# Patient Record
Sex: Male | Born: 1935 | Race: White | Hispanic: No | Smoking: Former smoker
Health system: Southern US, Community
[De-identification: ages and names within clinical notes are randomized; demographics above are authoritative.]

## PROBLEM LIST (undated history)

## (undated) DIAGNOSIS — J449 Chronic obstructive pulmonary disease, unspecified: Secondary | ICD-10-CM

## (undated) DIAGNOSIS — J849 Interstitial pulmonary disease, unspecified: Secondary | ICD-10-CM

## (undated) DIAGNOSIS — I10 Essential (primary) hypertension: Secondary | ICD-10-CM

## (undated) DIAGNOSIS — I639 Cerebral infarction, unspecified: Secondary | ICD-10-CM

## (undated) DIAGNOSIS — N400 Enlarged prostate without lower urinary tract symptoms: Secondary | ICD-10-CM

## (undated) DIAGNOSIS — E7800 Pure hypercholesterolemia, unspecified: Secondary | ICD-10-CM

## (undated) HISTORY — PX: HERNIA REPAIR: SHX51

## (undated) HISTORY — PX: CATARACT EXTRACTION: SUR2

## (undated) HISTORY — PX: APPENDECTOMY: SHX54

---

## 1998-06-25 ENCOUNTER — Other Ambulatory Visit: Admission: RE | Admit: 1998-06-25 | Discharge: 1998-06-25 | Payer: Self-pay | Admitting: Urology

## 1999-05-28 ENCOUNTER — Encounter: Admission: RE | Admit: 1999-05-28 | Discharge: 1999-05-28 | Payer: Self-pay | Admitting: Surgery

## 1999-05-28 ENCOUNTER — Encounter: Payer: Self-pay | Admitting: Surgery

## 1999-05-29 ENCOUNTER — Ambulatory Visit (HOSPITAL_BASED_OUTPATIENT_CLINIC_OR_DEPARTMENT_OTHER): Admission: RE | Admit: 1999-05-29 | Discharge: 1999-05-29 | Payer: Self-pay | Admitting: Surgery

## 2003-05-20 ENCOUNTER — Encounter: Admission: RE | Admit: 2003-05-20 | Discharge: 2003-05-20 | Payer: Self-pay | Admitting: Urology

## 2003-05-21 ENCOUNTER — Inpatient Hospital Stay (HOSPITAL_COMMUNITY): Admission: RE | Admit: 2003-05-21 | Discharge: 2003-05-23 | Payer: Self-pay | Admitting: Urology

## 2003-05-21 ENCOUNTER — Encounter (INDEPENDENT_AMBULATORY_CARE_PROVIDER_SITE_OTHER): Payer: Self-pay

## 2007-05-14 ENCOUNTER — Emergency Department (HOSPITAL_COMMUNITY): Admission: EM | Admit: 2007-05-14 | Discharge: 2007-05-14 | Payer: Self-pay | Admitting: Emergency Medicine

## 2007-05-15 ENCOUNTER — Emergency Department (HOSPITAL_COMMUNITY): Admission: EM | Admit: 2007-05-15 | Discharge: 2007-05-15 | Payer: Self-pay | Admitting: Emergency Medicine

## 2007-05-16 ENCOUNTER — Emergency Department (HOSPITAL_COMMUNITY): Admission: EM | Admit: 2007-05-16 | Discharge: 2007-05-16 | Payer: Self-pay | Admitting: Emergency Medicine

## 2007-05-17 ENCOUNTER — Inpatient Hospital Stay (HOSPITAL_COMMUNITY): Admission: EM | Admit: 2007-05-17 | Discharge: 2007-05-22 | Payer: Self-pay | Admitting: Emergency Medicine

## 2007-05-19 ENCOUNTER — Encounter (INDEPENDENT_AMBULATORY_CARE_PROVIDER_SITE_OTHER): Payer: Self-pay | Admitting: Internal Medicine

## 2007-07-12 ENCOUNTER — Encounter: Admission: RE | Admit: 2007-07-12 | Discharge: 2007-07-20 | Payer: Self-pay | Admitting: *Deleted

## 2007-07-25 ENCOUNTER — Encounter: Admission: RE | Admit: 2007-07-25 | Discharge: 2007-10-17 | Payer: Self-pay | Admitting: *Deleted

## 2007-11-21 ENCOUNTER — Encounter: Admission: RE | Admit: 2007-11-21 | Discharge: 2008-01-02 | Payer: Self-pay | Admitting: Neurology

## 2008-02-22 ENCOUNTER — Encounter: Admission: RE | Admit: 2008-02-22 | Discharge: 2008-04-03 | Payer: Self-pay | Admitting: Neurology

## 2009-01-27 ENCOUNTER — Encounter: Admission: RE | Admit: 2009-01-27 | Discharge: 2009-04-27 | Payer: Self-pay | Admitting: Family Medicine

## 2009-04-28 ENCOUNTER — Encounter: Admission: RE | Admit: 2009-04-28 | Discharge: 2009-05-12 | Payer: Self-pay | Admitting: Family Medicine

## 2010-09-23 ENCOUNTER — Ambulatory Visit: Payer: Medicare Other | Attending: Family Medicine

## 2010-09-23 DIAGNOSIS — M6281 Muscle weakness (generalized): Secondary | ICD-10-CM | POA: Insufficient documentation

## 2010-09-23 DIAGNOSIS — R262 Difficulty in walking, not elsewhere classified: Secondary | ICD-10-CM | POA: Insufficient documentation

## 2010-09-23 DIAGNOSIS — R269 Unspecified abnormalities of gait and mobility: Secondary | ICD-10-CM | POA: Insufficient documentation

## 2010-09-23 DIAGNOSIS — IMO0001 Reserved for inherently not codable concepts without codable children: Secondary | ICD-10-CM | POA: Insufficient documentation

## 2010-09-25 ENCOUNTER — Ambulatory Visit: Payer: Medicare Other

## 2010-09-30 ENCOUNTER — Ambulatory Visit: Payer: Medicare Other | Admitting: Physical Therapy

## 2010-10-02 ENCOUNTER — Ambulatory Visit: Payer: Medicare Other

## 2010-10-07 ENCOUNTER — Ambulatory Visit: Payer: Medicare Other | Admitting: Physical Therapy

## 2010-10-09 ENCOUNTER — Ambulatory Visit: Payer: Medicare Other

## 2010-10-13 ENCOUNTER — Ambulatory Visit: Payer: Medicare Other | Attending: Family Medicine

## 2010-10-13 DIAGNOSIS — R262 Difficulty in walking, not elsewhere classified: Secondary | ICD-10-CM | POA: Insufficient documentation

## 2010-10-13 DIAGNOSIS — IMO0001 Reserved for inherently not codable concepts without codable children: Secondary | ICD-10-CM | POA: Insufficient documentation

## 2010-10-13 DIAGNOSIS — R269 Unspecified abnormalities of gait and mobility: Secondary | ICD-10-CM | POA: Insufficient documentation

## 2010-10-13 DIAGNOSIS — M6281 Muscle weakness (generalized): Secondary | ICD-10-CM | POA: Insufficient documentation

## 2010-10-16 ENCOUNTER — Ambulatory Visit: Payer: Medicare Other | Admitting: Physical Therapy

## 2010-10-20 ENCOUNTER — Ambulatory Visit: Payer: Medicare Other | Admitting: Physical Therapy

## 2010-10-23 ENCOUNTER — Ambulatory Visit: Payer: Medicare Other | Admitting: Physical Therapy

## 2010-10-27 ENCOUNTER — Ambulatory Visit: Payer: Medicare Other | Admitting: Physical Therapy

## 2010-10-27 NOTE — H&P (Signed)
NAME:  Jim Contreras, Jim Contreras               ACCOUNT NO.:  [id number]   MEDICAL RECORD NO.:  [id number]          PATIENT TYPE:  INP   LOCATION:  1433                         FACILITY:  [facility]   PHYSICIAN:  Corinna L. Lendell Caprice, MDDATE OF BIRTH:  05-05-1924   DATE OF ADMISSION:  05/17/2007  DATE OF DISCHARGE:                              HISTORY & PHYSICAL   CHIEF COMPLAINT:  My legs gave out.   HPI:  Jim Contreras is a thin 77 year old white male who presents to the  emergency room via EMS with the above complaint.  He noted that he felt  weak when he woke up this morning.  He thinks that it was his right leg  that was weaker than his left.  He eased himself to the ground.  He  lives alone.  He has no history of stroke.  He reports that he got a  shot in Dr. Wanda Plump office and is currently on cephalexin.  He has  had a recent cystoscopy for hematuria and subsequently a Foley catheter  had to be placed for recurrent obstruction, reportedly secondary to  blood clots, according to the patient.  I have no office records.  The  patient denies any fevers, chills or cough.  He reports that his Foley  catheter was changed yesterday for a larger bore catheter by Dr.  Wanda Plump.  He has been eating and drinking well.  He is here with his  daughter.  He takes an aspirin a day.  He has no paresthesias.  No other  focal weakness.  No difficulty with speech.   PAST MEDICAL HISTORY:  1. BPH.  2. Recent cysto as above for hematuria.  3. Hypertension.   MEDICATIONS:  1. Cephalexin.  2. Hyzaar 100/12.5 mg a day.  3. Terazosin 10 mg a day.  4. Avodart  .  5. Aspirin 81 mg a day.  6. Fish oil capsules.   HE REPORTS AN ALLERGY TO PENICILLIN, SULFA AND NAPROSYN.   PAST SURGICAL HISTORY:  1. Appendectomy.  2. Some type of a prostate procedure which sounds like a TURP.   SOCIAL HISTORY:  He quit smoking in his 62s.  He lives alone.  He does  not have a history of heavy drinking or drugs.   FAMILY  HISTORY:  Negative for cancer, negative for stroke.   REVIEW OF SYSTEMS:  As above, otherwise negative.   Temperature is 99.1, blood pressure 144/63, pulse 87, respiratory rate  18, oxygen saturation 93% on room air.  GENERAL:  The patient is well-nourished, well-developed, in no acute  distress.  HEENT:  Normocephalic, atraumatic.  Pupils equal, round, and reactive to  light.  His face is symmetric.  Moist mucous membranes.  NECK:  Supple.  No carotid bruits, no JVD.  No thyromegaly.  LUNGS:  Clear to auscultation bilaterally without wheezes, rhonchi or  rales.  CARDIOVASCULAR:  Regular rate and rhythm without murmurs, gallops or  rubs.  ABDOMEN:  Soft, nontender, nondistended.  GU:  He has a leg bag attached to his Foley catheter draining pinkish  urine.  RECTAL:  Deferred.  EXTREMITIES:  No clubbing, cyanosis or edema.  NEUROLOGIC:  Cranial nerves are intact.  Motor strength 5/5 to my exam  but the ED physician felt that his right leg was weaker than his left.  The patient felt very dizzy when he sat up so I did not test his gait.  Deep tendon reflexes 2+.  Finger-to-nose normal.  SKIN:  No rash.  PSYCHIATRIC:  The patient is calm and cooperative with normal affect.   LABS:  White blood cell count is 18,000 with 93% neutrophils, 2%  lymphocytes, hemoglobin 12, hematocrit 34, platelet count 177.  PT/PTT  normal.  BUN is 29, creatinine 1.25, potassium 3.5, albumin 3, otherwise  unremarkable __________ .  CT of the brain shows no acute infarct.   ASSESSMENT AND PLAN:  1. Weakness with reported right leg weakness specifically:  The      patient will be admitted to telemetry.  I will get an MRI to rule      out stroke.  To my exam he does not have weakness, right greater      than left.  He appears just generally weak.  Also within the      differential is a sepsis, possibly a urinary source given his      recent Foley catheter and instrumentation as well as I suspect he      was  given antibiotic, maybe Rocephin.  I will check a UA C&S, also      get a PA and lateral chest x-ray.  I will start empirically with      Rocephin.  He will get physical therapy, occupational therapy.  I      will ask for office notes from Dr. Wanda Plump.  2. Hematuria:  For now hold Lovenox but I will give aspirin as he may      have suffered a stroke.  3. Benign prostatic hypertrophy and indwelling Foley that was recently      placed:  Continue Avodart and terazosin.  4. Hypertension:  Hold Hyzaar and for now.  5. Prerenal azotemia.  6. Leukocytosis, see above.      Corinna L. Lendell Caprice, MD  Electronically Signed     CLS/MEDQ  D:  05/17/2007  T:  05/18/2007  Job:  [id number]   cc:   Vikki Ports, M.D.  Fax: [phone]   [place] [employer], M.D.  Fax: [phone]

## 2010-10-27 NOTE — Discharge Summary (Signed)
NAME:  Jim Contreras, Jim Contreras               ACCOUNT NO.:  [id number]   MEDICAL RECORD NO.:  [id number]          PATIENT TYPE:  INP   LOCATION:  1433                         [facility]ACILITY:  [facility]   PHYSICIAN:  Kela Millin, M.D.DATE OF BIRTH:  1924/01/26   DATE OF ADMISSION:  05/17/2007  DATE OF DISCHARGE:  05/22/2007                               DISCHARGE SUMMARY   DISCHARGE DIAGNOSES:  1. Pseudomonas urinary tract infection with sepsis syndrome.  2. Transient atrial fibrillation - in sinus rhythm at the time of      discharge.  3. Hypertension.  4. Volume depletion/free renal azotemia - resolved with creatinine of      1.0 and BUN of 14 at the time of discharge.  5. History of hematuria - status post recent cystoscopy per Dr.      Wanda Plump.  6. History of benign prostatic hypertrophy.   PROCEDURE AND STUDIES:  1. A 2-D echocardiogram - overall left ventricular systolic function      normal, ejection fraction 65-70%.  No left ventricular regional      wall motion abnormalities.  LV wall thickness mildly increased.  2. 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  3. MRI of brain - atrophy and chronic small vessel disease.  No acute      or reversible process.   CONSULTATIONS:  Urology, Dr. Wanda Plump.   BRIEF HISTORY:  The patient is an 77 year old white male with the above-  listed medical problems who presented with complaints of weakness.  Initially in the ER he reported that he felt his right leg was weaker  than the left, and so a CT scan of the brain was done in the ER followed  by an MRI and the results as stated above.  It was also noted upon  admission that the patient had had a recent cystoscopy for hematuria and  subsequently a Foley catheter had been placed for recurrent obstruction  thought to be secondary to blood clots per patient's report.  A  urinalysis was done in the emergency room and it was  consistent with a  urinary tract infection.  He was admitted for further evaluation and  management.  He denied fevers, chills, cough.  He also denied  paresthesias and no difficulty with his speech.   Please see the full admission history and physical dictated on May 17, 2007 by Dr. Lendell Caprice for the details of the admission physical exam  as well as the laboratory data.   HOSPITAL COURSE:  #1  - PSEUDOMONAS URINARY TRACT INFECTION WITH SEPSIS  SYNDROME - upon admission the patient had a urinalysis done which was  turbid in appearance with positive nitrites and moderate leukocytes with  11-20 WBCs and many bacteria.  The patient was empirically started on IV  Rocephin after a urine culture was sent.  The urine grew Pseudomonas  that was pansensitive.  The patient has remained afebrile and it was  noted upon admission that he had a leukocytosis of 18.4 and this has  resolved - his last white cell count prior to discharge is 7.2.  The  patient has remained hemodynamically stable.  He has had no further  gross hematuria.  Urology has followed him in the hospital and she will  be discharged today on oral antibiotics.   #2 - TRANSIENT ATRIAL FIBRILLATION - on the patient's third hospital  day, his heart rate on the monitor was noted to increase to the 140s to  150s.  An EKG was done and it was consistent with atrial fibrillation  with rapid ventricular rhythm.  Serial cardiac enzymes were done and  these were negative for an MI.  A 2-D echocardiogram was also done and  the results as stated above.  Initially, the patient was started on  therapeutic dose of Lovenox as well as Cardizem drip.  With this  intervention, the patient spontaneously converted to normal sinus rhythm  while on the Cardizem drip, less than 12 hours after onset.  The IV  Cardizem was discontinued and the patient maintained on oral Cardizem.  He has remained asymptomatic and in normal sinus rhythm the rest of his   hospital stay.  I discussed the patient with Cardiology, and they stated  that this episode was in the setting of an acute illness - UTI with  sepsis syndrome as above, that chronic anticoagulation with Coumadin  would not be recommended at this time, but that the patient should  follow up in the Cardiology Clinic for further monitoring as  appropriate, and if the patient should go back into atrial fibrillation  Coumadin would be considered at that time.  The patient is to follow up  at the [facility].  He is discharged on oral Cardizem and  aspirin.   #3 - ACUTE RENAL AZOTEMIA/VOLUME DEPLETION - the patient was hydrated  during his hospital stay, resolved.   #4 - HISTORY OF HEMATURIA - per Neurology, Dr. Wanda Plump saw the patient  in the hospital and a voiding trial was done today, May 22, 2007,  and the patient has voided without difficulty.  He has not had any  further gross hematuria.  Dr. Wanda Plump today indicated that the patient  is to follow up with him in one week and a repeat cystoscopy to be done  at that time.   #5 - BPH - patient to continue his preadmission medications.   DISCHARGE MEDICATIONS:  1. Cardizem the CD 120 mg p.o. daily.  2. Cipro 250 mg p.o. b.i.d. times 10 days.  3. Hyzaar 100 mg p.o. daily.  4. HCTZ discontinued.  5. Aspirin 81 mg p.o. daily.  6. The patient to continue his terazosin and Avodart as previously.   FOLLOW-UP CARE:  1. Dr. Theresia Lo in 1-2 weeks.  2. [facility]/Dr. Eldridge Dace, patient to call for an appointment      upon discharge.  3. Dr. Wanda Plump in one week.   DISCHARGE SUMMARY:  Condition - improved.  Stable.      Kela Millin, M.D.  Electronically Signed     ACV/MEDQ  D:  05/22/2007  T:  05/22/2007  Job:  [id number]   cc:   Vikki Ports, M.D.  Fax: [phone]   Corky Crafts, MD  Fax: [phone]   [place] [employer], M.D.  Fax: [phone]

## 2010-10-29 ENCOUNTER — Ambulatory Visit: Payer: Medicare Other

## 2010-10-30 NOTE — Op Note (Signed)
NAME:  Jim Contreras, Jim Contreras                         ACCOUNT NO.:  [id number]   MEDICAL RECORD NO.:  [id number]                   PATIENT TYPE:  INP   LOCATION:  0371                                 FACILITY:  [facility]   PHYSICIAN:  [place] Service, M.D.             DATE OF BIRTH:  [birth year]   DATE OF PROCEDURE:  05/21/2003  DATE OF DISCHARGE:                                 OPERATIVE REPORT   LMD:  Al Decant. Janey Greaser, MD   UROLOGIST:  [place] Service, M.D.   PREOPERATIVE DIAGNOSES:  A 77 year old white male progressively enlarging  right hydrocele, symptomatic benign prostatic hypertrophy with episodes of  retention, cystoscopy confirms coapting lateral lobes of the prostate.   POSTOPERATIVE DIAGNOSES:  Same.   PROCEDURE:  Right hydrocelectomy, transurethral resection of prostate.   ANESTHESIA:  General.   DRAINS:  24 French Foley.   SPECIMENS:  TUR chips.   DESCRIPTION OF PROCEDURE:  The patient was prepped and draped in the dorsal  lithotomy position after institution of an adequate level of general  anesthesia.  A transverse incision was made across the anterior aspect of  the right hemiscrotum through the skin and dartos. Gentle pressure on the  wound edges produced a pearl gray hydrocele cavity which was incised  longitudinally and drained of about 200 mL of straw colored fluid.  The sac  was everted on itself, sewn back in place with 2-0 Chromic stitches,  replaced within the right hemiscrotum.  The scrotum was closed in three  layers, deep muscular layers closed with running suture of 2-0 Vicryl,  superficial muscular layers closed with a running suture of 2-0 Vicryl, skin  closed with interrupted stitches of 2-0 chromic.  The patient was then  repositioned for the TURP.  The urethra was dilated using [employer] sounds.  Careful inspection of the urethra, sphincter, prostatic urethra and bladder  with the 21 French panendoscope showed a densely trabeculated bladder,  short  prostatic urethra with coapting lateral lobes, orifices were well away from  the prostate.  The resectoscope sheath was inserted, resection was begun  with a single furrow at the 6 o'clock position to allow free efflux of  chips.  Resection was then begun at the 10 o'clock position and carried down  to the 6 o'clock position and begun again at the 2 o'clock position and  carried down to the 6 o'clock position. A small amount of tissue was  resected anteriorly and no caps or perforations were noted.  Once resection  had been carried down to the level of the prostatic capsule, chips were  irrigated free from the bladder, ___________ were placed with the VaporTrode  element which was used to obtain adequate hemostasis within the prostatic  urethra.  A small shelf of tissue had been created at the bladder neck, it  was gently incised using the VaporTrode element at the 5 and 8 o'clock  position taking care  to avoid injury to the distal ureter or trigone.  The  bladder was filled to capacity, resectoscope sheath was  withdrawn, 24 French three-way Foley catheter was inserted with immediate  return of several hundred mL of pinked tinged irrigant.  The Foley was left  to straight drain and the patient was returned to recovery in satisfactory  condition after being given a B&O suppository.                                               [place] Service, M.D.    RH/MEDQ  D:  05/21/2003  T:  05/21/2003  Job:  [id number]

## 2010-12-01 ENCOUNTER — Encounter: Payer: Medicare Other | Admitting: Physical Therapy

## 2011-03-01 ENCOUNTER — Other Ambulatory Visit: Payer: Self-pay | Admitting: Family Medicine

## 2011-03-01 DIAGNOSIS — R2681 Unsteadiness on feet: Secondary | ICD-10-CM

## 2011-03-01 DIAGNOSIS — R42 Dizziness and giddiness: Secondary | ICD-10-CM

## 2011-03-04 ENCOUNTER — Ambulatory Visit
Admission: RE | Admit: 2011-03-04 | Discharge: 2011-03-04 | Disposition: A | Discharge status: Home / Self Care | Payer: Medicare Other | Source: Ambulatory Visit | Attending: Family Medicine | Admitting: Family Medicine

## 2011-03-04 DIAGNOSIS — R42 Dizziness and giddiness: Secondary | ICD-10-CM

## 2011-03-04 DIAGNOSIS — R2681 Unsteadiness on feet: Secondary | ICD-10-CM

## 2011-03-22 LAB — CULTURE, BLOOD (ROUTINE X 2)
Culture: NO GROWTH
Culture: NO GROWTH

## 2011-03-22 LAB — DIFFERENTIAL
Basophils Absolute: 0
Basophils Relative: 0
Basophils Relative: 0
Eosinophils Absolute: 0 — ABNORMAL LOW
Eosinophils Absolute: 0.4
Eosinophils Absolute: 0.5
Eosinophils Relative: 0
Eosinophils Relative: 4
Eosinophils Relative: 6 — ABNORMAL HIGH
Lymphocytes Relative: 11 — ABNORMAL LOW
Lymphocytes Relative: 13
Lymphocytes Relative: 6 — ABNORMAL LOW
Lymphs Abs: 0.9
Monocytes Absolute: 0.9
Monocytes Absolute: 1
Neutro Abs: 17.1 — ABNORMAL HIGH
Neutro Abs: 6.6
Neutro Abs: 9.2 — ABNORMAL HIGH
Neutrophils Relative %: 68
Neutrophils Relative %: 74
Neutrophils Relative %: 81 — ABNORMAL HIGH

## 2011-03-22 LAB — CBC
HCT: 30.9 — ABNORMAL LOW
MCHC: 34.6
MCHC: 35.3
MCV: 85.6
MCV: 86.6
MCV: 86.9
Platelets: 174
Platelets: 189
RBC: 3.44 — ABNORMAL LOW
RBC: 3.56 — ABNORMAL LOW
RBC: 4.06 — ABNORMAL LOW
WBC: 11.3 — ABNORMAL HIGH
WBC: 18.4 — ABNORMAL HIGH
WBC: 6.5
WBC: 7.2
WBC: 9

## 2011-03-22 LAB — URINALYSIS, ROUTINE W REFLEX MICROSCOPIC
Glucose, UA: NEGATIVE
Urobilinogen, UA: 0.2

## 2011-03-22 LAB — CARDIAC PANEL(CRET KIN+CKTOT+MB+TROPI)
Relative Index: 1.4
Total CK: 183
Troponin I: 0.04

## 2011-03-22 LAB — BASIC METABOLIC PANEL
BUN: 13
BUN: 18
CO2: 24
CO2: 25
CO2: 26
Calcium: 7.6 — ABNORMAL LOW
Calcium: 7.8 — ABNORMAL LOW
Chloride: 108
Chloride: 111
Creatinine, Ser: 0.9
Creatinine, Ser: 1
Creatinine, Ser: 1.03
GFR calc Af Amer: >60
GFR calc Af Amer: >60
GFR calc non Af Amer: >60
Potassium: 3.7
Potassium: 3.8
Sodium: 140

## 2011-03-22 LAB — COMPREHENSIVE METABOLIC PANEL
Albumin: 3 — ABNORMAL LOW
Calcium: 9.2
Glucose, Bld: 135 — ABNORMAL HIGH
Potassium: 3.5

## 2011-03-22 LAB — PROTIME-INR: Prothrombin Time: 15

## 2011-03-22 LAB — URINE MICROSCOPIC-ADD ON

## 2011-03-22 LAB — MAGNESIUM: Magnesium: 2.2

## 2011-03-23 LAB — COMPREHENSIVE METABOLIC PANEL
ALT: 20
AST: 24
Albumin: 3.4 — ABNORMAL LOW
Alkaline Phosphatase: 56
CO2: 32
Chloride: 104
Creatinine, Ser: 1.13
GFR calc Af Amer: >60
GFR calc non Af Amer: >60
Potassium: 3.4 — ABNORMAL LOW
Sodium: 141
Total Bilirubin: 0.7

## 2011-03-23 LAB — DIFFERENTIAL
Basophils Absolute: 0
Basophils Relative: 0
Eosinophils Absolute: 0.2
Eosinophils Relative: 4
Monocytes Absolute: 0.6

## 2011-03-23 LAB — URINE MICROSCOPIC-ADD ON

## 2011-03-23 LAB — URINALYSIS, ROUTINE W REFLEX MICROSCOPIC
Glucose, UA: NEGATIVE
Ketones, ur: NEGATIVE
Leukocytes, UA: NEGATIVE
Specific Gravity, Urine: 1.027
pH: 7

## 2011-03-23 LAB — CBC
MCV: 86.5
RBC: 4.85
WBC: 6.7

## 2011-06-11 ENCOUNTER — Emergency Department (HOSPITAL_COMMUNITY)
Admission: EM | Admit: 2011-06-11 | Discharge: 2011-06-11 | Disposition: A | Discharge status: Home / Self Care | Payer: Medicare Other | Attending: Emergency Medicine | Admitting: Emergency Medicine

## 2011-06-11 ENCOUNTER — Emergency Department (HOSPITAL_COMMUNITY): Payer: Medicare Other

## 2011-06-11 ENCOUNTER — Other Ambulatory Visit: Payer: Self-pay

## 2011-06-11 ENCOUNTER — Encounter: Payer: Self-pay | Admitting: *Deleted

## 2011-06-11 DIAGNOSIS — Z79899 Other long term (current) drug therapy: Secondary | ICD-10-CM | POA: Insufficient documentation

## 2011-06-11 DIAGNOSIS — I10 Essential (primary) hypertension: Secondary | ICD-10-CM | POA: Insufficient documentation

## 2011-06-11 DIAGNOSIS — R42 Dizziness and giddiness: Secondary | ICD-10-CM | POA: Insufficient documentation

## 2011-06-11 DIAGNOSIS — R197 Diarrhea, unspecified: Secondary | ICD-10-CM | POA: Insufficient documentation

## 2011-06-11 DIAGNOSIS — R112 Nausea with vomiting, unspecified: Secondary | ICD-10-CM | POA: Insufficient documentation

## 2011-06-11 DIAGNOSIS — E7800 Pure hypercholesterolemia, unspecified: Secondary | ICD-10-CM | POA: Insufficient documentation

## 2011-06-11 HISTORY — DX: Essential (primary) hypertension: I10

## 2011-06-11 HISTORY — DX: Pure hypercholesterolemia, unspecified: E78.00

## 2011-06-11 LAB — DIFFERENTIAL
Basophils Absolute: 0 10*3/uL (ref 0.0–0.1)
Basophils Relative: 0 % (ref 0–1)
Eosinophils Relative: 0 % (ref 0–5)
Lymphocytes Relative: 4 % — ABNORMAL LOW (ref 12–46)
Monocytes Absolute: 1.1 10*3/uL — ABNORMAL HIGH (ref 0.1–1.0)
Neutro Abs: 7.9 10*3/uL — ABNORMAL HIGH (ref 1.7–7.7)

## 2011-06-11 LAB — BASIC METABOLIC PANEL
CO2: 26 mEq/L (ref 19–32)
Calcium: 9.3 mg/dL (ref 8.4–10.5)
Chloride: 100 mEq/L (ref 96–112)
Creatinine, Ser: 0.89 mg/dL (ref 0.50–1.35)
GFR calc Af Amer: 88 mL/min — ABNORMAL LOW (ref >90)
Sodium: 138 mEq/L (ref 135–145)

## 2011-06-11 LAB — CBC
MCHC: 34.3 g/dL (ref 30.0–36.0)
Platelets: 200 10*3/uL (ref 150–400)
RDW: 14.3 % (ref 11.5–15.5)
WBC: 9.4 10*3/uL (ref 4.0–10.5)

## 2011-06-11 MED ORDER — SODIUM CHLORIDE 0.9 % IV SOLN
Freq: Once | INTRAVENOUS | Status: AC
  Administered 2011-06-11: 500 mL via INTRAVENOUS

## 2011-06-11 MED ORDER — GI COCKTAIL ~~LOC~~
30.0000 mL | Freq: Once | ORAL | Status: AC
  Administered 2011-06-11: 30 mL via ORAL
  Filled 2011-06-11: qty 30

## 2011-06-11 MED ORDER — MECLIZINE HCL 25 MG PO TABS
25.0000 mg | ORAL_TABLET | Freq: Once | ORAL | Status: AC
  Administered 2011-06-11: 25 mg via ORAL
  Filled 2011-06-11: qty 1

## 2011-06-11 MED ORDER — MECLIZINE HCL 25 MG PO TABS: 25.0000 mg | ORAL_TABLET | Freq: Three times a day (TID) | ORAL | Status: AC | PRN

## 2011-06-11 MED ORDER — ONDANSETRON HCL 4 MG/2ML IJ SOLN
INTRAMUSCULAR | Status: AC
  Administered 2011-06-11: 13:00:00
  Filled 2011-06-11: qty 2

## 2011-06-11 MED ORDER — ONDANSETRON HCL 4 MG/2ML IJ SOLN
4.0000 mg | Freq: Once | INTRAMUSCULAR | Status: AC
  Administered 2011-06-11: 4 mg via INTRAVENOUS
  Filled 2011-06-11: qty 2

## 2011-06-11 MED ORDER — BACITRACIN ZINC 500 UNIT/GM EX OINT
TOPICAL_OINTMENT | CUTANEOUS | Status: AC
  Administered 2011-06-11: 1
  Filled 2011-06-11: qty 0.9

## 2011-06-11 NOTE — ED Notes (Signed)
Freida Busman, EDP reviewed pts CT results. Informed RN and pt that pt was clear to be discharge per original d/c orders by Judd Lien, MD.

## 2011-06-11 NOTE — ED Notes (Signed)
Pt reports vertigo beginning at 8pm last night. Hx of same. Sts when he turns to right, he vomits. Ems noted fecal incontinence at pts home, formed stool. Pt reports normally continent.

## 2011-06-11 NOTE — ED Notes (Signed)
Pt received approx NS per ems PTA.

## 2011-06-11 NOTE — ED Notes (Signed)
Pt reports that he suffers from vertigo and usually has an episode once yearly; reports began experiencing the "room spinning" last night at about 8PM with nausea and vomitting; reports has never had an episode this severe; denies s/s at this time following zofran by ems

## 2011-06-11 NOTE — ED Notes (Signed)
WUJ:WJ19<JY> Expected date:06/11/11<BR> Expected time:12:43 PM<BR> Means of arrival:Ambulance<BR> Comments:<BR> EMS 90 GC, 87 yom n/v/d

## 2011-06-11 NOTE — ED Provider Notes (Signed)
Patient had been seen by Dr. Judd Lien, and had been discharged. Patient fell and hit his head against the wall walker to ambulate. He sustained an abrasion to his occiput. Denies any neck pain no loss of consciousness. Will obtain head CT  6:55 PM Patient's CT of his head and neck reviewed. No acute injuries will discharge to home  Toy Baker, MD 06/11/11 [phone]

## 2011-06-11 NOTE — ED Notes (Signed)
Patient requested something to water. Unable to give patient water, physician has not seen patient

## 2011-06-11 NOTE — ED Notes (Addendum)
Dr. Freida Busman at bedside. Cleared pt of obvious neck and back injury. Pt moved to sitting position then to stretcher with assistance x 3. Orders obtained from Dr. Freida Busman for head CT. Pt continues to deny head, neck, back pain. Remains a&o x 4. Abrasion noted to back of head, dressed with bacitracin and 2x2 gauze per verbal order of Dr. Freida Busman.

## 2011-06-11 NOTE — ED Notes (Signed)
Nurse had left room with son remaining at bedside in order to obtain a wheelchair to discharge pt home. Advised pt and son that was going to obtain wheelchair and to remain seated until I returned with wheelchair. Upon entry to room with wheelchair, pt found to be lying in the floor with son and Rolly Salter, Charity fundraiser at bedside. Son states "He got up off the end of the stretcher and grabbed the sink and then lost his balance and fell back onto the floor and hit his head." Pt A&O x 4, denies head, neck and back pain at this time. MD notified for pt assessment.

## 2011-06-11 NOTE — ED Provider Notes (Signed)
History     CSN: [id number]  Arrival date & time 06/11/11  1301   First MD Initiated Contact with Patient 06/11/11 1400      Chief Complaint  Patient presents with  . Nausea  . Emesis  . Diarrhea  . Dizziness    (Consider location/radiation/quality/duration/timing/severity/associated sxs/prior treatment) HPI Comments: Patient with history of recurrent vertigo.  Has had this happen several times in the past.  Recently had an mri in the past few weeks which was okay.  Denies injury or fall.  No trauma.    Patient is a 77 y.o. male presenting with vomiting and diarrhea. The history is provided by the patient and a relative.  Emesis  This is a recurrent problem. The current episode started yesterday. The problem has been gradually worsening. There has been no fever. Associated symptoms include diarrhea.  Diarrhea The primary symptoms include vomiting and diarrhea.    Past Medical History  Diagnosis Date  . Hypertension   . Glaucoma   . Hypercholesteremia     No past surgical history on file.  No family history on file.  History  Substance Use Topics  . Smoking status: Not on file  . Smokeless tobacco: Not on file  . Alcohol Use:       Review of Systems  Gastrointestinal: Positive for vomiting and diarrhea.  All other systems reviewed and are negative.    Allergies  Floxin; Penicillins; and Sulfa antibiotics  Home Medications   Current Outpatient Rx  Name Route Sig Dispense Refill  . AMLODIPINE BESYLATE 10 MG PO TABS Oral Take 10 mg by mouth daily.      . ATORVASTATIN CALCIUM 10 MG PO TABS Oral Take 10 mg by mouth daily.      Marland Kitchen HYDROCHLOROTHIAZIDE 25 MG PO TABS Oral Take 25 mg by mouth daily.      Marland Kitchen LOSARTAN POTASSIUM 50 MG PO TABS Oral Take 50 mg by mouth daily.      Marland Kitchen SOLIFENACIN SUCCINATE 5 MG PO TABS Oral Take 10 mg by mouth daily.      Marland Kitchen ZOLPIDEM TARTRATE 10 MG PO TABS Oral Take 10 mg by mouth at bedtime as needed.        BP 144/73  Pulse 78   Temp(Src) 98.5 F (36.9 C) (Oral)  SpO2 98%  Physical Exam  Constitutional: He is oriented to person, place, and time. He appears well-developed and well-nourished. No distress.  HENT:  Head: Normocephalic and atraumatic.  Right Ear: External ear normal.  Left Ear: External ear normal.  Mouth/Throat: Oropharynx is clear and moist.  Eyes: EOM are normal. Pupils are equal, round, and reactive to light.       No nystagmus  Neck: Normal range of motion. Neck supple.  Cardiovascular: Regular rhythm.   No murmur heard. Pulmonary/Chest: Effort normal and breath sounds normal. No respiratory distress.  Abdominal: Soft. Bowel sounds are normal. He exhibits no distension. There is no tenderness.  Musculoskeletal: Normal range of motion. He exhibits no edema.  Neurological: He is alert and oriented to person, place, and time. No cranial nerve deficit. Coordination normal.  Skin: Skin is warm and dry. He is not diaphoretic.    ED Course  Procedures (including critical care time)   Labs Reviewed  CBC  DIFFERENTIAL  BASIC METABOLIC PANEL  BASIC METABOLIC PANEL   No results found.   No diagnosis found.   Date: 06/11/2011  Rate: 70  Rhythm: normal sinus rhythm  QRS Axis: normal  Intervals: normal  ST/T Wave abnormalities: normal  Conduction Disutrbances:none  Narrative Interpretation:   Old EKG Reviewed: unchanged    MDM  Labs and ekg are okay.  The patient was hydrated, given zofran and meclizine.  Seems to be feeling better.  Will discharge with meclizine.        Geoffery Lyons, MD 06/11/11 [phone]

## 2012-02-10 ENCOUNTER — Ambulatory Visit: Payer: Medicare Other | Attending: Family Medicine

## 2012-02-10 DIAGNOSIS — M6281 Muscle weakness (generalized): Secondary | ICD-10-CM | POA: Insufficient documentation

## 2012-02-10 DIAGNOSIS — IMO0001 Reserved for inherently not codable concepts without codable children: Secondary | ICD-10-CM | POA: Insufficient documentation

## 2012-02-10 DIAGNOSIS — R262 Difficulty in walking, not elsewhere classified: Secondary | ICD-10-CM | POA: Insufficient documentation

## 2012-02-16 ENCOUNTER — Ambulatory Visit: Payer: Medicare Other | Attending: Family Medicine | Admitting: Physical Therapy

## 2012-02-16 DIAGNOSIS — IMO0001 Reserved for inherently not codable concepts without codable children: Secondary | ICD-10-CM | POA: Insufficient documentation

## 2012-02-16 DIAGNOSIS — M6281 Muscle weakness (generalized): Secondary | ICD-10-CM | POA: Insufficient documentation

## 2012-02-16 DIAGNOSIS — R262 Difficulty in walking, not elsewhere classified: Secondary | ICD-10-CM | POA: Insufficient documentation

## 2012-02-17 ENCOUNTER — Ambulatory Visit: Payer: Medicare Other

## 2012-02-21 ENCOUNTER — Ambulatory Visit: Payer: Medicare Other | Admitting: Physical Therapy

## 2012-02-23 ENCOUNTER — Ambulatory Visit: Payer: Medicare Other | Admitting: Physical Therapy

## 2012-02-28 ENCOUNTER — Ambulatory Visit: Payer: Medicare Other | Admitting: Physical Therapy

## 2012-03-01 ENCOUNTER — Ambulatory Visit: Payer: Medicare Other

## 2012-03-06 ENCOUNTER — Ambulatory Visit: Payer: Medicare Other

## 2012-03-08 ENCOUNTER — Ambulatory Visit: Payer: Medicare Other

## 2012-03-09 ENCOUNTER — Other Ambulatory Visit: Payer: Self-pay

## 2012-03-09 ENCOUNTER — Ambulatory Visit
Admission: RE | Admit: 2012-03-09 | Discharge: 2012-03-09 | Disposition: A | Discharge status: Home / Self Care | Payer: Medicare Other | Source: Ambulatory Visit

## 2012-03-09 DIAGNOSIS — R0602 Shortness of breath: Secondary | ICD-10-CM

## 2012-03-09 DIAGNOSIS — I10 Essential (primary) hypertension: Secondary | ICD-10-CM

## 2012-03-13 ENCOUNTER — Ambulatory Visit: Payer: Medicare Other

## 2012-03-15 ENCOUNTER — Ambulatory Visit: Payer: Medicare Other | Attending: Family Medicine

## 2012-03-15 DIAGNOSIS — M6281 Muscle weakness (generalized): Secondary | ICD-10-CM | POA: Insufficient documentation

## 2012-03-15 DIAGNOSIS — IMO0001 Reserved for inherently not codable concepts without codable children: Secondary | ICD-10-CM | POA: Insufficient documentation

## 2012-03-15 DIAGNOSIS — R262 Difficulty in walking, not elsewhere classified: Secondary | ICD-10-CM | POA: Insufficient documentation

## 2012-03-16 ENCOUNTER — Institutional Professional Consult (permissible substitution): Payer: Medicare Other | Admitting: Internal Medicine

## 2012-03-23 ENCOUNTER — Ambulatory Visit (INDEPENDENT_AMBULATORY_CARE_PROVIDER_SITE_OTHER): Payer: Medicare Other | Admitting: Pulmonary Disease

## 2012-03-23 ENCOUNTER — Other Ambulatory Visit: Payer: Self-pay | Admitting: Pulmonary Disease

## 2012-03-23 ENCOUNTER — Encounter: Payer: Self-pay | Admitting: Pulmonary Disease

## 2012-03-23 VITALS — BP 140/64 | HR 71 | Temp 99.3°F | Ht 67.0 in | Wt 160.0 lb

## 2012-03-23 DIAGNOSIS — J449 Chronic obstructive pulmonary disease, unspecified: Secondary | ICD-10-CM

## 2012-03-23 DIAGNOSIS — R0989 Other specified symptoms and signs involving the circulatory and respiratory systems: Secondary | ICD-10-CM

## 2012-03-23 DIAGNOSIS — R0600 Dyspnea, unspecified: Secondary | ICD-10-CM

## 2012-03-23 DIAGNOSIS — I6350 Cerebral infarction due to unspecified occlusion or stenosis of unspecified cerebral artery: Secondary | ICD-10-CM

## 2012-03-23 DIAGNOSIS — I639 Cerebral infarction, unspecified: Secondary | ICD-10-CM

## 2012-03-23 DIAGNOSIS — G319 Degenerative disease of nervous system, unspecified: Secondary | ICD-10-CM

## 2012-03-23 MED ORDER — SPACER/AERO CHAMBER MOUTHPIECE MISC: 1.0000 | Freq: Once | Status: DC

## 2012-03-23 MED ORDER — ALBUTEROL SULFATE HFA 108 (90 BASE) MCG/ACT IN AERS: 2.0000 | INHALATION_SPRAY | Freq: Four times a day (QID) | RESPIRATORY_TRACT | Status: DC | PRN

## 2012-03-23 NOTE — Progress Notes (Signed)
Subjective:    Patient ID: Jim Contreras, male    DOB: 06/17/1923, 78 y.o.   MRN: [id number]  78 yo referred as a new consult for dyspnea and abnormal CXR by Dr. Docia Chuck   HPI  78 yo gentleman with per his report, no significant history of respiratory disease presents to the clinic for dyspnea at rest and with exertion. He denies chest pain, fever, chills, PND or LE edema; admits coughing up white yellow sputum and wheezing from time to time. Denies previous episodes of bronchitis, hospitalizations or ED visits for respiratory distress. Has not been on prednisone for respiratory distress.    Past Medical History  Diagnosis Date  . Hypertension   . Glaucoma(365)   . Hypercholesteremia    No past surgical history on file.  No family history on file.  Allergies  Allergen Reactions  . Floxin (Ofloxacin) Other (See Comments)    Lost feeling in feet  . Penicillins Other (See Comments)    Ulcers in mouth  . Sulfa Antibiotics Other (See Comments)    Ulcers in mouth   Current Outpatient Prescriptions  Medication Sig Dispense Refill  . amLODipine (NORVASC) 10 MG tablet Take 5 mg by mouth daily.       Marland Kitchen atorvastatin (LIPITOR) 10 MG tablet Take 10 mg by mouth daily.        Marland Kitchen dutasteride (AVODART) 0.5 MG capsule Take 0.5 mg by mouth daily.      Marland Kitchen losartan (COZAAR) 50 MG tablet Take 50 mg by mouth daily.        . Tamsulosin HCl (FLOMAX) 0.4 MG CAPS Take 0.4 mg by mouth daily.      Marland Kitchen albuterol (PROVENTIL HFA;VENTOLIN HFA) 108 (90 BASE) MCG/ACT inhaler Inhale 2 puffs into the lungs every 6 (six) hours as needed for wheezing.  1 Inhaler  6  . Spacer/Aero Chamber Mouthpiece MISC 1 applicator by Does not apply route once.  1 each  1  . zolpidem (AMBIEN) 10 MG tablet Take 10 mg by mouth at bedtime as needed.         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  HENT: Negative for hearing loss, ear pain, nosebleeds, congestion, sore throat, facial swelling, rhinorrhea, sneezing, mouth sores, trouble swallowing, neck pain, neck stiffness, dental problem, voice change, postnasal drip, sinus pressure, tinnitus and ear discharge.   Eyes: Negative for visual disturbance.  Respiratory: Positive for cough, shortness of breath and wheezing. Negative for choking and chest tightness.   Cardiovascular: Negative for chest pain and palpitations.  Gastrointestinal: Negative for nausea, vomiting, abdominal pain, constipation, blood in stool and abdominal distention.  Genitourinary: Negative for difficulty urinating.  Musculoskeletal: Negative for myalgias, back pain, joint swelling, arthralgias and gait problem.  Skin: Negative for color change, pallor and rash.  Neurological: Negative for dizziness, tremors, weakness, light-headedness and headaches.  Hematological: Does not bruise/bleed easily.  Psychiatric/Behavioral: Negative for confusion, disturbed wake/sleep cycle and agitation. The patient is not nervous/anxious.        Objective:   Physical Exam BP 140/64  Pulse 71  Temp 99.3 F (37.4 C) (Oral)  Ht 5\' 7"  (1.702 m)  Wt 160 lb (72.576 kg)  BMI 25.06 kg/m2  SpO2 91% General: Comfortable HEENT ; pupils round and reactive to light; no nasal mucosa edema and  erythema; oropharynx with no erythema, edema or exudate; no cervical LAD  Cardiovascular: s1s2, no murmurs. Regular rate and rhythm.  Respiratory: Decreased breath sounds bilaterally with no wheezing, rhonchi or crackles; No increased work of breathing.  Abdomen: soft NT ND BS+.   Extremities: no pedal edema. Homans negative  Central nervous system: Alert and oriented. He does have balance problems falling to the R with ambulation; walks with a walker; has no overt focal neurologic  defficits Skin no rash  Musculoskeletal decreased muscle tone;    2 D echo 05/19/2007  SUMMARY - Overall left ventricular systolic function was normal. Left ventricular ejection fraction was estimated , range being 65 % to 70 %. There were no left ventricular regional wall motion abnormalities. Left ventricular wall thickness was mildly increased. - The aortic valve was mildly calcified. There was trivial aortic valvular regurgitation. - Mean transmitral gradient was 3 mmHg. Mitral valve area by pressure half-time was 5 cm^2. - The estimated peak right ventricular systolic pressure was mild to moderately increased. Estimated peak right ventricular systolic pressure was in the range of 35 mmHg to 45 mmHg.  CT chest 2000 IMPRESSION:  1. THE PROCESS DESCRIBED IN THE RIGHT UPPER LOBE IS CONSISTENT WITH SCARRING. SINCE WE HAVE NO  PRIOR FILMS TO DOCUMENT STABILITY OF THIS PROCESS I WOULD RECOMMEND A FOLLOW-UP PA AND LATERAL  CHEST FILM IN APPROXIMATELY 3 MONTHS.  2. MODERATE EMPHYSEMA IN BOTH UPPER LUNG ZONES.        CXR 03/09/2012 personally reviewed The patient has chronic interstitial and obstructive lung  disease with blebs at the right apex. No acute infiltrates or  effusions. Heart size and vascularity are normal. No acute  osseous abnormality.  MRI 03/04/2011  Generalized atrophy. Mild to moderate ventricular  enlargement, unchanged from the prior study. Chronic ischemic  changes in the white matter bilaterally. Chronic infarct in the  left thalamus and in the pons bilaterally, with progression from  the prior MRI. Negative for acute infarct. Negative for  hemorrhage or mass lesion.   Spirometry 03/23/12 FVC 3.03 L 81% FEV1 1.77L 64%  FEV1/FVC 58     Assessment & Plan:   Abnormal CXR  We will obtain a CT chest for further evaluation.   Dyspnea  Due to a combination of COPD, possible restrictive lung disease ? IPF and LVH, plus minus pulmonary hypertension. We  will obtain a CT chest for further evaluation and repeat a 2 D echocardiogram.   LVH ? Would recommend follow up with cardiology if evidence of LVH on recent echo. LV wall thickness increased on previous study 2008.  COPD moderate  See above spirometry; given significant dyspnea will initiate Albuterol and Spiriva. Given history of glaucoma we will closely monitor for manifestations of worsening eye pressure. The patient states that his opthalmologist keeps a close eye on his intraocular pressures and has not had recent problems. I also advised about urinary retention side effects and advised to stop the Spiriva if he develops problems urinating.  Walk for desaturation performed in clinic 03/23/2012; no need for supplemental oxygen with ambulation.    Previous evidence of chronic thalamic strokes and moderate ventricular enlargement. The patient complains of gait instability and vertigo limiting his exercise ability. I would like him to enrol in pulmonary rehabilitation however I am concerned of these previous abnormalities on the MRI. I think it would be beneficial for him to follow up with a neurologist if not already done so in the past.  General health: he needs the flu and pneumococcus vaccine. We will give with next visit, or he can receive it earlier if seen in his PCP office.   Vanetta Mulders, MD  [facility]  [place], [place]

## 2012-03-23 NOTE — Patient Instructions (Addendum)
We will obtain a CT of your chest to evaluate in detail the abnormalities seen on your CXR.   The previous US of your heart was abnormal, but it was done in 2008, we will repeat an US of the heart.   I would recommend further evalaution by neurology.   You have COPD so inhalers might help with your dyspnea. Please start albuterol as needed and Spiriva daily.   We will see you back in 4-6 weeks to discuss the test results and the response to therapy.

## 2012-03-24 DIAGNOSIS — G319 Degenerative disease of nervous system, unspecified: Secondary | ICD-10-CM | POA: Insufficient documentation

## 2012-03-24 DIAGNOSIS — J439 Emphysema, unspecified: Secondary | ICD-10-CM | POA: Insufficient documentation

## 2012-03-24 DIAGNOSIS — R0600 Dyspnea, unspecified: Secondary | ICD-10-CM | POA: Insufficient documentation

## 2012-03-24 DIAGNOSIS — I639 Cerebral infarction, unspecified: Secondary | ICD-10-CM | POA: Insufficient documentation

## 2012-03-29 ENCOUNTER — Telehealth: Payer: Self-pay | Admitting: Pulmonary Disease

## 2012-03-29 NOTE — Telephone Encounter (Signed)
I spoke with pt and he wanted clarification on how to use his spiriva. He stated he forgot if he needed to change the capsule daily or if the capsule is good for couple days. I advised pt no he is suppose to change capsules daily in his inhaler. He voiced his understanding and had no further questions.

## 2012-04-03 ENCOUNTER — Inpatient Hospital Stay: Admission: RE | Admit: 2012-04-03 | Payer: Medicare Other | Source: Ambulatory Visit

## 2012-04-03 ENCOUNTER — Institutional Professional Consult (permissible substitution): Payer: Medicare Other | Admitting: Pulmonary Disease

## 2012-04-03 ENCOUNTER — Ambulatory Visit (HOSPITAL_COMMUNITY): Payer: Medicare Other | Attending: Internal Medicine

## 2012-04-03 ENCOUNTER — Ambulatory Visit (INDEPENDENT_AMBULATORY_CARE_PROVIDER_SITE_OTHER)
Admission: RE | Admit: 2012-04-03 | Discharge: 2012-04-03 | Disposition: A | Discharge status: Home / Self Care | Payer: Medicare Other | Source: Ambulatory Visit | Attending: Pulmonary Disease | Admitting: Pulmonary Disease

## 2012-04-03 ENCOUNTER — Other Ambulatory Visit (INDEPENDENT_AMBULATORY_CARE_PROVIDER_SITE_OTHER): Payer: Medicare Other

## 2012-04-03 DIAGNOSIS — R0989 Other specified symptoms and signs involving the circulatory and respiratory systems: Secondary | ICD-10-CM

## 2012-04-03 DIAGNOSIS — R0600 Dyspnea, unspecified: Secondary | ICD-10-CM

## 2012-04-03 DIAGNOSIS — I079 Rheumatic tricuspid valve disease, unspecified: Secondary | ICD-10-CM | POA: Insufficient documentation

## 2012-04-03 DIAGNOSIS — R0609 Other forms of dyspnea: Secondary | ICD-10-CM | POA: Insufficient documentation

## 2012-04-03 DIAGNOSIS — I10 Essential (primary) hypertension: Secondary | ICD-10-CM | POA: Insufficient documentation

## 2012-04-03 DIAGNOSIS — E785 Hyperlipidemia, unspecified: Secondary | ICD-10-CM | POA: Insufficient documentation

## 2012-04-03 LAB — BASIC METABOLIC PANEL
CO2: 27 mEq/L (ref 19–32)
Calcium: 9.2 mg/dL (ref 8.4–10.5)
Chloride: 109 mEq/L (ref 96–112)
Creatinine, Ser: 1.1 mg/dL (ref 0.4–1.5)
Glucose, Bld: 90 mg/dL (ref 70–99)
Sodium: 141 mEq/L (ref 135–145)

## 2012-04-03 MED ORDER — IOHEXOL 300 MG/ML  SOLN
80.0000 mL | Freq: Once | INTRAMUSCULAR | Status: AC | PRN
  Administered 2012-04-03: 80 mL via INTRAVENOUS

## 2012-04-03 NOTE — Progress Notes (Signed)
Echocardiogram performed.  

## 2012-04-12 ENCOUNTER — Telehealth: Payer: Self-pay | Admitting: Pulmonary Disease

## 2012-04-12 NOTE — Telephone Encounter (Signed)
Pt was requesting to change appt with Dr Frederico Hamman to another day next week.  Pt informed that she is not in office any other day that week.  Pt states he will keep this appt and contact us if he is unable to keep appt.

## 2012-04-18 ENCOUNTER — Telehealth: Payer: Self-pay | Admitting: Pulmonary Disease

## 2012-04-18 MED ORDER — TIOTROPIUM BROMIDE MONOHYDRATE 18 MCG IN CAPS: 18.0000 ug | ORAL_CAPSULE | Freq: Every day | RESPIRATORY_TRACT | Status: DC

## 2012-04-18 NOTE — Telephone Encounter (Signed)
Patient to be seen back on office with DA 11/7 @1130  Patient last seen 10/10 and started on Spiriva-now requesting Rx be called into pharm.  Spiriva 1 capsule inhaled qd x 3rf sent to [facility] [place], [place] Pt aware.

## 2012-04-19 ENCOUNTER — Telehealth: Payer: Self-pay | Admitting: Pulmonary Disease

## 2012-04-19 MED ORDER — TIOTROPIUM BROMIDE MONOHYDRATE 18 MCG IN CAPS: 18.0000 ug | ORAL_CAPSULE | Freq: Every day | RESPIRATORY_TRACT | Status: DC

## 2012-04-19 NOTE — Telephone Encounter (Signed)
Rx was printed yesterday instead of sent electronically.    I have called spiriva rx into [place], Teacher, early years/pre, at [employer] who verbalized understanding.

## 2012-04-20 ENCOUNTER — Encounter: Payer: Self-pay | Admitting: Pulmonary Disease

## 2012-04-20 ENCOUNTER — Ambulatory Visit (INDEPENDENT_AMBULATORY_CARE_PROVIDER_SITE_OTHER): Payer: Medicare Other | Admitting: Pulmonary Disease

## 2012-04-20 ENCOUNTER — Other Ambulatory Visit (INDEPENDENT_AMBULATORY_CARE_PROVIDER_SITE_OTHER): Payer: Medicare Other

## 2012-04-20 VITALS — BP 132/66 | HR 65 | Temp 97.3°F | Ht 67.0 in | Wt 160.4 lb

## 2012-04-20 DIAGNOSIS — I639 Cerebral infarction, unspecified: Secondary | ICD-10-CM

## 2012-04-20 DIAGNOSIS — I2789 Other specified pulmonary heart diseases: Secondary | ICD-10-CM

## 2012-04-20 DIAGNOSIS — R0600 Dyspnea, unspecified: Secondary | ICD-10-CM

## 2012-04-20 DIAGNOSIS — J8410 Pulmonary fibrosis, unspecified: Secondary | ICD-10-CM

## 2012-04-20 DIAGNOSIS — Z79899 Other long term (current) drug therapy: Secondary | ICD-10-CM

## 2012-04-20 DIAGNOSIS — R0989 Other specified symptoms and signs involving the circulatory and respiratory systems: Secondary | ICD-10-CM

## 2012-04-20 DIAGNOSIS — J849 Interstitial pulmonary disease, unspecified: Secondary | ICD-10-CM

## 2012-04-20 DIAGNOSIS — I6350 Cerebral infarction due to unspecified occlusion or stenosis of unspecified cerebral artery: Secondary | ICD-10-CM

## 2012-04-20 DIAGNOSIS — I272 Pulmonary hypertension, unspecified: Secondary | ICD-10-CM

## 2012-04-20 DIAGNOSIS — Z23 Encounter for immunization: Secondary | ICD-10-CM

## 2012-04-20 DIAGNOSIS — G319 Degenerative disease of nervous system, unspecified: Secondary | ICD-10-CM

## 2012-04-20 DIAGNOSIS — J449 Chronic obstructive pulmonary disease, unspecified: Secondary | ICD-10-CM

## 2012-04-20 LAB — TSH: TSH: 4.04 u[IU]/mL (ref 0.35–5.50)

## 2012-04-20 MED ORDER — PANTOPRAZOLE SODIUM 40 MG PO TBEC: 40.0000 mg | DELAYED_RELEASE_TABLET | Freq: Every day | ORAL | Status: DC

## 2012-04-20 NOTE — Patient Instructions (Addendum)
Please start Protonix 40 mg daily.  We will obtain complete pulmonary function tests, a swallow evaluation and ultrasound of the lower extremities.   We will see you back in 2 weeks.   We will refer you to see Cardiology first available. Would prefer Dr. Jeannene Patella.

## 2012-04-21 ENCOUNTER — Other Ambulatory Visit: Payer: Self-pay | Admitting: *Deleted

## 2012-04-21 ENCOUNTER — Encounter (INDEPENDENT_AMBULATORY_CARE_PROVIDER_SITE_OTHER): Payer: Medicare Other

## 2012-04-21 ENCOUNTER — Other Ambulatory Visit: Payer: Self-pay | Admitting: Cardiology

## 2012-04-21 ENCOUNTER — Encounter: Payer: Self-pay | Admitting: Pulmonary Disease

## 2012-04-21 DIAGNOSIS — R0600 Dyspnea, unspecified: Secondary | ICD-10-CM

## 2012-04-21 DIAGNOSIS — I272 Pulmonary hypertension, unspecified: Secondary | ICD-10-CM

## 2012-04-21 DIAGNOSIS — Z23 Encounter for immunization: Secondary | ICD-10-CM | POA: Insufficient documentation

## 2012-04-21 DIAGNOSIS — J849 Interstitial pulmonary disease, unspecified: Secondary | ICD-10-CM | POA: Insufficient documentation

## 2012-04-21 DIAGNOSIS — R0602 Shortness of breath: Secondary | ICD-10-CM

## 2012-04-21 LAB — RHEUMATOID FACTOR: Rhuematoid fact SerPl-aCnc: <10 IU/mL (ref <=14)

## 2012-04-21 NOTE — Progress Notes (Addendum)
Subjective:    Patient ID: GRETCHEN WATWOOD, male    DOB: 11-Jan-1924, 78 y.o.   MRN: [id number]  78 y.o referred as a new consult for dyspnea and abnormal CXR by Dr. Docia Chuck   HPI  78 yo gentleman with per his report, no significant history of respiratory disease presents to the clinic for dyspnea at rest and with exertion. He denies chest pain, fever, chills, PND or LE edema; admits coughing up white yellow sputum and wheezing from time to time. Denies previous episodes of bronchitis, hospitalizations or ED visits for respiratory distress. Has not been on prednisone for respiratory distress.   He return today to clinic for evaluation of his chronic dyspnea symptoms and to discuss recent test results.    Past Medical History  Diagnosis Date  . Hypertension   . Glaucoma(365)   . Hypercholesteremia    No past surgical history on file.  No family history on file.  Allergies  Allergen Reactions  . Floxin (Ofloxacin) Other (See Comments)    Lost feeling in feet  . Penicillins Other (See Comments)    Ulcers in mouth  . Sulfa Antibiotics Other (See Comments)    Ulcers in mouth   Current Outpatient Prescriptions  Medication Sig Dispense Refill  . albuterol (PROVENTIL HFA;VENTOLIN HFA) 108 (90 BASE) MCG/ACT inhaler Inhale 2 puffs into the lungs every 6 (six) hours as needed for wheezing.  1 Inhaler  6  . aspirin 81 MG tablet Take 81 mg by mouth daily.      Marland Kitchen atorvastatin (LIPITOR) 10 MG tablet Take 10 mg by mouth daily.        Marland Kitchen dutasteride (AVODART) 0.5 MG capsule Take 0.5 mg by mouth daily.      Marland Kitchen losartan (COZAAR) 50 MG tablet Take 50 mg by mouth daily.        Marland Kitchen Spacer/Aero Chamber Mouthpiece MISC 1 applicator by Does not apply route once.  1 each  1  . Tamsulosin HCl (FLOMAX) 0.4 MG CAPS Take 0.4 mg by mouth daily.      Marland Kitchen tiotropium (SPIRIVA) 18 MCG inhalation capsule Place 1 capsule (18 mcg total) into inhaler and inhale daily.  30 capsule  3  . pantoprazole (PROTONIX) 40 MG  tablet Take 1 tablet (40 mg total) by mouth daily.  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  HENT: Negative for hearing loss, ear pain, nosebleeds, congestion, sore throat, facial swelling, rhinorrhea, sneezing, mouth sores, trouble swallowing, neck pain, neck stiffness, dental problem, voice change, postnasal drip, sinus pressure, tinnitus and ear discharge.   Eyes: Negative for visual disturbance.  Respiratory: Positive for cough, shortness of breath and wheezing. Negative for choking and chest tightness.   Cardiovascular: Negative for chest pain and palpitations.  Gastrointestinal: Negative for nausea, vomiting, abdominal pain, constipation, blood in stool and abdominal distention.  Genitourinary: Negative for difficulty urinating.  Musculoskeletal: Negative for myalgias, back pain, joint swelling, arthralgias and gait problem.  Skin: Negative for color change, pallor and rash.  Neurological: Negative for dizziness, tremors, weakness, light-headedness and headaches.  Hematological: Does not bruise/bleed easily.  Psychiatric/Behavioral: Negative for confusion, disturbed wake/sleep cycle and agitation. The patient is not nervous/anxious.        Objective:   Physical Exam BP 132/66  Pulse 65  Temp 97.3 F (36.3 C) (Oral)  Ht 5\' 7"  (1.702 m)  Wt 160 lb 6.4 oz (72.757 kg)  BMI 25.12 kg/m2  SpO2 93% General: Comfortable HEENT ; pupils round and reactive to light; no nasal mucosa edema and erythema; oropharynx with no erythema, edema or exudate; no cervical LAD  Cardiovascular: s1s2, no murmurs. Regular rate and rhythm.  Respiratory: Decreased breath sounds bilaterally with no wheezing, rhonchi or crackles; No increased work of  breathing.  Abdomen: soft NT ND BS+.   Extremities: no pedal edema. Homans negative  Central nervous system: Alert and oriented. He does have balance problems falling to the R with ambulation; walks with a walker; has no overt focal neurologic defficits Skin no rash  Musculoskeletal decreased muscle tone;    2 D echo 05/19/2007  SUMMARY - Overall left ventricular systolic function was normal. Left ventricular ejection fraction was estimated , range being 65 % to 70 %. There were no left ventricular regional wall motion abnormalities. Left ventricular wall thickness was mildly increased. - The aortic valve was mildly calcified. There was trivial aortic valvular regurgitation. - Mean transmitral gradient was 3 mmHg. Mitral valve area by pressure half-time was 5 cm^2. - The estimated peak right ventricular systolic pressure was mild to moderately increased. Estimated peak right ventricular systolic pressure was in the range of 35 mmHg to 45 mmHg.  2 D echo 03/2012 Study Conclusions  - Left ventricle: The cavity size was normal. Wall thickness was normal. Doppler parameters are consistent with abnormal left ventricular relaxation (grade 1 diastolic dysfunction). - Aortic valve: AV is thckened, calcified with mildly restricted motion. Peak and mean gradients through the valve are 31 and 17 mm Hg consistent with mild AS. - Pulmonary arteries: PA peak pressure: 51mm Hg (S).    CT chest 2000 IMPRESSION:  1. THE PROCESS DESCRIBED IN THE RIGHT UPPER LOBE IS CONSISTENT WITH SCARRING. SINCE WE HAVE NO  PRIOR FILMS TO DOCUMENT STABILITY OF THIS PROCESS I WOULD RECOMMEND A FOLLOW-UP PA AND LATERAL  CHEST FILM IN APPROXIMATELY 3 MONTHS.  2. MODERATE EMPHYSEMA IN BOTH UPPER LUNG ZONES.        CXR 03/09/2012 personally reviewed The patient has chronic interstitial and obstructive lung  disease with blebs at the right apex. No acute infiltrates or  effusions. Heart size and vascularity  are normal. No acute  osseous abnormality.  MRI 03/04/2011  Generalized atrophy. Mild to moderate ventricular  enlargement, unchanged from the prior study. Chronic ischemic  changes in the white matter bilaterally. Chronic infarct in the  left thalamus and in the pons bilaterally, with progression from  the prior MRI. Negative for acute infarct. Negative for  hemorrhage or mass lesion.   CT chest 03/2012 IMPRESSION:  1. Moderate to marked centrilobular emphysema.  2. Bibasilar interstitial lung disease. Favor a somewhat atypical  appearance of usual interstitial pneumonitis/pulmonary fibrosis.  Chronic aspiration could look similar.  3. Pulmonary artery enlargement suggests pulmonary arterial  hypertension.  4. 4 mm right lower lobe and possible 4 mm right upper lobe lung  nodules. Given risk factors for bronchogenic carcinoma, follow-up  chest CT at 1 year is recommended. This recommendation follows the  consensus statement: "Guidelines for Management of Small Pulmonary  Nodules Detected on CT Scans: A Statement from the Fleischner  Society" as published in Radiology 2005; 237:395-400. Available  online at: DietDisorder.cz.  5. Moderate hiatal hernia.  6. Mild nonacute superior endplate compression deformity T12 with  minimal canal encroachment.  Spirometry 03/23/12    04/20/2012 FVC 3.03 L 81%           2.81L 76% FEV1 1.77L 64%          1.58L 57% FEV1/FVC 58                56%     Assessment & Plan:    Dyspnea  Due to a combination of COPD,  ILD, aortic stenosis and pulmonary hypertension.   Aortic stenosis  Would recommend follow up with cardiology.   Pulmonary Hypertension Most likely multifactorial secondary to COPD/ILD and left heart disease/ aortic stenosis.There is some evidence of elevated RV pressure on the previous echo 2008;unclear to me if pulmonary pressure now in similar range or elevated compared to previous study;  I will ask cardiology to comment on this aspect.     I I would not initiate systemic treatment fro pulmonary hypertension given his severe parenchymal lung disease emphysema and ILD.   Doubt this is secondary to thromboembolic disease, but I will check LE venous dopplers and consider in the future VQ scaning.  Doubt there is a component of pulmonary tumor emboli.    ILD Non characteristic for UIP or NSIP. The CT changes are very atypical. So far the ANA, RhF are negative. The pattern is more of a micronodular interlobular and peri bronchovascular pattern. The DDx is very broad. The FVC today was significantly lower, there was a 200 cc drop in [facility], which is concerning of a rapidly progressive decline. I will confirm by repeating the PFT and obtaining full PFTs with DLCO in the pulmonary lab.   Given the history of stroke and CT pattern aspiration is of concern. I ordered a swallow evaluation.   4 mm nodules  Needs a 1 year CT follow up.    COPD moderate  See above spirometry; given significant dyspnea will initiate Albuterol and Spiriva. Given history of glaucoma we will closely monitor for manifestations of worsening eye pressure. The patient states that his opthalmologist keeps a close eye on his intraocular pressures and has not had recent problems. I also advised about urinary retention side effects and advised to stop the Spiriva if he develops problems urinating.  Walk for desaturation performed in clinic 03/23/2012; no need for supplemental oxygen with ambulation. However we will obtain an ABG with next visit to assess pO2.    Previous evidence of chronic thalamic strokes and moderate ventricular enlargement. The patient complains of gait instability and vertigo limiting his exercise ability. I would like him to enrol in pulmonary rehabilitation however I am concerned of these previous abnormalities on the MRI. I think it would be beneficial for him to follow up with a neurologist if not  already done so in the past.  I discussed this issue with the patient and his daughter and they would like to postpone seeing a neurologist.   General health: received the flu and pneumococcus vaccine.     Mr. Domine has a very complex array of medical issues. I spent more than 45 min in clinic today discussing the test results and future plan with him and his daughter.   Vanetta Mulders, MD  [facility]  [place], [place]

## 2012-04-23 NOTE — Progress Notes (Signed)
Jim Contreras. Not sure this mild degree of AS would change things much. Though he has plenty of reasons for his PA pressures to fluctuate. I will check him out and let you know. Thanks for sending. -dan

## 2012-04-25 ENCOUNTER — Ambulatory Visit (HOSPITAL_COMMUNITY): Admission: RE | Admit: 2012-04-25 | Payer: Medicare Other | Source: Ambulatory Visit

## 2012-04-25 ENCOUNTER — Inpatient Hospital Stay (HOSPITAL_COMMUNITY): Admission: RE | Admit: 2012-04-25 | Payer: Medicare Other | Source: Ambulatory Visit

## 2012-04-25 ENCOUNTER — Other Ambulatory Visit: Payer: Medicare Other

## 2012-04-25 DIAGNOSIS — J849 Interstitial pulmonary disease, unspecified: Secondary | ICD-10-CM

## 2012-04-28 ENCOUNTER — Ambulatory Visit (HOSPITAL_COMMUNITY)
Admission: RE | Admit: 2012-04-28 | Discharge: 2012-04-28 | Disposition: A | Discharge status: Home / Self Care | Payer: Medicare Other | Source: Ambulatory Visit | Attending: Pulmonary Disease | Admitting: Pulmonary Disease

## 2012-04-28 ENCOUNTER — Telehealth: Payer: Self-pay | Admitting: Pulmonary Disease

## 2012-04-28 DIAGNOSIS — R1310 Dysphagia, unspecified: Secondary | ICD-10-CM

## 2012-04-28 DIAGNOSIS — J8410 Pulmonary fibrosis, unspecified: Secondary | ICD-10-CM | POA: Insufficient documentation

## 2012-04-28 DIAGNOSIS — J4489 Other specified chronic obstructive pulmonary disease: Secondary | ICD-10-CM | POA: Insufficient documentation

## 2012-04-28 DIAGNOSIS — J849 Interstitial pulmonary disease, unspecified: Secondary | ICD-10-CM

## 2012-04-28 DIAGNOSIS — E7800 Pure hypercholesterolemia, unspecified: Secondary | ICD-10-CM | POA: Insufficient documentation

## 2012-04-28 DIAGNOSIS — R1313 Dysphagia, pharyngeal phase: Secondary | ICD-10-CM | POA: Insufficient documentation

## 2012-04-28 DIAGNOSIS — R918 Other nonspecific abnormal finding of lung field: Secondary | ICD-10-CM | POA: Insufficient documentation

## 2012-04-28 DIAGNOSIS — H409 Unspecified glaucoma: Secondary | ICD-10-CM | POA: Insufficient documentation

## 2012-04-28 DIAGNOSIS — I10 Essential (primary) hypertension: Secondary | ICD-10-CM | POA: Insufficient documentation

## 2012-04-28 DIAGNOSIS — J449 Chronic obstructive pulmonary disease, unspecified: Secondary | ICD-10-CM | POA: Insufficient documentation

## 2012-04-28 LAB — RESPIRATORY CULTURE OR RESPIRATORY AND SPUTUM CULTURE

## 2012-04-28 NOTE — Telephone Encounter (Signed)
Pt is scheduled for PFT 05/04/12 at 4 PM and appt w/ Dr. Frederico Hamman is scheduled for 05/05/12.

## 2012-04-28 NOTE — Telephone Encounter (Signed)
I called the patient with the results; so far the only positive finding on the most recent tests, is possible fluid aspiration on swallow that clears with small sips. No change in diet necessary at this time.   The patient states that he has two appointments scheduled in the pulmonary clinic. 11/21 and 11/22. One of these appointments might be with cardiology. If indeed both appointments are scheduled with us,we will try to reschedule such that he comes to clinic only once next week.

## 2012-04-28 NOTE — Procedures (Signed)
Objective Swallowing Evaluation: Modified Barium Swallowing Study  Patient Details  Name: ESHAAN KITZMANN MRN: [id number] Date of Birth: 03-19-1924  Today's Date: 04/28/2012 Time: 1206-1228 SLP Time Calculation (min): 22 min  Past Medical History:  Past Medical History  Diagnosis Date  . Hypertension   . Glaucoma(365)   . Hypercholesteremia    Past Surgical History: No past surgical history on file. HPI:  78 year old male with history of COPD, HTN, glaucoma, hypercholesteremia, seen for OP MBS to r/o aspiration. Per most recent MD visit, CT of chest indicates RLL and RUL lung nodules as well as bibasilar interstitial lung disease worrisome for chronic aspiration.      Assessment / Plan / Recommendation Clinical Impression  Dysphagia Diagnosis: Mild pharyngeal phase dysphagia Clinical impression: Patient presents with a mild pharyngeal based dysphagia characterized by mildly decreased base of tongue and laryngeal strength when combined with what appears to be mild anterior curvature of cervical spine, results in decreased laryngeal closure and deep penetration with large bolus sizes of thin liquid. Small, controlled sips of thin liquid via cup are consumed without penetration or aspiration and additionally, allow patient extra time in between sips to dry swallow which is effective at clearing mild pharyngeal residuals. Patient judged safe to continue on a regular diet however would advise use of compensatory strategies and aspiration precautions to decrease risk at this time.     Treatment Recommendation  No treatment recommended at this time    Diet Recommendation Regular;Thin liquid   Liquid Administration via: Cup;No straw Medication Administration: Whole meds with liquid Supervision: Patient able to self feed Compensations: Slow rate;Small sips/bites (single cup sips of liquid, no chugging) Postural Changes and/or Swallow Maneuvers: Seated upright 90 degrees;Upright 30-60 min  after meal    Other  Recommendations Oral Care Recommendations: Oral care BID   Follow Up Recommendations  None               General HPI: 78 year old male with history of COPD, HTN, glaucoma, hypercholesteremia, seen for OP MBS to r/o aspiration. Per most recent MD visit, CT of chest indicates RLL and RUL lung nodules as well as bibasilar interstitial lung disease worrisome for chronic aspiration.  Type of Study: Modified Barium Swallowing Study Reason for Referral: Objectively evaluate swallowing function Previous Swallow Assessment: none per patient Diet Prior to this Study: Regular;Thin liquids Temperature Spikes Noted: No Respiratory Status: Room air History of Recent Intubation: No Behavior/Cognition: Alert;Cooperative;Pleasant mood Oral Cavity - Dentition: Adequate natural dentition Oral Motor / Sensory Function: Within functional limits Self-Feeding Abilities: Able to feed self Patient Positioning: Upright in chair Baseline Vocal Quality: Clear Volitional Cough: Strong Volitional Swallow: Able to elicit Anatomy: Other (Comment) (mild anterior appearing curvature of cervical spine) Pharyngeal Secretions: Not observed secondary MBS    Reason for Referral Objectively evaluate swallowing function   Oral Phase Oral Preparation/Oral Phase Oral Phase: Impaired Oral - Thin Oral - Thin Cup: Lingual pumping (mild) Oral - Thin Straw: Lingual pumping (mild) Oral - Solids Oral - Puree: Within functional limits Oral - Mechanical Soft: Within functional limits Oral - Pill: Within functional limits   Pharyngeal Phase Pharyngeal Phase Pharyngeal Phase: Impaired Pharyngeal - Thin Pharyngeal - Thin Cup: Reduced laryngeal elevation;Reduced anterior laryngeal mobility;Pharyngeal residue - valleculae;Pharyngeal residue - pyriform sinuses;Reduced tongue base retraction Pharyngeal - Thin Straw: Reduced anterior laryngeal mobility;Reduced laryngeal elevation;Reduced tongue base  retraction;Pharyngeal residue - valleculae;Pharyngeal residue - pyriform sinuses;Penetration/Aspiration during swallow Penetration/Aspiration details (thin straw): Material enters airway, CONTACTS  cords and not ejected out (cleared with cued throat clear) Pharyngeal - Solids Pharyngeal - Puree: Reduced anterior laryngeal mobility;Reduced laryngeal elevation;Reduced tongue base retraction;Pharyngeal residue - valleculae Pharyngeal - Mechanical Soft: Reduced anterior laryngeal mobility;Reduced laryngeal elevation;Reduced tongue base retraction Pharyngeal - Pill: Within functional limits (deep penetration of thin liquids noted while consuming pill)  Cervical Esophageal Phase    GO    Cervical Esophageal Phase Cervical Esophageal Phase: [facility]    Functional Assessment Tool Used: skilled clinical judgement Functional Limitations: Swallowing Swallow Current Status ([id number]): At least 20 percent but less than 40 percent impaired, limited or restricted Swallow Goal Status [phone]): At least 20 percent but less than 40 percent impaired, limited or restricted Swallow Discharge Status [phone]): At least 20 percent but less than 40 percent impaired, limited or restricted   [facility] MA, CCC-SLP [phone]  Ferdinand Lango Meryl 04/28/2012, 3:05 PM

## 2012-05-01 NOTE — Telephone Encounter (Signed)
No available pft openings

## 2012-05-04 ENCOUNTER — Ambulatory Visit (INDEPENDENT_AMBULATORY_CARE_PROVIDER_SITE_OTHER): Payer: Medicare Other | Admitting: Pulmonary Disease

## 2012-05-04 DIAGNOSIS — J849 Interstitial pulmonary disease, unspecified: Secondary | ICD-10-CM

## 2012-05-04 DIAGNOSIS — J8410 Pulmonary fibrosis, unspecified: Secondary | ICD-10-CM

## 2012-05-04 NOTE — Progress Notes (Signed)
PFT done today. 

## 2012-05-05 ENCOUNTER — Encounter: Payer: Self-pay | Admitting: Pulmonary Disease

## 2012-05-05 ENCOUNTER — Ambulatory Visit (INDEPENDENT_AMBULATORY_CARE_PROVIDER_SITE_OTHER): Payer: Medicare Other | Admitting: Pulmonary Disease

## 2012-05-05 VITALS — BP 140/70 | HR 68 | Temp 97.8°F | Ht 67.0 in | Wt 161.0 lb

## 2012-05-05 DIAGNOSIS — J8410 Pulmonary fibrosis, unspecified: Secondary | ICD-10-CM

## 2012-05-05 DIAGNOSIS — I272 Pulmonary hypertension, unspecified: Secondary | ICD-10-CM

## 2012-05-05 DIAGNOSIS — J449 Chronic obstructive pulmonary disease, unspecified: Secondary | ICD-10-CM

## 2012-05-05 DIAGNOSIS — J849 Interstitial pulmonary disease, unspecified: Secondary | ICD-10-CM

## 2012-05-05 DIAGNOSIS — R0609 Other forms of dyspnea: Secondary | ICD-10-CM

## 2012-05-05 DIAGNOSIS — I2789 Other specified pulmonary heart diseases: Secondary | ICD-10-CM

## 2012-05-05 DIAGNOSIS — R0600 Dyspnea, unspecified: Secondary | ICD-10-CM

## 2012-05-05 NOTE — Progress Notes (Signed)
Subjective:    Patient ID: Jim Contreras, male    DOB: Nov 13, 1923, 78 y.o.   MRN: [id number]  78 yo referred as a new consult for dyspnea and abnormal CXR by Dr. Ricci Barker. Was found to have mild AS, pulmonary hypertension, COPD with diffuse emphysema, ILD with a lower lobe distribution atypical for UIP.   HPI He return today to clinic for evaluation of his chronic dyspnea symptoms and to discuss recent test results.   78 yo gentleman with per his report, no significant history of respiratory disease presents to the clinic for dyspnea at rest and with exertion. He denies chest pain, fever, chills, PND or LE edema; admits coughing up white yellow sputum and wheezing from time to time. Denies previous episodes of bronchitis, hospitalizations or ED visits for respiratory distress. Has not been on prednisone for respiratory distress.   Today he states that he feels well; but does report increased in dyspnea on exertion, decreased exercise capacity and problems with his gate. He thinks he is unsteady. Walks with a walker.      Past Medical History  Diagnosis Date  . Hypertension   . Glaucoma(365)   . Hypercholesteremia    No past surgical history on file.  No family history on file.  Allergies  Allergen Reactions  . Floxin (Ofloxacin) Other (See Comments)    Lost feeling in feet  . Penicillins Other (See Comments)    Ulcers in mouth  . Sulfa Antibiotics Other (See Comments)    Ulcers in mouth   Current Outpatient Prescriptions  Medication Sig Dispense Refill  . albuterol (PROVENTIL HFA;VENTOLIN HFA) 108 (90 BASE) MCG/ACT inhaler Inhale 2 puffs into the lungs every 6 (six) hours as needed for wheezing.  1 Inhaler  6  . amLODipine (NORVASC) 5 MG tablet Once a day      . aspirin 81 MG tablet Take 81 mg by mouth daily.      Marland Kitchen atorvastatin (LIPITOR) 10 MG tablet Take 10 mg by mouth daily.        Marland Kitchen dutasteride (AVODART) 0.5 MG capsule Take 0.5 mg by mouth daily.      Marland Kitchen losartan (COZAAR)  50 MG tablet Take 50 mg by mouth daily.        . pantoprazole (PROTONIX) 40 MG tablet Take 1 tablet (40 mg total) by mouth daily.  30 tablet  11  . Spacer/Aero Chamber Mouthpiece MISC 1 applicator by Does not apply route once.  1 each  1  . Tamsulosin HCl (FLOMAX) 0.4 MG CAPS Take 0.4 mg by mouth daily.      Marland Kitchen tiotropium (SPIRIVA) 18 MCG inhalation capsule Place 1 capsule (18 mcg total) into inhaler and inhale daily.  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  HENT: Negative for hearing loss, ear pain, nosebleeds, congestion, sore throat, facial swelling, rhinorrhea, sneezing, mouth sores, trouble swallowing, neck pain, neck stiffness, dental problem, voice change, postnasal drip, sinus pressure, tinnitus and ear discharge.   Eyes: Negative for visual disturbance.  Respiratory: Positive for cough, shortness of breath and wheezing. Negative for choking and chest tightness.   Cardiovascular: Negative for chest pain and palpitations.  Gastrointestinal: Negative for nausea, vomiting, abdominal pain, constipation, blood in stool and abdominal distention.  Genitourinary: Negative for difficulty urinating.  Musculoskeletal: Negative for myalgias, back pain, joint swelling, arthralgias and gait problem.  Skin: Negative for color change, pallor and rash.  Neurological: Negative for dizziness, tremors, weakness, light-headedness and headaches.  Hematological: Does not bruise/bleed easily.  Psychiatric/Behavioral: Negative for confusion, disturbed wake/sleep cycle and agitation. The patient is not nervous/anxious.        Objective:   Physical Exam BP 140/70  Pulse 68  Temp 97.8 F (36.6 C) (Oral)  Ht 5\' 7"  (1.702 m)  Wt 161 lb (73.029 kg)   BMI 25.22 kg/m2  SpO2 90% General: Comfortable, frail looking  HEENT ; pupils round and reactive to light; no nasal mucosa edema and erythema; oropharynx with no erythema, edema or exudate; no cervical LAD  Cardiovascular: s1s2, no murmurs. Regular rate and rhythm.  Respiratory: Decreased breath sounds bilaterally with no wheezing, rhonchi or crackles; No increased work of breathing.  Abdomen: soft NT ND BS+.   Extremities: no pedal edema. Homans negative  Central nervous system: Alert and oriented. He does have balance problems falling to the R with ambulation; walks with a walker; has no overt focal neurologic defficits Skin no rash  Musculoskeletal decreased muscle tone;    2 D echo 05/19/2007  SUMMARY - Overall left ventricular systolic function was normal. Left ventricular ejection fraction was estimated , range being 65 % to 70 %. There were no left ventricular regional wall motion abnormalities. Left ventricular wall thickness was mildly increased. - The aortic valve was mildly calcified. There was trivial aortic valvular regurgitation. - Mean transmitral gradient was 3 mmHg. Mitral valve area by pressure half-time was 5 cm^2. - The estimated peak right ventricular systolic pressure was mild to moderately increased. Estimated peak right ventricular systolic pressure was in the range of 35 mmHg to 45 mmHg.  2 D echo 03/2012 Study Conclusions  - Left ventricle: The cavity size was normal. Wall thickness was normal. Doppler parameters are consistent with abnormal left ventricular relaxation (grade 1 diastolic dysfunction). - Aortic valve: AV is thckened, calcified with mildly restricted motion. Peak and mean gradients through the valve are 31 and 17 mm Hg consistent with mild AS. - Pulmonary arteries: PA peak pressure: 51mm Hg (S).    CT chest 2000 IMPRESSION:  1. THE PROCESS DESCRIBED IN THE RIGHT UPPER LOBE IS CONSISTENT WITH SCARRING. SINCE WE HAVE NO  PRIOR FILMS  TO DOCUMENT STABILITY OF THIS PROCESS I WOULD RECOMMEND A FOLLOW-UP PA AND LATERAL  CHEST FILM IN APPROXIMATELY 3 MONTHS.  2. MODERATE EMPHYSEMA IN BOTH UPPER LUNG ZONES.        CXR 03/09/2012 personally reviewed The patient has chronic interstitial and obstructive lung  disease with blebs at the right apex. No acute infiltrates or  effusions. Heart size and vascularity are normal. No acute  osseous abnormality.  MRI 03/04/2011  Generalized atrophy. Mild to moderate ventricular  enlargement, unchanged from the prior study. Chronic ischemic  changes in the white matter bilaterally. Chronic infarct in the  left thalamus and in the pons bilaterally, with progression from  the prior MRI. Negative for acute infarct. Negative for  hemorrhage or mass lesion.   CT chest 03/2012 IMPRESSION:  1. Moderate to marked centrilobular emphysema.  2. Bibasilar interstitial lung disease. Favor a somewhat atypical  appearance of usual interstitial pneumonitis/pulmonary fibrosis.  Chronic aspiration could look similar.  3. Pulmonary artery enlargement suggests pulmonary arterial  hypertension.  4. 4 mm right lower lobe and possible 4 mm right upper lobe lung  nodules. Given risk  factors for bronchogenic carcinoma, follow-up  chest CT at 1 year is recommended. This recommendation follows the  consensus statement: "Guidelines for Management of Small Pulmonary  Nodules Detected on CT Scans: A Statement from the Fleischner  Society" as published in Radiology 2005; 237:395-400. Available  online at: DietDisorder.cz.  5. Moderate hiatal hernia.  6. Mild nonacute superior endplate compression deformity T12 with  minimal canal encroachment.    Spirometry 03/23/12    04/20/2012             05/04/12 FVC 3.03 L 81%           2.81L 76%             2.87 81% (pre) 3.13 89% (post) FEV1 1.77L 64%          1.58L 57%             1.69 79%(pre) 1.78 83% (post) FEV1/FVC 58                 56%                        57                                                           TLC  3.9 70%                                                           RV  35%                                                           ERV 87%                                                           RV/TLC 24                                                           DLCO 64    Assessment & Plan:    Dyspnea  Due to a combination of COPD,  ILD, aortic stenosis and pulmonary hypertension. Walk for desaturation showed need for supplemental oxygen. At rest the patient's oxygen sat ranged from 88-90, with ambulation he required 3L of oxygen via nasal canula to maintain O2Sat >88%. I also advised him to enrol in pulmonary rehabilitation, pulmonary hypertension protocol.   Aortic stenosis  Would recommend follow up with cardiology. He will see Dr. Elray Mcgregor in clinic.   Pulmonary Hypertension Most likely multifactorial secondary to COPD/ILD and left heart  disease/ aortic stenosis.There is some evidence of elevated RV pressure on the previous echo 2008;unclear to me if pulmonary pressure now in similar range or elevated compared to previous study; I will ask cardiology to comment on this aspect.     I I would not initiate systemic treatment fro pulmonary hypertension given his severe parenchymal lung disease emphysema and ILD.   LE venous dopplers negative; autoimmune ANA, RhF negative, TSH normal.  Doubt there is a component of pulmonary tumor emboli.  6 mwt to be performed in pulmonary lab.    ILD Non characteristic for UIP or NSIP. The CT changes are very atypical. So far the ANA, RhF are negative. The pattern is more of a micronodular interlobular and peri bronchovascular pattern. The DDx is very broad. The FVC today was in similar range.  Given the history of stroke and CT pattern aspiration is of concern. I ordered a swallow evaluation. This showed evidence of aspiration which cleared with  small single bites/sips. I discussed this finding with the patient and his daughter and we agreed for now to continue regular diet. They understand that there is a risk of aspiration complications, given the patient's age and multiple medical problems we agreed that in the overall picture continuing regular diet with the associated risk is beneficial for patient's quality of life and we are not instituting diet restrictions.    4 mm nodules  Needs a 1 year CT follow up.    COPD moderate  See above spirometry; continue Albuterol and Spiriva. Initiate supplemental oxygen and pulmonary rehabilitation.  Given history of glaucoma we will closely monitor for manifestations of worsening eye pressure. The patient states that his opthalmologist keeps a close eye on his intraocular pressures and has not had recent problems. I also advised about urinary retention side effects and advised to stop the Spiriva if he develops problems urinating.   However we will obtain an ABG with next visit to assess pO2.    Previous evidence of chronic thalamic strokes and moderate ventricular enlargement. The patient complains of gait instability and vertigo limiting his exercise ability. I would like him to enrol in pulmonary rehabilitation however I am concerned of these previous abnormalities on the MRI. I think it would be beneficial for him to follow up with a neurologist if not already done so in the past.  I discussed this issue with the patient and his daughter and they would like to postpone seeing a neurologist.   General health: received the flu and pneumococcus vaccine.    Disposition We will see the patient back in 2 months. In the interim I advised him to call with any questions or changes in the health status.    Mr. Froning has a very complex array of medical issues. His has an increased risk for complications, high morbidity and mortality; poor prognosis; I advised him and his daughter to initiate discussions  related to code status and end of life wishes. In case of acute respiratory compromise, I would advise against intubation and mechanical ventilation.    Vanetta Mulders, MD  [facility]  [place], [place]

## 2012-05-05 NOTE — Patient Instructions (Addendum)
We will arrange for Pulmonary Rehabilitation and overnight pulse oxymetry.   Please return to clinic in 2 months. At that time we will have complete PFTs and a repeat CT scan.

## 2012-05-08 ENCOUNTER — Ambulatory Visit (HOSPITAL_COMMUNITY)
Admission: RE | Admit: 2012-05-08 | Discharge: 2012-05-08 | Disposition: A | Discharge status: Home / Self Care | Payer: Medicare Other | Source: Ambulatory Visit | Attending: Internal Medicine | Admitting: Internal Medicine

## 2012-05-08 ENCOUNTER — Encounter (HOSPITAL_COMMUNITY): Payer: Self-pay

## 2012-05-08 VITALS — BP 140/62 | HR 80 | Ht 67.0 in | Wt 160.8 lb

## 2012-05-08 DIAGNOSIS — I359 Nonrheumatic aortic valve disorder, unspecified: Secondary | ICD-10-CM | POA: Insufficient documentation

## 2012-05-08 DIAGNOSIS — I350 Nonrheumatic aortic (valve) stenosis: Secondary | ICD-10-CM | POA: Insufficient documentation

## 2012-05-08 DIAGNOSIS — I272 Pulmonary hypertension, unspecified: Secondary | ICD-10-CM

## 2012-05-08 DIAGNOSIS — I2789 Other specified pulmonary heart diseases: Secondary | ICD-10-CM

## 2012-05-08 DIAGNOSIS — J438 Other emphysema: Secondary | ICD-10-CM | POA: Insufficient documentation

## 2012-05-08 HISTORY — DX: Cerebral infarction, unspecified: I63.9

## 2012-05-08 HISTORY — DX: Interstitial pulmonary disease, unspecified: J84.9

## 2012-05-08 HISTORY — DX: Chronic obstructive pulmonary disease, unspecified: J44.9

## 2012-05-08 HISTORY — DX: Benign prostatic hyperplasia without lower urinary tract symptoms: N40.0

## 2012-05-08 NOTE — Patient Instructions (Addendum)
We will contact you in 1 year with an echocardiogram to schedule your next appointment.

## 2012-05-08 NOTE — Progress Notes (Signed)
Referring Physician: Dr. Frederico Hamman Primary Care: Dr.  Valera Castle Cardiologist:  Weight Range: Baseline proBNP:  HPI: Jim Contreras is a 78 y.o. gentlemen with no known prior cardiac history but recent echo 04/03/12 demonstrated mild AS with peak gradient 31 and mean gradient 17 mmHg.  PAPP 51 mmHg (although upon review appears closer 40-45).  Grade 1 diastolic dysfunction.  He also has COPD, interstitial lung disease followed by Dr. Frederico Hamman.  He was placed on supplemental O2 as he desats with ambulation to <88%.  He also has history of stroke and problems with vertigo.    LE dopplers negative, autoimmune ANA and RhF negative.    PFTs: 04/20/12 FVC 2.81, 76% pred FEV1 1.58, 57% pred FEV1/FVC 56% FEF 25-75%  0.78, 33% pred  CT chest 03/2012: Mod to marked centrilobular emphysema.  Bibasilar interstitial lung disease.  Pulmonary artery enlargement suggest PAH.  4 mm right lower lobe with possible 4 mm right upper lobe lung nodules.  He has been referred by Dr. Frederico Hamman for review of AS and pulm HTN.  He is here with his daughter, Jim Contreras.  He feels great today.  He denies increased dyspnea, orthopnea or PND.  He also denies lower extremity edema but daughter states she has seen ankle edema.  Last visit with Dr. Frederico Hamman he desats with ambulation and was given supplemental O2 although he has not started using it yet.  He has a sleep study scheduled.  He walks with a cane.  He lives alone at home.  He has an aid that comes 3 hours a day 5 days per week to help with cooking and cleaning.      Review of Systems: [y] = yes, [ ]  = no   General: Weight gain [ ] ; Weight loss [ ] ; Anorexia [ ] ; Fatigue [ ] ; Fever [ ] ; Chills [ ] ; Weakness [ ]   Cardiac: Chest pain/pressure [ ] ; Resting SOB [ ] ; Exertional SOB [ ] ; Orthopnea [ ] ; Pedal Edema [ ] ; Palpitations [ ] ; Syncope [ ] ; Presyncope [ ] ; Paroxysmal nocturnal dyspnea[ ]   Pulmonary: Cough [ ] ; Wheezing[ ] ; Hemoptysis[ ] ; Sputum [ ] ; Snoring [ ]   GI: Vomiting[ ] ;  Dysphagia[ ] ; Melena[ ] ; Hematochezia [ ] ; Heartburn[ ] ; Abdominal pain [ ] ; Constipation [ ] ; Diarrhea [ ] ; BRBPR [ ]   GU: Hematuria[ ] ; Dysuria [ ] ; Nocturia[ ]   Vascular: Pain in legs with walking [ ] ; Pain in feet with lying flat [ ] ; Non-healing sores [ ] ; Stroke [ ] ; TIA [ ] ; Slurred speech [ ] ;  Neuro: Headaches[ ] ; Vertigo[ ] ; Seizures[ ] ; Paresthesias[ ] ;Blurred vision [ ] ; Diplopia [ ] ; Vision changes [ ]   Ortho/Skin: Arthritis [ ] ; Joint pain [ ] ; Muscle pain [ ] ; Joint swelling [ ] ; Back Pain [ ] ; Rash [ ]   Psych: Depression[ ] ; Anxiety[ ]   Heme: Bleeding problems [ ] ; Clotting disorders [ ] ; Anemia [ ]   Endocrine: Diabetes [ ] ; Thyroid dysfunction[ ]    Past Medical History  Diagnosis Date  . Hypertension   . Hypercholesteremia   . BPH (benign prostatic hyperplasia)   . ILD (interstitial lung disease)   . COPD, moderate   . Stroke     Thalamic     Current Outpatient Prescriptions  Medication Sig Dispense Refill  . albuterol (PROVENTIL HFA;VENTOLIN HFA) 108 (90 BASE) MCG/ACT inhaler Inhale 2 puffs into the lungs every 6 (six) hours as needed for wheezing.  1 Inhaler  6  . amLODipine (NORVASC) 5 MG tablet Once  a day      . aspirin 81 MG tablet Take 81 mg by mouth daily.      Marland Kitchen dutasteride (AVODART) 0.5 MG capsule Take 0.5 mg by mouth daily.      Marland Kitchen losartan (COZAAR) 50 MG tablet Take 50 mg by mouth daily.        . pantoprazole (PROTONIX) 40 MG tablet Take 1 tablet (40 mg total) by mouth daily.  30 tablet  11  . Spacer/Aero Chamber Mouthpiece MISC 1 applicator by Does not apply route once.  1 each  1  . Tamsulosin HCl (FLOMAX) 0.4 MG CAPS Take 0.4 mg by mouth daily.      Marland Kitchen tiotropium (SPIRIVA) 18 MCG inhalation capsule Place 1 capsule (18 mcg total) into inhaler and inhale daily.  30 capsule  3  . atorvastatin (LIPITOR) 10 MG tablet Take 10 mg by mouth daily.          Allergies  Allergen Reactions  . Floxin (Ofloxacin) Other (See Comments)    Lost feeling in feet  .  Penicillins Other (See Comments)    Ulcers in mouth  . Sulfa Antibiotics Other (See Comments)    Ulcers in mouth    History   Social History  . Marital Status: Widowed    Spouse Name: N/A    Number of Children: 2  . Years of Education: N/A   Occupational History  . retired     Social History Main Topics  . Smoking status: Former Smoker -- 1.0 packs/day for 40 years    Types: Cigarettes    Quit date: 06/15/1971  . Smokeless tobacco: Never Used  . Alcohol Use: Yes     Comment: sparingly  . Drug Use: No  . Sexually Active: Not on file   Other Topics Concern  . Not on file   Social History Narrative  . No narrative on file  Tobacco abuse until 40s.  Lives alone   No family history on file. Negative for CAD, cancer or stroke.  PHYSICAL EXAM: Filed Vitals:   05/08/12 1358  BP: 140/62  Pulse: 80  Height: 5\' 7"  (1.702 m)  Weight: 160 lb 12.8 oz (72.938 kg)  SpO2: 93%    General:  Elderly, Well appearing. No respiratory difficulty HEENT: normal Neck: supple. JVP 6-7. Carotids 2+ bilat; no bruits. No lymphadenopathy or thryomegaly appreciated. Cor: PMI nondisplaced. Regular rate & rhythm. Crisp S2. No rubs, gallops or murmurs. Lungs: clear Abdomen: soft, nontender, nondistended. No hepatosplenomegaly. No bruits or masses. Good bowel sounds. Extremities: no cyanosis, clubbing, rash, edema Neuro: alert & oriented x 3, cranial nerves grossly intact. moves all 4 extremities w/o difficulty. Affect pleasant.    ASSESSMENT & PLAN:

## 2012-05-08 NOTE — Assessment & Plan Note (Addendum)
Mild by echo.  Will continue to follow with serial echos every year.    Patient seen and examined with Ulyess Blossom, PA-C. We discussed all aspects of the encounter. I agree with the assessment and plan as stated above.  Echo reviewed personally. AS is mild. Will repeat echo in 1 year to assess for progression. Doubt this will be an issue for him.

## 2012-05-08 NOTE — Assessment & Plan Note (Addendum)
WHO Class II/III.  Pulmonary pressures on echo have been reviewed and appear to be 40-45 mmHg.  Would not pursue further work up at this time.  Currently no indication for pulmonary vasodilators.  Have instructed him to wear his O2 and the importance behind keeping O2 sats above 90%.  Encouraged him to go to pulmonary rehab.  Patient seen and examined with Ulyess Blossom, PA-C. We discussed all aspects of the encounter. I agree with the assessment and plan as stated above.Echo reviewed personally. PH is mild range by echo. No evidence of RV strain. Not candidate for selective pulmonary vasodilators. Encouraged him to go to Pulmonary rehab.

## 2012-05-22 LAB — FUNGUS CULTURE W SMEAR

## 2012-06-09 LAB — AFB CULTURE WITH SMEAR (NOT AT ARMC): Acid Fast Smear: NONE SEEN

## 2012-07-07 ENCOUNTER — Other Ambulatory Visit: Payer: Self-pay | Admitting: Pulmonary Disease

## 2012-07-07 DIAGNOSIS — J849 Interstitial pulmonary disease, unspecified: Secondary | ICD-10-CM

## 2012-07-10 ENCOUNTER — Other Ambulatory Visit (INDEPENDENT_AMBULATORY_CARE_PROVIDER_SITE_OTHER): Payer: Medicare Other

## 2012-07-10 DIAGNOSIS — J8410 Pulmonary fibrosis, unspecified: Secondary | ICD-10-CM

## 2012-07-10 DIAGNOSIS — J849 Interstitial pulmonary disease, unspecified: Secondary | ICD-10-CM

## 2012-07-10 LAB — BASIC METABOLIC PANEL
Chloride: 106 mEq/L (ref 96–112)
Creatinine, Ser: 1 mg/dL (ref 0.4–1.5)
GFR: 71.91 mL/min (ref >60.00)
Potassium: 4 mEq/L (ref 3.5–5.1)

## 2012-07-11 ENCOUNTER — Ambulatory Visit (INDEPENDENT_AMBULATORY_CARE_PROVIDER_SITE_OTHER)
Admission: RE | Admit: 2012-07-11 | Discharge: 2012-07-11 | Disposition: A | Discharge status: Home / Self Care | Payer: Medicare Other | Source: Ambulatory Visit | Attending: Pulmonary Disease | Admitting: Pulmonary Disease

## 2012-07-11 DIAGNOSIS — J449 Chronic obstructive pulmonary disease, unspecified: Secondary | ICD-10-CM

## 2012-07-11 DIAGNOSIS — J8410 Pulmonary fibrosis, unspecified: Secondary | ICD-10-CM

## 2012-07-11 DIAGNOSIS — J4489 Other specified chronic obstructive pulmonary disease: Secondary | ICD-10-CM

## 2012-07-11 DIAGNOSIS — J849 Interstitial pulmonary disease, unspecified: Secondary | ICD-10-CM

## 2012-07-11 MED ORDER — IOHEXOL 300 MG/ML  SOLN
80.0000 mL | Freq: Once | INTRAMUSCULAR | Status: AC | PRN
  Administered 2012-07-11: 80 mL via INTRAVENOUS

## 2012-07-12 ENCOUNTER — Ambulatory Visit (INDEPENDENT_AMBULATORY_CARE_PROVIDER_SITE_OTHER): Payer: Medicare Other | Admitting: Pulmonary Disease

## 2012-07-12 ENCOUNTER — Encounter: Payer: Self-pay | Admitting: Pulmonary Disease

## 2012-07-12 VITALS — BP 130/68 | HR 68 | Temp 97.7°F | Ht 67.0 in | Wt 160.0 lb

## 2012-07-12 DIAGNOSIS — R0600 Dyspnea, unspecified: Secondary | ICD-10-CM

## 2012-07-12 DIAGNOSIS — J4489 Other specified chronic obstructive pulmonary disease: Secondary | ICD-10-CM

## 2012-07-12 DIAGNOSIS — J849 Interstitial pulmonary disease, unspecified: Secondary | ICD-10-CM

## 2012-07-12 DIAGNOSIS — I272 Pulmonary hypertension, unspecified: Secondary | ICD-10-CM

## 2012-07-12 DIAGNOSIS — J8410 Pulmonary fibrosis, unspecified: Secondary | ICD-10-CM

## 2012-07-12 DIAGNOSIS — J449 Chronic obstructive pulmonary disease, unspecified: Secondary | ICD-10-CM

## 2012-07-12 DIAGNOSIS — R0989 Other specified symptoms and signs involving the circulatory and respiratory systems: Secondary | ICD-10-CM

## 2012-07-12 DIAGNOSIS — I350 Nonrheumatic aortic (valve) stenosis: Secondary | ICD-10-CM

## 2012-07-12 DIAGNOSIS — I359 Nonrheumatic aortic valve disorder, unspecified: Secondary | ICD-10-CM

## 2012-07-12 DIAGNOSIS — I2789 Other specified pulmonary heart diseases: Secondary | ICD-10-CM

## 2012-07-12 LAB — PULMONARY FUNCTION TEST

## 2012-07-12 NOTE — Progress Notes (Signed)
Subjective:    Patient ID: Jim Contreras, male    DOB: 1924-02-09, 79 y.o.   MRN: [id number]  79 yo with complicated medical history including COPD with diffuse emphysema, ILD/NSIP with a lower lobe distribution atypical for UIP, mild AS and pulmonary hypertension.   Jim Contreras has no previous episodes of bronchitis, hospitalizations or ED visits for respiratory distress. Has not been on prednisone for COPD exacerbation or for nay other reason.   He return today to clinic for follow up on dyspnea.  HPI Today he states that overall he feels well; but does report stable dyspnea on exertion, decreased exercise capacity and problems with his gate. He thinks he is unsteady. Walks with a walker/cane.   He denies chest pain, fever, chills, PND or LE edema; admits coughing up white yellow sputum and wheezing from time to time. Denies hemoptysis or weight loss.       Past Medical History  Diagnosis Date  . Hypertension   . Hypercholesteremia   . BPH (benign prostatic hyperplasia)   . ILD (interstitial lung disease)   . COPD, moderate   . Stroke     Thalamic   History reviewed. No pertinent past surgical history.  History reviewed. No pertinent family history.  Allergies  Allergen Reactions  . Floxin (Ofloxacin) Other (See Comments)    Lost feeling in feet  . Penicillins Other (See Comments)    Ulcers in mouth  . Sulfa Antibiotics Other (See Comments)    Ulcers in mouth   Current Outpatient Prescriptions  Medication Sig Dispense Refill  . albuterol (PROVENTIL HFA;VENTOLIN HFA) 108 (90 BASE) MCG/ACT inhaler Inhale 2 puffs into the lungs every 6 (six) hours as needed for wheezing.  1 Inhaler  6  . amLODipine (NORVASC) 5 MG tablet Once a day      . aspirin 81 MG tablet Take 81 mg by mouth daily.      Marland Kitchen atorvastatin (LIPITOR) 10 MG tablet Take 10 mg by mouth daily.        Marland Kitchen dutasteride (AVODART) 0.5 MG capsule Take 0.5 mg by mouth daily.      Marland Kitchen losartan (COZAAR) 50 MG tablet Take  50 mg by mouth daily.        . pantoprazole (PROTONIX) 40 MG tablet Take 1 tablet (40 mg total) by mouth daily.  30 tablet  11  . Spacer/Aero Chamber Mouthpiece MISC 1 applicator by Does not apply route once.  1 each  1  . Tamsulosin HCl (FLOMAX) 0.4 MG CAPS Take 0.4 mg by mouth daily.      Marland Kitchen tiotropium (SPIRIVA) 18 MCG inhalation capsule Place 1 capsule (18 mcg total) into inhaler and inhale daily.  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  HENT: Negative for hearing loss, ear pain, nosebleeds, congestion, sore throat, facial swelling, rhinorrhea, sneezing, mouth sores, trouble swallowing, neck pain, neck stiffness, dental problem, voice change, postnasal drip, sinus pressure, tinnitus and ear discharge.   Eyes: Negative for visual disturbance.  Respiratory: Positive for cough, shortness of breath and wheezing. Negative for choking and chest tightness.   Cardiovascular: Negative for chest pain and palpitations.  Gastrointestinal: Negative for nausea, vomiting, abdominal pain, constipation, blood in stool and abdominal distention.  Genitourinary: Negative for difficulty urinating.  Musculoskeletal: Negative  for myalgias, back pain, joint swelling, arthralgias and gait problem.  Skin: Negative for color change, pallor and rash.  Neurological: Negative for dizziness, tremors, weakness, light-headedness and headaches.  Hematological: Does not bruise/bleed easily.  Psychiatric/Behavioral: Negative for confusion, disturbed wake/sleep cycle and agitation. The patient is not nervous/anxious.        Objective:   Physical Exam BP 130/68  Pulse 68  Temp 97.7 F (36.5 C) (Oral)  Ht 5\' 7"  (1.702 m)  Wt 160 lb (72.576 kg)  BMI 25.06 kg/m2   SpO2 91% General: Comfortable, frail looking  HEENT ; pupils round and reactive to light; no nasal mucosa edema and erythema; oropharynx with no erythema, edema or exudate; no cervical LAD  Cardiovascular: s1s2, no murmurs. Regular rate and rhythm.  Respiratory: Decreased breath sounds bilaterally with no wheezing or rhonchi; scatter basilar crackles; No increased work of breathing.  Abdomen: soft NT ND BS+.   Extremities: no pedal edema. Homans negative  Central nervous system: Alert and oriented. He does have balance problems falling to the R with ambulation; walks with a cane;  Skin no rash  Musculoskeletal decreased muscle tone;    2 D echo 05/19/2007  SUMMARY - Overall left ventricular systolic function was normal. Left ventricular ejection fraction was estimated , range being 65 % to 70 %. There were no left ventricular regional wall motion abnormalities. Left ventricular wall thickness was mildly increased. - The aortic valve was mildly calcified. There was trivial aortic valvular regurgitation. - Mean transmitral gradient was 3 mmHg. Mitral valve area by pressure half-time was 5 cm^2. - The estimated peak right ventricular systolic pressure was mild to moderately increased. Estimated peak right ventricular systolic pressure was in the range of 35 mmHg to 45 mmHg.  2 D echo 03/2012 Study Conclusions  - Left ventricle: The cavity size was normal. Wall thickness was normal. Doppler parameters are consistent with abnormal left ventricular relaxation (grade 1 diastolic dysfunction). - Aortic valve: AV is thckened, calcified with mildly restricted motion. Peak and mean gradients through the valve are 31 and 17 mm Hg consistent with mild AS. - Pulmonary arteries: PA peak pressure: 51mm Hg (S).    CT chest 2000 IMPRESSION:  1. THE PROCESS DESCRIBED IN THE RIGHT UPPER LOBE IS CONSISTENT WITH SCARRING. SINCE WE HAVE NO  PRIOR FILMS TO DOCUMENT STABILITY OF THIS PROCESS I  WOULD RECOMMEND A FOLLOW-UP PA AND LATERAL  CHEST FILM IN APPROXIMATELY 3 MONTHS.  2. MODERATE EMPHYSEMA IN BOTH UPPER LUNG ZONES.        CXR 03/09/2012 personally reviewed The patient has chronic interstitial and obstructive lung  disease with blebs at the right apex. No acute infiltrates or  effusions. Heart size and vascularity are normal. No acute  osseous abnormality.  MRI 03/04/2011  Generalized atrophy. Mild to moderate ventricular  enlargement, unchanged from the prior study. Chronic ischemic  changes in the white matter bilaterally. Chronic infarct in the  left thalamus and in the pons bilaterally, with progression from  the prior MRI. Negative for acute infarct. Negative for  hemorrhage or mass lesion.   CT chest 03/2012 independently reviewed:  1. Moderate to marked centrilobular emphysema.  2. Bibasilar interstitial lung disease. Favor a somewhat atypical  appearance of usual interstitial pneumonitis/pulmonary fibrosis.  Chronic aspiration could look similar.  3. Pulmonary artery enlargement suggests pulmonary arterial  hypertension.  4. 4 mm right lower lobe and possible 4 mm right upper lobe lung  nodules. Given risk factors for bronchogenic carcinoma,  follow-up  chest CT at 1 year is recommended. This recommendation follows the  consensus statement: "Guidelines for Management of Small Pulmonary  Nodules Detected on CT Scans: A Statement from the Fleischner  Society" as published in Radiology 2005; 237:395-400. Available  online at: DietDisorder.cz.  5. Moderate hiatal hernia.  6. Mild nonacute superior endplate compression deformity T12 with  minimal canal encroachment.   CT chest 07/11/2012 independently reviewed IMPRESSION:  1. Multifactorial lung disease redemonstrated, as detailed above.  There continues to be a combination of moderate to severe  centrilobular and paraseptal emphysema, as well as a background of   interstitial lung disease. The pattern of the interstitial lung  disease is nonspecific. While there is a clear craniocaudal  gradient, which is typically seen with usual interstitial pneumonia  (UIP), the lack of frank honeycombing and traction bronchiectasis,  and lack of temporal progression may favor nonspecific interstitial  pneumonia (NSIP). Attention on follow-up high-resolution chest CT  to evaluate for temporal progression or consideration for open lung  biopsy for better characterization of this disease may be  warranted.  2. Atherosclerosis, including left main and three-vessel coronary  artery disease.  3. Small hiatal hernia.      03/23/12                        04/20/2012             05/04/12                                                     07/13/2011                                      FVC 3.03 L 81%           2.81L 76%             2.87 81% (pre) 3.13 89% (post)                3.13 89% FEV1 1.77L 64%          1.58L 57%             1.69 79%(pre) 1.78 83% (post)                 1.79 83% FEV1/FVC 58                56%                        57  TLC  3.9 70%                                                         5.6 101%                                                                                                  RV  35%                                                                                                                             ERV 87%                                                           RV/TLC 24                                                           DLCO 64                                                                 9.0 61%                                                 Assessment & Plan:    Dyspnea  Due to a combination of COPD,  ILD, aortic stenosis and pulmonary hypertension. Walk for desaturation showed need for  supplemental oxygen. At rest the patient's oxygen sat ranged from 88-90, with ambulation he required today 2L of oxygen via nasal canula to maintain O2Sat >88%. I also advised him to enrol in pulmonary rehabilitation, pulmonary hypertension protocol.   Aortic stenosis  Saw Dr. Elray Mcgregor in cardiology clinic. Follow  up as per their recommendation.   Pulmonary Hypertension Most likely multifactorial secondary to COPD/ILD cor pulmonale and left heart disease/ aortic stenosis. I reiterated the importance of wearing oxygen.  I I would not initiate systemic treatment fro pulmonary hypertension given his severe parenchymal lung disease emphysema and ILD.   LE venous dopplers negative; autoimmune ANA, RhF negative, TSH normal.    ILD Non characteristic for UIP or NSIP. The CT changes are very atypical. So far the ANA, RhF are negative. The pattern is more of a micronodular interlobular and peri bronchovascular pattern. The DDx is very broad. However, the interstitial disease seems to be stable over the past several months; PFT in similar range; CT chest unchanged. I would recommend no further work up other than spirometry as needed for respiratory symptoms and every 6 months; He is not a candidate for open lung biopsy; I would not perform this procedure; given age and co morbidities, risk out waits the benefit.    Hiatal hernia and possible aspiration Given the history of stroke and CT chest pattern aspiration is of concern. Swallow evaluation showed evidence of aspiration which cleared with small single bites/sips. I discussed this finding with the patient and his daughter and we agreed to continue regular diet. They understand that there is a risk of aspiration complications, given the patient's age and multiple medical problems we agreed that in the overall picture continuing regular diet with the associated risk is beneficial for patient's quality of life and we are not instituting diet restrictions.     4 mm nodules  Might benefit from one 1 year CT follow up.    COPD moderate  See above spirometry; continue Albuterol and Spiriva. Initiate supplemental oxygen and pulmonary rehabilitation.     Previous evidence of chronic thalamic strokes and moderate ventricular enlargement. The patient complains of gait instability and vertigo limiting his exercise ability.  MRI with generalized atrophy, mild to moderate ventricular enlargement and chronic ischemic  changes in the white matter bilaterally. Chronic infarct in the  left thalamus and in the pons bilaterally, with progression from  the prior MRI. I advised the patient and his daughter to see a neurologist, however at this time they would like to postpone this visit. He does have short memory loss, but overall remains functional and active.   General health: received the flu and pneumococcus vaccine.    Disposition We will see the patient back in 3-4 months. In the interim I advised him to call with any questions or changes in the health status.    Jim Contreras has a very complex array of medical issues. His has an increased risk for complications, high morbidity and mortality; poor prognosis;  In case of acute respiratory compromise, I would advise against intubation and mechanical ventilation.  I initiated code status discussions with the family in clinic; in case of hospitalization code status will need to be re addressed with the patient and his family.   Vanetta Mulders, MD  [facility]  [place], [place]

## 2012-07-12 NOTE — Patient Instructions (Addendum)
Continue your current medications.   We will schedule you to see Dr. Delford Field in 3-4 months.

## 2012-07-12 NOTE — Progress Notes (Signed)
PFT done today. 

## 2012-07-13 ENCOUNTER — Telehealth: Payer: Self-pay | Admitting: Pulmonary Disease

## 2012-07-13 NOTE — Telephone Encounter (Signed)
Per Rhonda's conversation with Selena Batten @ [facility]: Resp Therapist went to pt's home on 05-10-12 to eval for conserving device - pt was put on 3L pulse - sats dropped to 86%  - pt was placed on 2L continuous and recovered @ 96%. Pt is not a good candidate for portable concentrator.

## 2012-07-13 NOTE — Telephone Encounter (Signed)
Mindy,   There are portable concentrators that provide continuous flow at 2 or 3 lpm. Can they provide one of those?

## 2012-07-13 NOTE — Telephone Encounter (Signed)
Please advise Dr. Frederico Hamman. thanks

## 2012-07-14 NOTE — Telephone Encounter (Signed)
Ok, it might be a good idea to ask Jim Contreras what he would prefer. Respironics simply go weights 11 lbs but i think it might be smaller and maybe easier to cary around than a tank, could he try one?

## 2012-07-14 NOTE — Telephone Encounter (Signed)
There are 4 portable concentrators that provide continuous flow on 2 or 3 liters. The problem with this is that these concentrators with the battery weighs 10 lbs and more. The patient is on the m-6 tanks which weigh 3 lbs. The portable concentrators are not small either, they are about the size of a small cooler and would weigh probably more than he could carry. Please advise. Rhonda J Cobb

## 2012-07-14 NOTE — Telephone Encounter (Signed)
Dr. Frederico Hamman, pls advise.  Thank you.

## 2012-07-14 NOTE — Telephone Encounter (Signed)
Please advise [facility];s thanks 

## 2012-07-17 NOTE — Telephone Encounter (Signed)
Called and spoke with patient who would like to be shown the portable concentrator that provides 2 lpm continuous flow. Pt can not tolerate pulsed dose.  Spoke with Shelly at [facility] and she will arrange to show patient the portable concentrator. Referral faxed to [facility] and [facility] will contact patient to arrange appointment. Rhonda J Cobb

## 2012-08-28 ENCOUNTER — Telehealth: Payer: Self-pay | Admitting: Pulmonary Disease

## 2012-08-28 NOTE — Telephone Encounter (Signed)
Message sent to [facility] to investigate the order.

## 2012-08-29 NOTE — Telephone Encounter (Signed)
ref[facility]ed pul rehab to [facility] and left pt's daughter's# for staff to call her Tobe Sos

## 2012-09-08 ENCOUNTER — Telehealth: Payer: Self-pay | Admitting: Pulmonary Disease

## 2012-09-08 NOTE — Telephone Encounter (Signed)
I called pulm rehab and spoke with nurse and she states she does have the order for the pt and I gave her daughter contact number and advised to only contact he daughter. Nurse states that they have some openings in the afternoon classes. She states she will call the daughter today to set this up. Carron Curie, CMA

## 2012-09-11 ENCOUNTER — Telehealth: Payer: Self-pay | Admitting: Critical Care Medicine

## 2012-09-11 NOTE — Telephone Encounter (Signed)
This is fine 

## 2012-09-19 ENCOUNTER — Encounter (HOSPITAL_COMMUNITY): Payer: Self-pay

## 2012-09-19 ENCOUNTER — Encounter (HOSPITAL_COMMUNITY)
Admission: RE | Admit: 2012-09-19 | Discharge: 2012-09-19 | Disposition: A | Discharge status: Home / Self Care | Payer: Medicare Other | Source: Ambulatory Visit | Attending: Pulmonary Disease | Admitting: Pulmonary Disease

## 2012-09-19 NOTE — Progress Notes (Signed)
Pt participated in pulmonary rehab orientation today.  Pt oriented to program guidelines and participant expectations.  Pt instructed in purse-lip breathing and demonstrated understanding.   Pt alert and oriented, slight noticeable short term memory issues, which patient and daughter report are same as usual,  well kept, appears same as his  stated age.   normal skin color.   Pt lungs course, scattered wheeze throughout.Marland Kitchen Heart regular rate and rhythm.  Bowel sounds active x4.  Equal grip strength and lower extremity strength.  No pedal edema present.  VSS.  PHQ-9 score of 6 .  Pt reports these symptoms are related to his health and inability to function as he once did.  Appropriate achievable goals self identified by patient.  Pt and daughter are both concerned about his balance. Long conversion about importance of using assistive devices preferably walker at home to prevent falls.  Also discussed possibility of BOAST program.  Pt and dtr unsure of home medications, pt will bring list on first day.  Pt has oxygen for home use however reports he doesn'troutinely use.  Pt also states he has not taken spiriva "in months".  Pt instructed importance of use and instructed to begin using as directed.  Pt verbalized understanding.  Pt scheduled to begin program 09/21/12 @130pm    .  6 minute walk test will be given today, pt tolerated appropriately.

## 2012-09-26 ENCOUNTER — Telehealth (HOSPITAL_COMMUNITY): Payer: Self-pay | Admitting: Cardiac Rehabilitation

## 2012-09-26 NOTE — Telephone Encounter (Signed)
pc to [facility] discuss Dr Frederico Hamman signing referral for pt to go to BOAST program before pulmonary rehab.  Per Dr. Liliane Channel office staff, Dr. Frederico Hamman is no longer taking care of patients in the office. Pt dtr notified  I will contact pt PCP for BOAST referral.  Pt dtr unsure who PCP is because he has recently changed, she will call us back with the name.

## 2012-10-09 ENCOUNTER — Telehealth: Payer: Self-pay | Admitting: Critical Care Medicine

## 2012-10-09 DIAGNOSIS — J449 Chronic obstructive pulmonary disease, unspecified: Secondary | ICD-10-CM

## 2012-10-09 NOTE — Telephone Encounter (Signed)
Spoke with Bonita Quin-- Per Bonita Quin patient was referred to [facility] rehab by Dr. Frederico Hamman At [facility] rehab she was told patient is way to unsteady on his feet and should first go to the BOOST program Patient needing referral to this program first Dr. Delford Field please advise if this referral can be placed. Thank You  Last OV:07/12/12 e Dr. Frederico Hamman Next OV: 11/10/12 w Dr. Delford Field

## 2012-10-09 NOTE — Telephone Encounter (Signed)
Order has been placed. Pt's daughter is aware.

## 2012-10-09 NOTE — Telephone Encounter (Signed)
i am ok with referral to BOOST program

## 2012-10-09 NOTE — Telephone Encounter (Signed)
States this program is coordinated with [facility] through [facility]

## 2012-10-11 ENCOUNTER — Ambulatory Visit: Payer: Medicare Other | Attending: Critical Care Medicine | Admitting: Physical Therapy

## 2012-10-11 DIAGNOSIS — M6281 Muscle weakness (generalized): Secondary | ICD-10-CM | POA: Insufficient documentation

## 2012-10-11 DIAGNOSIS — R269 Unspecified abnormalities of gait and mobility: Secondary | ICD-10-CM | POA: Insufficient documentation

## 2012-10-11 DIAGNOSIS — IMO0001 Reserved for inherently not codable concepts without codable children: Secondary | ICD-10-CM | POA: Insufficient documentation

## 2012-10-11 DIAGNOSIS — Z9181 History of falling: Secondary | ICD-10-CM | POA: Insufficient documentation

## 2012-10-13 ENCOUNTER — Ambulatory Visit: Payer: Medicare Other | Attending: Critical Care Medicine | Admitting: *Deleted

## 2012-10-13 DIAGNOSIS — R269 Unspecified abnormalities of gait and mobility: Secondary | ICD-10-CM | POA: Insufficient documentation

## 2012-10-13 DIAGNOSIS — IMO0001 Reserved for inherently not codable concepts without codable children: Secondary | ICD-10-CM | POA: Insufficient documentation

## 2012-10-13 DIAGNOSIS — M6281 Muscle weakness (generalized): Secondary | ICD-10-CM | POA: Insufficient documentation

## 2012-10-13 DIAGNOSIS — Z9181 History of falling: Secondary | ICD-10-CM | POA: Insufficient documentation

## 2012-10-25 ENCOUNTER — Ambulatory Visit: Payer: Medicare Other | Admitting: *Deleted

## 2012-10-27 ENCOUNTER — Ambulatory Visit: Payer: Medicare Other | Admitting: *Deleted

## 2012-11-01 ENCOUNTER — Encounter: Payer: Medicare Other | Admitting: *Deleted

## 2012-11-01 ENCOUNTER — Ambulatory Visit: Payer: Medicare Other | Attending: Critical Care Medicine

## 2012-11-01 DIAGNOSIS — R269 Unspecified abnormalities of gait and mobility: Secondary | ICD-10-CM | POA: Insufficient documentation

## 2012-11-01 DIAGNOSIS — IMO0001 Reserved for inherently not codable concepts without codable children: Secondary | ICD-10-CM | POA: Insufficient documentation

## 2012-11-01 DIAGNOSIS — Z9181 History of falling: Secondary | ICD-10-CM | POA: Insufficient documentation

## 2012-11-01 DIAGNOSIS — M6281 Muscle weakness (generalized): Secondary | ICD-10-CM | POA: Insufficient documentation

## 2012-11-03 ENCOUNTER — Encounter: Payer: Medicare Other | Admitting: *Deleted

## 2012-11-03 ENCOUNTER — Ambulatory Visit: Payer: Medicare Other | Admitting: Physical Therapy

## 2012-11-07 ENCOUNTER — Encounter: Payer: Medicare Other | Admitting: Physical Therapy

## 2012-11-07 ENCOUNTER — Ambulatory Visit: Payer: Medicare Other

## 2012-11-08 ENCOUNTER — Encounter: Payer: Medicare Other | Admitting: Physical Therapy

## 2012-11-09 ENCOUNTER — Encounter: Payer: Medicare Other | Admitting: Physical Therapy

## 2012-11-09 ENCOUNTER — Ambulatory Visit: Payer: Medicare Other

## 2012-11-10 ENCOUNTER — Encounter: Payer: Self-pay | Admitting: Critical Care Medicine

## 2012-11-10 ENCOUNTER — Ambulatory Visit (INDEPENDENT_AMBULATORY_CARE_PROVIDER_SITE_OTHER): Payer: Medicare Other | Admitting: Critical Care Medicine

## 2012-11-10 VITALS — BP 112/60 | HR 66 | Temp 97.5°F | Ht 65.5 in | Wt 154.0 lb

## 2012-11-10 DIAGNOSIS — I2789 Other specified pulmonary heart diseases: Secondary | ICD-10-CM

## 2012-11-10 DIAGNOSIS — J449 Chronic obstructive pulmonary disease, unspecified: Secondary | ICD-10-CM

## 2012-11-10 DIAGNOSIS — J849 Interstitial pulmonary disease, unspecified: Secondary | ICD-10-CM

## 2012-11-10 DIAGNOSIS — I272 Pulmonary hypertension, unspecified: Secondary | ICD-10-CM

## 2012-11-10 DIAGNOSIS — J8410 Pulmonary fibrosis, unspecified: Secondary | ICD-10-CM

## 2012-11-10 LAB — PULMONARY FUNCTION TEST

## 2012-11-10 NOTE — Progress Notes (Signed)
PFT done today. 

## 2012-11-10 NOTE — Patient Instructions (Addendum)
Stay on Spiriva daily No other medication changes A lighter weight portable oxygen system will be obtained Return 4 months

## 2012-11-10 NOTE — Progress Notes (Signed)
  Subjective:    Patient ID: Jim Contreras, male    DOB: 1924/03/25, 79 y.o.   MRN: [id number]  HPI 79 yo with complicated medical history including COPD with diffuse emphysema, ILD/NSIP with a lower lobe distribution atypical for UIP, mild AS and pulmonary hypertension.    11/10/2012 F/u Copd, ILD, NSIP, Pulm HTN secondary to both, chronic resp failure. Last seen 06/2012 by Dr Frederico Hamman who is leaving the practice.  Here for f/u OV Only uses oxygen at home 2Liters, no sleeping with the oxygen and only use is at home   Review of Systems Constitutional:   No  weight loss, night sweats,  Fevers, chills, fatigue, lassitude. HEENT:   No headaches,  Difficulty swallowing,  Tooth/dental problems,  Sore throat,                No sneezing, itching, ear ache, nasal congestion, post nasal drip,   CV:  No chest pain,  Orthopnea, PND, swelling in lower extremities, anasarca, dizziness, palpitations  GI  No heartburn, indigestion, abdominal pain, nausea, vomiting, diarrhea, change in bowel habits, loss of appetite  Resp: No shortness of breath with exertion or at rest.  No excess mucus, no productive cough,  No non-productive cough,  No coughing up of blood.  No change in color of mucus.  No wheezing.  No chest wall deformity  Skin: no rash or lesions.  GU: no dysuria, change in color of urine, no urgency or frequency.  No flank pain.  MS:  No joint pain or swelling.  No decreased range of motion.  No back pain.  Psych:  No change in mood or affect. No depression or anxiety.  No memory loss.     Objective:   Physical Exam Filed Vitals:   11/10/12 1412  BP: 112/60  Pulse: 66  Temp: 97.5 F (36.4 C)  TempSrc: Oral  Height: 5' 5.5" (1.664 m)  Weight: 154 lb (69.854 kg)  SpO2: 91%    Gen: Pleasant, well-nourished, in no distress,  normal affect  ENT: No lesions,  mouth clear,  oropharynx clear, no postnasal drip  Neck: No JVD, no TMG, no carotid bruits  Lungs: No use of accessory  muscles, no dullness to percussion, distant BS, dry rales  Cardiovascular: RRR, heart sounds normal, no murmur or gallops, no peripheral edema  Abdomen: soft and NT, no HSM,  BS normal  Musculoskeletal: No deformities, no cyanosis or clubbing  Neuro: alert, non focal  Skin: Warm, no lesions or rashes  No results found.   PFT 07/12/12:  Fev1 84%  TLC 101%  DLCO 61% PFT 11/10/12: FeV1 87% FeV1/FVC 86%  TLC 84%    DLCO 37%      Assessment & Plan:

## 2012-11-11 NOTE — Assessment & Plan Note (Signed)
Pulmonary HTN secondary d/t copd/ NSIP/UIP Plan No change in spiriva Cont oxygen therapy

## 2012-11-11 NOTE — Assessment & Plan Note (Signed)
ILD NSIP  On no prednisone Stable PFTs Cont oxygen Rx Get light weight port oxygen

## 2012-11-13 ENCOUNTER — Ambulatory Visit: Payer: Medicare Other

## 2012-11-15 ENCOUNTER — Ambulatory Visit: Payer: Medicare Other | Attending: Critical Care Medicine

## 2012-11-15 DIAGNOSIS — Z9181 History of falling: Secondary | ICD-10-CM | POA: Insufficient documentation

## 2012-11-15 DIAGNOSIS — R269 Unspecified abnormalities of gait and mobility: Secondary | ICD-10-CM | POA: Insufficient documentation

## 2012-11-15 DIAGNOSIS — IMO0001 Reserved for inherently not codable concepts without codable children: Secondary | ICD-10-CM | POA: Insufficient documentation

## 2012-11-15 DIAGNOSIS — M6281 Muscle weakness (generalized): Secondary | ICD-10-CM | POA: Insufficient documentation

## 2012-11-17 ENCOUNTER — Encounter: Payer: Self-pay | Admitting: Critical Care Medicine

## 2012-11-20 ENCOUNTER — Ambulatory Visit: Payer: Medicare Other | Admitting: Physical Therapy

## 2012-11-22 ENCOUNTER — Ambulatory Visit: Payer: Medicare Other

## 2012-11-23 ENCOUNTER — Encounter: Payer: Self-pay | Admitting: Critical Care Medicine

## 2012-11-27 ENCOUNTER — Ambulatory Visit: Payer: Medicare Other

## 2012-11-29 ENCOUNTER — Ambulatory Visit: Payer: Medicare Other | Admitting: Physical Therapy

## 2012-12-04 ENCOUNTER — Ambulatory Visit: Payer: Medicare Other | Admitting: Physical Therapy

## 2012-12-06 ENCOUNTER — Ambulatory Visit: Payer: Medicare Other | Admitting: Physical Therapy

## 2012-12-19 ENCOUNTER — Ambulatory Visit: Payer: Medicare Other | Attending: Critical Care Medicine | Admitting: Physical Therapy

## 2012-12-19 DIAGNOSIS — Z9181 History of falling: Secondary | ICD-10-CM | POA: Insufficient documentation

## 2012-12-19 DIAGNOSIS — IMO0001 Reserved for inherently not codable concepts without codable children: Secondary | ICD-10-CM | POA: Insufficient documentation

## 2012-12-19 DIAGNOSIS — R269 Unspecified abnormalities of gait and mobility: Secondary | ICD-10-CM | POA: Insufficient documentation

## 2012-12-19 DIAGNOSIS — M6281 Muscle weakness (generalized): Secondary | ICD-10-CM | POA: Insufficient documentation

## 2012-12-27 ENCOUNTER — Ambulatory Visit: Payer: Medicare Other

## 2012-12-31 ENCOUNTER — Emergency Department (HOSPITAL_COMMUNITY): Payer: Medicare Other

## 2012-12-31 ENCOUNTER — Encounter (HOSPITAL_COMMUNITY): Payer: Self-pay | Admitting: Internal Medicine

## 2012-12-31 ENCOUNTER — Observation Stay (HOSPITAL_COMMUNITY)
Admission: EM | Admit: 2012-12-31 | Discharge: 2013-01-02 | Disposition: A | Discharge status: Skilled Nursing Facility | Payer: Medicare Other | Attending: Internal Medicine | Admitting: Internal Medicine

## 2012-12-31 DIAGNOSIS — R4182 Altered mental status, unspecified: Secondary | ICD-10-CM | POA: Insufficient documentation

## 2012-12-31 DIAGNOSIS — H81399 Other peripheral vertigo, unspecified ear: Principal | ICD-10-CM | POA: Insufficient documentation

## 2012-12-31 DIAGNOSIS — N400 Enlarged prostate without lower urinary tract symptoms: Secondary | ICD-10-CM | POA: Insufficient documentation

## 2012-12-31 DIAGNOSIS — R4189 Other symptoms and signs involving cognitive functions and awareness: Secondary | ICD-10-CM

## 2012-12-31 DIAGNOSIS — R55 Syncope and collapse: Secondary | ICD-10-CM | POA: Insufficient documentation

## 2012-12-31 DIAGNOSIS — R0600 Dyspnea, unspecified: Secondary | ICD-10-CM

## 2012-12-31 DIAGNOSIS — E785 Hyperlipidemia, unspecified: Secondary | ICD-10-CM | POA: Insufficient documentation

## 2012-12-31 DIAGNOSIS — J439 Emphysema, unspecified: Secondary | ICD-10-CM | POA: Diagnosis present

## 2012-12-31 DIAGNOSIS — J449 Chronic obstructive pulmonary disease, unspecified: Secondary | ICD-10-CM

## 2012-12-31 DIAGNOSIS — Z602 Problems related to living alone: Secondary | ICD-10-CM | POA: Insufficient documentation

## 2012-12-31 DIAGNOSIS — Z9981 Dependence on supplemental oxygen: Secondary | ICD-10-CM | POA: Insufficient documentation

## 2012-12-31 DIAGNOSIS — J8410 Pulmonary fibrosis, unspecified: Secondary | ICD-10-CM | POA: Insufficient documentation

## 2012-12-31 DIAGNOSIS — I272 Pulmonary hypertension, unspecified: Secondary | ICD-10-CM

## 2012-12-31 DIAGNOSIS — Z79899 Other long term (current) drug therapy: Secondary | ICD-10-CM | POA: Insufficient documentation

## 2012-12-31 DIAGNOSIS — Z9181 History of falling: Secondary | ICD-10-CM | POA: Insufficient documentation

## 2012-12-31 DIAGNOSIS — I10 Essential (primary) hypertension: Secondary | ICD-10-CM | POA: Insufficient documentation

## 2012-12-31 DIAGNOSIS — K920 Hematemesis: Secondary | ICD-10-CM | POA: Insufficient documentation

## 2012-12-31 DIAGNOSIS — I359 Nonrheumatic aortic valve disorder, unspecified: Secondary | ICD-10-CM | POA: Insufficient documentation

## 2012-12-31 DIAGNOSIS — W19XXXS Unspecified fall, sequela: Secondary | ICD-10-CM

## 2012-12-31 DIAGNOSIS — G319 Degenerative disease of nervous system, unspecified: Secondary | ICD-10-CM

## 2012-12-31 DIAGNOSIS — J4489 Other specified chronic obstructive pulmonary disease: Secondary | ICD-10-CM | POA: Insufficient documentation

## 2012-12-31 DIAGNOSIS — J849 Interstitial pulmonary disease, unspecified: Secondary | ICD-10-CM

## 2012-12-31 DIAGNOSIS — Z23 Encounter for immunization: Secondary | ICD-10-CM

## 2012-12-31 DIAGNOSIS — R42 Dizziness and giddiness: Secondary | ICD-10-CM

## 2012-12-31 DIAGNOSIS — I350 Nonrheumatic aortic (valve) stenosis: Secondary | ICD-10-CM | POA: Diagnosis present

## 2012-12-31 DIAGNOSIS — I639 Cerebral infarction, unspecified: Secondary | ICD-10-CM

## 2012-12-31 DIAGNOSIS — W19XXXA Unspecified fall, initial encounter: Secondary | ICD-10-CM

## 2012-12-31 LAB — CBC WITH DIFFERENTIAL/PLATELET
Basophils Absolute: 0 10*3/uL (ref 0.0–0.1)
Basophils Relative: 0 % (ref 0–1)
Eosinophils Relative: 0 % (ref 0–5)
HCT: 47.1 % (ref 39.0–52.0)
MCHC: 33.5 g/dL (ref 30.0–36.0)
MCV: 85.9 fL (ref 78.0–100.0)
Monocytes Absolute: 1.3 10*3/uL — ABNORMAL HIGH (ref 0.1–1.0)
RDW: 14.6 % (ref 11.5–15.5)

## 2012-12-31 LAB — APTT: aPTT: 31 seconds (ref 24–37)

## 2012-12-31 LAB — COMPREHENSIVE METABOLIC PANEL
AST: 60 U/L — ABNORMAL HIGH (ref 0–37)
Albumin: 3.4 g/dL — ABNORMAL LOW (ref 3.5–5.2)
Calcium: 9.2 mg/dL (ref 8.4–10.5)
Creatinine, Ser: 0.84 mg/dL (ref 0.50–1.35)
Total Protein: 6.8 g/dL (ref 6.0–8.3)

## 2012-12-31 LAB — URINALYSIS, ROUTINE W REFLEX MICROSCOPIC
Glucose, UA: NEGATIVE mg/dL
Specific Gravity, Urine: >1.03 — ABNORMAL HIGH (ref 1.005–1.030)
Urobilinogen, UA: 1 mg/dL (ref 0.0–1.0)

## 2012-12-31 LAB — POCT I-STAT TROPONIN I: Troponin i, poc: 0 ng/mL (ref 0.00–0.08)

## 2012-12-31 LAB — URINE MICROSCOPIC-ADD ON

## 2012-12-31 LAB — TYPE AND SCREEN: ABO/RH(D): O POS

## 2012-12-31 MED ORDER — SODIUM CHLORIDE 0.9 % IV BOLUS (SEPSIS)
1000.0000 mL | Freq: Once | INTRAVENOUS | Status: AC
  Administered 2012-12-31: 1000 mL via INTRAVENOUS

## 2012-12-31 MED ORDER — ACETAMINOPHEN 325 MG PO TABS: 650.0000 mg | ORAL_TABLET | Freq: Four times a day (QID) | ORAL | Status: DC | PRN

## 2012-12-31 MED ORDER — ONDANSETRON HCL 4 MG/2ML IJ SOLN: 4.0000 mg | Freq: Four times a day (QID) | INTRAMUSCULAR | Status: DC | PRN

## 2012-12-31 MED ORDER — DUTASTERIDE 0.5 MG PO CAPS
0.5000 mg | ORAL_CAPSULE | Freq: Every day | ORAL | Status: DC
  Administered 2013-01-01 – 2013-01-02 (×2): 0.5 mg via ORAL
  Filled 2012-12-31 (×2): qty 1

## 2012-12-31 MED ORDER — ALBUTEROL SULFATE HFA 108 (90 BASE) MCG/ACT IN AERS: 2.0000 | INHALATION_SPRAY | Freq: Four times a day (QID) | RESPIRATORY_TRACT | Status: DC | PRN

## 2012-12-31 MED ORDER — TAMSULOSIN HCL 0.4 MG PO CAPS
0.4000 mg | ORAL_CAPSULE | Freq: Every day | ORAL | Status: DC
  Administered 2013-01-01: 0.4 mg via ORAL
  Filled 2012-12-31: qty 1

## 2012-12-31 MED ORDER — ALBUTEROL SULFATE HFA 108 (90 BASE) MCG/ACT IN AERS
2.0000 | INHALATION_SPRAY | Freq: Four times a day (QID) | RESPIRATORY_TRACT | Status: DC | PRN
Start: 2012-12-31 — End: 2012-12-31

## 2012-12-31 MED ORDER — SODIUM CHLORIDE 0.9 % IJ SOLN
3.0000 mL | Freq: Two times a day (BID) | INTRAMUSCULAR | Status: DC
  Administered 2013-01-01 – 2013-01-02 (×3): 3 mL via INTRAVENOUS

## 2012-12-31 MED ORDER — LOSARTAN POTASSIUM 50 MG PO TABS
50.0000 mg | ORAL_TABLET | Freq: Every day | ORAL | Status: DC
  Administered 2013-01-01 – 2013-01-02 (×2): 50 mg via ORAL
  Filled 2012-12-31 (×2): qty 1

## 2012-12-31 MED ORDER — PANTOPRAZOLE SODIUM 40 MG PO TBEC
40.0000 mg | DELAYED_RELEASE_TABLET | Freq: Every day | ORAL | Status: DC
  Administered 2013-01-01 – 2013-01-02 (×2): 40 mg via ORAL
  Filled 2012-12-31 (×2): qty 1

## 2012-12-31 MED ORDER — ALUM & MAG HYDROXIDE-SIMETH 200-200-20 MG/5ML PO SUSP: 30.0000 mL | Freq: Four times a day (QID) | ORAL | Status: DC | PRN

## 2012-12-31 MED ORDER — ATORVASTATIN CALCIUM 10 MG PO TABS
10.0000 mg | ORAL_TABLET | Freq: Every day | ORAL | Status: DC
Start: 2013-01-01 — End: 2013-01-02
  Administered 2013-01-01 – 2013-01-02 (×2): 10 mg via ORAL
  Filled 2012-12-31 (×2): qty 1

## 2012-12-31 MED ORDER — PANTOPRAZOLE SODIUM 40 MG IV SOLR
40.0000 mg | Freq: Once | INTRAVENOUS | Status: AC
  Administered 2012-12-31: 40 mg via INTRAVENOUS
  Filled 2012-12-31: qty 40

## 2012-12-31 MED ORDER — ACETAMINOPHEN 650 MG RE SUPP: 650.0000 mg | Freq: Four times a day (QID) | RECTAL | Status: DC | PRN

## 2012-12-31 MED ORDER — ONDANSETRON HCL 4 MG PO TABS: 4.0000 mg | ORAL_TABLET | Freq: Four times a day (QID) | ORAL | Status: DC | PRN

## 2012-12-31 MED ORDER — AMLODIPINE BESYLATE 5 MG PO TABS
5.0000 mg | ORAL_TABLET | Freq: Every day | ORAL | Status: DC
  Administered 2013-01-01 – 2013-01-02 (×2): 5 mg via ORAL
  Filled 2012-12-31 (×2): qty 1

## 2012-12-31 NOTE — ED Notes (Signed)
EMS called out by neighbors for un witnessed fall. Pt denies fall sts "he walked outside and woke up on the ground with neighbors standing around" pt had coffee ground emesis. Pt felt dizzy when sat up by EMS. Non tender abdomen. Daughter called (lives at [facility] [facility]) sts this is 3rd fall in 3 days. Pt has early dementia, and confusion.

## 2012-12-31 NOTE — ED Notes (Signed)
Pt asking to hold in and out cath. Given water and urinal. RN sts will be back in 5 min to check

## 2012-12-31 NOTE — ED Notes (Signed)
Pt unable to void requesting more time to attempt.

## 2012-12-31 NOTE — ED Notes (Signed)
Dawayne Patricia   (son-in-law)  [phone] (cell)** preference  [phone] (home)  Evalee Mutton  (daughter)   [phone]

## 2012-12-31 NOTE — H&P (Signed)
[facility] History and Physical  Jim Contreras  BMW:[id number]  DOB: August 15, 1923  DOA: 12/31/2012  Referring physician: Dr Loretha Stapler PCP: [name], MD   Chief Complaint: vertiginous dizziness and fall  HPI: Jim Contreras is a 79 y.o. male with Past medical history of hypertension and BPH and COPD presented by EMS with h/o fall. He has been having episodes of dizziness which he describes as things around him spinning and he falling down. Yesterday he reported a fall to [facility] as well. Today he was found in the backyard on the ground by EMS when neighbors called them.the pt felt that when he was walking out the trees were spinning around him and then he fell. No chest pain, palpitation, focal neurological deficit at the time of fall. Apparently the EMS also felt that he has lost consciousness although the pt denies it. Prior to the episode the pt denies any fever, chills, headache, nausea, vomiting, abdominal pain, diarrhea, constipation, active bleeding, chest pain, palpitation, pedal edema, orthopnea, PND, focal neurological deficit, burning urination, cough, shortness of breath. There is a history of black tarry stool and coffee ground emesis on arrival of EMS as well.  Review of Systems: as mentioned in the history of present illness.  A Comprehensive review of the other systems is negative.  Past Medical History  Diagnosis Date  . Hypertension   . Hypercholesteremia   . BPH (benign prostatic hyperplasia)   . ILD (interstitial lung disease)   . COPD, moderate   . Stroke     Thalamic   Past Surgical History  Procedure Laterality Date  . Cataract extraction Bilateral   . Appendectomy    . Hernia repair     Social History:  reports that he quit smoking about 41 years ago. His smoking use included Cigarettes. He has a 40 pack-year smoking history. He has never used smokeless tobacco. He reports that  drinks alcohol. He reports that he does not use illicit  drugs. Patient is coming from home. Patient can participate in ADLs.  Allergies  Allergen Reactions  . Floxin (Ofloxacin) Other (See Comments)    Lost feeling in feet  . Penicillins Other (See Comments)    Ulcers in mouth  . Sulfa Antibiotics Other (See Comments)    Ulcers in mouth    No family history on file.  Prior to Admission medications   Medication Sig Start Date End Date Taking? Authorizing Provider  albuterol (PROVENTIL HFA;VENTOLIN HFA) 108 (90 BASE) MCG/ACT inhaler Inhale 2 puffs into the lungs every 6 (six) hours as needed for wheezing or shortness of breath. 03/23/12  Yes Roxine Caddy, MD  amLODipine (NORVASC) 5 MG tablet Take 5 mg by mouth daily.  04/10/12  Yes Historical Provider, MD  aspirin 81 MG tablet Take 81 mg by mouth daily.   Yes Historical Provider, MD  atorvastatin (LIPITOR) 10 MG tablet Take 10 mg by mouth daily.     Yes Historical Provider, MD  dutasteride (AVODART) 0.5 MG capsule Take 0.5 mg by mouth daily.   Yes Historical Provider, MD  losartan (COZAAR) 50 MG tablet Take 50 mg by mouth daily.     Yes Historical Provider, MD  Spacer/Aero Chamber Mouthpiece MISC 1 applicator by Does not apply route once. 03/23/12  Yes Roxine Caddy, MD  Tamsulosin HCl (FLOMAX) 0.4 MG CAPS Take 0.4 mg by mouth daily.   Yes Historical Provider, MD    Physical Exam: Filed Vitals:   12/31/12 2130 12/31/12  2145 12/31/12 2200 12/31/12 2215  BP: 171/73 175/75 158/90 168/69  Pulse: 79 78 59 72  Temp:      TempSrc:      Resp: 18 25 20 22   SpO2: 97% 97% 96% 95%    General: Alert, Awake and Oriented to Time, Place and Person. Appear in mild distress Eyes: PERRL ENT: Oral Mucosa clear moist and pale. Neck: no JVD, no Carotid Bruits, no Stiffness Cardiovascular: S1 and S2 Present, Murmur present in aortic area, Peripheral Pulses Present Respiratory: Clear to Auscultation, Bilateral Air entry equal and Decreased Abdomen: Bowel Sound Present, Soft and Non tender  Extremities:  no Pedal edema , calf tenderness Neurologic: Mental status, Motor strength, Sensation, reflexes, Proprioception Grossly Unremarkable.  Labs on Admission:  Basic Metabolic Panel:  Recent Labs Lab 12/31/12 1819  NA 140  K 3.8  CL 103  CO2 22  GLUCOSE 116*  BUN 33*  CREATININE 0.84  CALCIUM 9.2   Liver Function Tests:  Recent Labs Lab 12/31/12 1819  AST 60*  ALT 22  ALKPHOS 61  BILITOT 0.6  PROT 6.8  ALBUMIN 3.4*   No results found for this basename: LIPASE, AMYLASE,  in the last 168 hours No results found for this basename: AMMONIA,  in the last 168 hours CBC:  Recent Labs Lab 12/31/12 1819  WBC 11.6*  NEUTROABS 9.8*  HGB 15.8  HCT 47.1  MCV 85.9  PLT 208   Cardiac Enzymes: No results found for this basename: CKTOTAL, CKMB, CKMBINDEX, TROPONINI,  in the last 168 hours  BNP (last 3 results) No results found for this basename: PROBNP,  in the last 8760 hours CBG: No results found for this basename: GLUCAP,  in the last 168 hours  Radiological Exams on Admission: Dg Ribs Bilateral W/chest  12/31/2012   *RADIOLOGY REPORT*  Clinical Data: Fall.  Bruising and bilateral anterior ribs.  Pain.  BILATERAL RIBS AND CHEST - 4+ VIEW  Comparison: CT chest 07/11/1012.  Findings: The heart size is normal.  Emphysematous change in interstitial lung disease is again noted.  Dedicated imaging of the ribs demonstrates no acute or healing fractures.  The visualized thoracolumbar spine is unremarkable.  IMPRESSION:  1.  No acute abnormality or significant interval change. 2.  No acute or healing fractures. 3.  Emphysema and chronic interstitial lung disease is again noted.   Original Report Authenticated By: Marin Roberts, M.D.   Ct Head Wo Contrast  12/31/2012   *RADIOLOGY REPORT*  Clinical Data: Unwitnessed fall.  Multiple recent falls.  CT HEAD WITHOUT CONTRAST  Technique:  Contiguous axial images were obtained from the base of the skull through the vertex without contrast.   Comparison: CT head without contrast 06/11/2011.  Findings: Moderate generalized atrophy and diffuse white matter disease is similar to the prior exams.  Ventricles are proportionate to the degree of atrophy.  Remote lacunar infarcts are evident within the thalami.  No acute cortical infarct, hemorrhage, mass lesion is present.  No significant extra-axial fluid collection is present.  A tiny fluid level is present in the left maxillary sinus.  No associated fractures evident.  The remaining paranasal sinuses and mastoid air cells are clear.  The osseous skull is intact.  No significant extracranial soft tissue injury is evident.  IMPRESSION:  1.  Stable atrophy and diffuse white matter disease. 2.  Tiny left maxillary fluid level without evidence for acute fracture.  This is likely related to sinus disease.   Original Report Authenticated By: Cristal Deer  Alfredo Batty, M.D.   EKG: Independently reviewed. No arrythmia no signs of acute ischemia  Assessment/Plan Principal Problem:   Fall Active Problems:   COPD (chronic obstructive pulmonary disease)   Aortic stenosis   Coffee ground emesis   Hypertension   Vertigo, peripheral   BPH (benign prostatic hyperplasia)   1. Fall and possible syncope with vertiginous dizziness. Less likely neurologic cause since the CT head is negative and pt does not have any FND. Since the pt does have h/o aortic stenosis that is a possibility for the syncope. More likely pt could have peripheral vertigo, no nystagmus on exam though. There is recent h/o worsening hearing on left side. At present i will admit under observation, serial troponin, tele and TTE. Also may need vestibular rehab/PT.  2. coffee ground emesis and questionable black tarry stool. hemoccult negative and Hb is WNL. At present there is no active bleed. We will follow Hb and if there is any active bleeding, we will do further work up. Adding protonix.  3. Hypertension At present orthostatic is  negative. Pt could have postural drop as well even though. Also has been on flomax. Currently blood pressure on stable. Continue home meds.  4. COPD Continue home medication.  DVT Prophylaxis: mechanical compression device Nutrition: cardiac diet  Code Status: full  Family Communication: son was at bedside.   Author: [name], MD [facility] Pager: [phone] 12/31/2012 11:17 PM    If 7PM-7AM, please contact night-coverage [email or web address] Password [email or web address]

## 2012-12-31 NOTE — ED Provider Notes (Signed)
History    CSN: [id number] Arrival date & time 12/31/12  1646  First MD Initiated Contact with Patient 12/31/12 1709     Chief Complaint  Patient presents with  . Fall   (Consider location/radiation/quality/duration/timing/severity/associated sxs/prior Treatment) The history is provided by the patient. No language interpreter was used.  OCIE TINO is a 79 y/o M with PMHx of HTN, HLD, BPH, ILD, COPD - on oxygen therapy at home, presenting to the ED brought in by EMS due to fall that occurred this afternoon. Stated that when he got up from a sitting position he became dizzy, grabbed onto the chair. Stated that he fell three times this week, unable to recall exactly when and how he fell. Stated that he gets dizzy whenever he turns around quickly. Stated that he felt nauseous before and that he had an episode of emesis. Reported that he has been having black, tarry stools for the past 2 weeks. Denied any pain anywhere - stated that he just feels weak and tired. Denied head injury, LOC, blurred vision, visual distortions, blurred vision, dizziness, chest pain, shortness of breath, difficulty breathing, NSAID use, stomach surgery.  Patient poor historian.  PCP: Dr. Kateri Plummer, [facility].    Past Medical History  Diagnosis Date  . Hypertension   . Hypercholesteremia   . BPH (benign prostatic hyperplasia)   . ILD (interstitial lung disease)   . COPD, moderate   . Stroke     Thalamic   Past Surgical History  Procedure Laterality Date  . Cataract extraction Bilateral   . Appendectomy    . Hernia repair     No family history on file. History  Substance Use Topics  . Smoking status: Former Smoker -- 1.00 packs/day for 40 years    Types: Cigarettes    Quit date: 06/15/1971  . Smokeless tobacco: Never Used  . Alcohol Use: Yes     Comment: sparingly    Review of Systems  Constitutional: Positive for fatigue. Negative for fever and chills.  HENT: Negative for neck  pain.   Eyes: Negative for visual disturbance.  Respiratory: Negative for chest tightness and shortness of breath.   Gastrointestinal: Positive for nausea and vomiting. Negative for abdominal pain, diarrhea and constipation.  Neurological: Positive for dizziness and weakness. Negative for numbness.  All other systems reviewed and are negative.    Allergies  Floxin; Penicillins; and Sulfa antibiotics  Home Medications   No current outpatient prescriptions on file. BP 124/63  Pulse 65  Temp(Src) 97.8 F (36.6 C) (Oral)  Resp 18  Ht 5' 8.4" (1.737 m)  Wt 145 lb 15.1 oz (66.2 kg)  BMI 21.94 kg/m2  SpO2 93% Physical Exam  Nursing note and vitals reviewed. Constitutional: He is oriented to person, place, and time. He appears well-developed and well-nourished. No distress.  HENT:  Head: Normocephalic and atraumatic.  Dried blood noted in mouth.   Eyes: Conjunctivae and EOM are normal. Pupils are equal, round, and reactive to light.  Neck: Normal range of motion. Neck supple.  Cardiovascular: Normal rate, regular rhythm and normal heart sounds.  Exam reveals no friction rub.   No murmur heard. Pulses:      Radial pulses are 2+ on the right side, and 2+ on the left side.       Dorsalis pedis pulses are 2+ on the right side, and 2+ on the left side.  Pulmonary/Chest: Effort normal and breath sounds normal. No respiratory distress. He has no  wheezes. He has no rales. He exhibits no tenderness.  Bilateral old ecchymosis noted to the rib 7 anterior aspect - negative pain upon palpation to the ribs  Abdominal: Soft. Bowel sounds are normal. He exhibits no distension. There is no tenderness. There is no rebound and no guarding.  Genitourinary: Rectum normal. Guaiac negative stool.  Rectal exam: Negative hemorrhoids, lesions, sores noted to the anus. Mild erythema noted. Negative masses, lesions palpated. Enlarged prostate noted - negative bogginess, tenderness, or inflammation noted upon  palpation. Strong sphincter tone noted. Stool brown - no blood noted.   Musculoskeletal: Normal range of motion.  Lymphadenopathy:    He has no cervical adenopathy.  Neurological: He is alert and oriented to person, place, and time. No cranial nerve deficit. GCS eye subscore is 4. GCS verbal subscore is 5. GCS motor subscore is 6.  Cranial nerves III-XII grossly intact Strength 5+/5+ with resistance  Skin: Skin is warm and dry. He is not diaphoretic.  Old ecchymosis noted to the left lateral malleolus. Old ecchymosis noted to the great toe and second toe of left foot.  Psychiatric: He has a normal mood and affect. His behavior is normal. Thought content normal.    ED Course  Procedures (including critical care time)  7:10PM  Spoke with daughter, Evalee Mutton, stated that she is concerned about her father. Stated that he has fallen approximately 3 times over the course of 3 days - once per day. Daughter reported that he fell yesterday. Daughter reported that she is concerned stated that he has been being more confused over the past couple of weeks - she stated that she has noticed that he has been forgetting a lot and she is concerned since he lives alone. Reported that he does have an aide that comes to see him 5 days a week from 9:00AM-12:00PM.   10:18PM Spoke with Dr. Leodis Binet from [facility] - patient to be admitted to the hospital for observation on telemetry. Recommended aPTT and INR to be performed - tests ordered.     Date: 12/31/2012  Rate: 71  Rhythm: normal sinus rhythm  QRS Axis: normal  Intervals: normal  ST/T Wave abnormalities: normal  Conduction Disutrbances:none  Narrative Interpretation: Artifacts noted.   Old EKG Reviewed: unchanged     Labs Reviewed  CBC WITH DIFFERENTIAL - Abnormal; Notable for the following:    WBC 11.6 (*)    Neutrophils Relative % 84 (*)    Neutro Abs 9.8 (*)    Lymphocytes Relative 5 (*)    Lymphs Abs 0.6 (*)    Monocytes Absolute  1.3 (*)    All other components within normal limits  COMPREHENSIVE METABOLIC PANEL - Abnormal; Notable for the following:    Glucose, Bld 116 (*)    BUN 33 (*)    Albumin 3.4 (*)    AST 60 (*)    GFR calc non Af Amer 77 (*)    GFR calc Af Amer 89 (*)    All other components within normal limits  URINALYSIS, ROUTINE W REFLEX MICROSCOPIC - Abnormal; Notable for the following:    APPearance CLOUDY (*)    Specific Gravity, Urine >1.030 (*)    Hgb urine dipstick LARGE (*)    Bilirubin Urine SMALL (*)    Ketones, ur 40 (*)    Protein, ur 30 (*)    Leukocytes, UA TRACE (*)    All other components within normal limits  URINE MICROSCOPIC-ADD ON - Abnormal; Notable for the  following:    Squamous Epithelial / LPF FEW (*)    Casts HYALINE CASTS (*)    All other components within normal limits  PROTIME-INR - Abnormal; Notable for the following:    Prothrombin Time 15.3 (*)    All other components within normal limits  BASIC METABOLIC PANEL - Abnormal; Notable for the following:    BUN 31 (*)    Calcium 8.3 (*)    GFR calc non Af Amer 79 (*)    All other components within normal limits  APTT  TROPONIN I  TROPONIN I  TSH  CBC  PROTIME-INR  POCT I-STAT TROPONIN I  OCCULT BLOOD, POC DEVICE  TYPE AND SCREEN  ABO/RH   Dg Ribs Bilateral W/chest  12/31/2012   *RADIOLOGY REPORT*  Clinical Data: Fall.  Bruising and bilateral anterior ribs.  Pain.  BILATERAL RIBS AND CHEST - 4+ VIEW  Comparison: CT chest 07/11/1012.  Findings: The heart size is normal.  Emphysematous change in interstitial lung disease is again noted.  Dedicated imaging of the ribs demonstrates no acute or healing fractures.  The visualized thoracolumbar spine is unremarkable.  IMPRESSION:  1.  No acute abnormality or significant interval change. 2.  No acute or healing fractures. 3.  Emphysema and chronic interstitial lung disease is again noted.   Original Report Authenticated By: Marin Roberts, M.D.   Ct Head Wo  Contrast  12/31/2012   *RADIOLOGY REPORT*  Clinical Data: Unwitnessed fall.  Multiple recent falls.  CT HEAD WITHOUT CONTRAST  Technique:  Contiguous axial images were obtained from the base of the skull through the vertex without contrast.  Comparison: CT head without contrast 06/11/2011.  Findings: Moderate generalized atrophy and diffuse white matter disease is similar to the prior exams.  Ventricles are proportionate to the degree of atrophy.  Remote lacunar infarcts are evident within the thalami.  No acute cortical infarct, hemorrhage, mass lesion is present.  No significant extra-axial fluid collection is present.  A tiny fluid level is present in the left maxillary sinus.  No associated fractures evident.  The remaining paranasal sinuses and mastoid air cells are clear.  The osseous skull is intact.  No significant extracranial soft tissue injury is evident.  IMPRESSION:  1.  Stable atrophy and diffuse white matter disease. 2.  Tiny left maxillary fluid level without evidence for acute fracture.  This is likely related to sinus disease.   Original Report Authenticated By: Marin Roberts, M.D.   1. Dizziness   2. Fall, initial encounter   3. Hematemesis   4. HTN (hypertension)   5. COPD (chronic obstructive pulmonary disease)   6. Aortic stenosis   7. Coffee ground emesis     MDM  Patient is an 79 y/o M with HTN, HLD, COPD on oxygen therapy that he does not use, lives alone with aid 5 days per week - presenting to the ED with a fall associated with dizziness. EMTs noted that patient had emesis that appeared to be coffee ground in appearance.  Alert and oriented. GCS 15. No trauma noted to head. Dried coffee ground emesis in mouth noted upon exam. Cranial nerves III-XII grossly intact - negative facial drooping, negative speech difficulty. Strength 5+/5+ with resistance. Full ROM to upper and lower extremities. Lungs clear - old ecchymosis noted to lower anterior aspect of ribs bilaterally.  Heart normal.  CT head negative intracranial injury noted. Ribs negative acute or healing fractures noted. EKG negative ischemic changes noted - numerous artifacts, patient not  sitting still throughout capture. CMP BUN elevated (33) - azotemia noted. CBC mild leukocytosis with mild elevation in WBC (11.6). Troponin negative elevation. Fecal occult negative - Hgb and Hct normal, no drop noted. UA noted hyaline casts - normal variant - with large Hgb, ketones, protein, and trace of leukocytes - patient has an enlarged prostate and in-and-out cath used, most likely Hgb from event of catheter placement - still need to rule out bleed. Negative orthostatics.  Spoke with daughter, Glade Stanford ([phone]), concerned because she has noticed a change in her father - reported that he has become more confused over the past couple of weeks and that he has been having issues with memory - forgetting a lot. Reported that he has fallen at least once every single day over the past 3 days. Reported that she is concerned since he lives alone, stated that he does have an aide that comes to the house from 9:00AM-12:00PM.  Doubt stroke. Patient to be admitted to Internal Medicine for possible GI bleed. To be admitted for dizziness, weakness, continuous falls, and confusion. Discussed case with Dr. Leodis Binet from [facility] - patient to be admitted to Telemetry for observation.              Raymon Mutton, PA-C 01/01/13 1723  Raymon Mutton, PA-C 01/01/13 [id number]

## 2012-12-31 NOTE — ED Provider Notes (Signed)
Medical screening examination/treatment/procedure(s) were conducted as a shared visit with non-physician practitioner(s) and myself.  I personally evaluated the patient during the encounter  79 yo male with unwitnessed fall and dizziness. Possible syncope.  Also some confusion.  Alert on my exam, no distress, normal respirations, black appearing coating in oropharynx felt to be sequela of coffee ground emesis.  Plan for lab workup, evaluation for GI bleed, and likely admisison.    Admitted to Medicine.  Candyce Churn, MD 01/01/13 1410

## 2013-01-01 DIAGNOSIS — R55 Syncope and collapse: Secondary | ICD-10-CM

## 2013-01-01 DIAGNOSIS — J449 Chronic obstructive pulmonary disease, unspecified: Secondary | ICD-10-CM

## 2013-01-01 DIAGNOSIS — K920 Hematemesis: Secondary | ICD-10-CM

## 2013-01-01 DIAGNOSIS — J4489 Other specified chronic obstructive pulmonary disease: Secondary | ICD-10-CM

## 2013-01-01 DIAGNOSIS — I359 Nonrheumatic aortic valve disorder, unspecified: Secondary | ICD-10-CM

## 2013-01-01 DIAGNOSIS — R42 Dizziness and giddiness: Secondary | ICD-10-CM

## 2013-01-01 LAB — CBC
HCT: 41.8 % (ref 39.0–52.0)
Hemoglobin: 13.9 g/dL (ref 13.0–17.0)
MCV: 86.2 fL (ref 78.0–100.0)
RDW: 14.8 % (ref 11.5–15.5)
WBC: 10.5 10*3/uL (ref 4.0–10.5)

## 2013-01-01 LAB — TSH: TSH: 1.015 u[IU]/mL (ref 0.350–4.500)

## 2013-01-01 LAB — BASIC METABOLIC PANEL
BUN: 31 mg/dL — ABNORMAL HIGH (ref 6–23)
CO2: 21 mEq/L (ref 19–32)
Chloride: 106 mEq/L (ref 96–112)
Creatinine, Ser: 0.78 mg/dL (ref 0.50–1.35)

## 2013-01-01 MED ORDER — TAMSULOSIN HCL 0.4 MG PO CAPS
0.4000 mg | ORAL_CAPSULE | Freq: Every day | ORAL | Status: DC
  Administered 2013-01-01: 0.4 mg via ORAL
  Filled 2013-01-01 (×3): qty 1

## 2013-01-01 NOTE — Evaluation (Signed)
Physical Therapy Evaluation Patient Details Name: Jim Contreras MRN: [id number] DOB: 09-29-1923 Today's Date: 01/01/2013 Time: 0930-1010 PT Time Calculation (min): 40 min  PT Assessment / Plan / Recommendation History of Present Illness  Pt is an 79 y/o male admitted for observation after syncope, dizziness and falls  Clinical Impression  The patient presents with dizziness, syncope, and multiple falls. At time of PT evaluation, pt required max assist to maintain balance while taking 2 steps forward and backwards with the RW. Pt complains of increased dizziness and inability to maintain sitting balance at EOB without mod assist. Son-in-law present and assists with history - he states that pt was independent at home prior to admission. Therapist performed Hallpike-Dix with reported positive response on L and no nystagmus noted.Epley's Maneuver was then performed, with pt reported increase in symptoms after. Symptoms subside in <30 seconds. This patient would benefit from continued skilled PT interventions to address functional limitations, improve safety and independence with functional mobility, and return to PLOF.    PT Assessment  Patient needs continued PT services    Follow Up Recommendations  SNF    Does the patient have the potential to tolerate intense rehabilitation      Barriers to Discharge Other (comment) Pt lives alone, and pt/family state that pt is inconsistent with use of home O2 which may be contributing to dizzy/lightheaded feeling, and refuses use of walker, and chooses cane instead, which also may be contributing to falls    Equipment Recommendations       Recommendations for Other Services     Frequency Min 3X/week    Precautions / Restrictions Precautions Precautions: Fall Restrictions Weight Bearing Restrictions: No   Pertinent Vitals/Pain Pt reports no pain throughout session, and O2 saturation decreased to 88% during MMT at EOB with 2L/min supplemental O2  donned.       Mobility  Bed Mobility Bed Mobility: Supine to Sit;Sit to Supine;Scooting to HOB Supine to Sit: 3: Mod assist Sit to Supine: 4: Min assist Scooting to [facility]: 4: Min assist Details for Bed Mobility Assistance: Pt required VC's for technique and sequencing during bed mobility, and required min assist to perform tasks (mod assist for supine>sit) Transfers Transfers: Sit to Stand;Stand to Sit Sit to Stand: 3: Mod assist;With upper extremity assist;From bed Stand to Sit: 4: Min assist;With upper extremity assist;To bed Details for Transfer Assistance: Pt required VC's for sequencing and technique to perform transfers. Pt was cued especially for hand placement and improved posture for safety and balance. Ambulation/Gait Ambulation/Gait Assistance: 3: Mod assist;2: Max Environmental consultant (Feet): 3 Feet Assistive device: Rolling walker Ambulation/Gait Assistance Details: Pt required mod to max assist to take 2 steps forward and backwards. Pt was cued for sequencing and safety awareness with the RW, and demonstrated difficulty with maintaining balance. Gait Pattern: Step-to pattern Stairs: No Wheelchair Mobility Wheelchair Mobility: No    Exercises     PT Diagnosis: Difficulty walking;Generalized weakness  PT Problem List: Decreased strength;Decreased activity tolerance;Decreased balance;Decreased mobility;Decreased knowledge of use of DME;Decreased safety awareness PT Treatment Interventions: DME instruction;Gait training;Functional mobility training;Therapeutic activities;Therapeutic exercise;Balance training;Patient/family education     PT Goals(Current goals can be found in the care plan section) Acute Rehab PT Goals Patient Stated Goal: To return to PLOF PT Goal Formulation: With patient/family Time For Goal Achievement: 01/08/13 Potential to Achieve Goals: Good  Visit Information  Last PT Received On: 01/01/13 Assistance Needed: +1 History of Present  Illness: Pt is an 79 y/o male admitted  for observation after syncope, dizziness and falls       Prior Functioning  Home Living Family/patient expects to be discharged to:: Skilled nursing facility Living Arrangements: Alone Prior Function Level of Independence: Independent with assistive device(s) Communication Communication: No difficulties Dominant Hand: Right    Cognition  Cognition Arousal/Alertness: Awake/alert Behavior During Therapy: WFL for tasks assessed/performed Overall Cognitive Status: Within Functional Limits for tasks assessed    Extremity/Trunk Assessment Upper Extremity Assessment Upper Extremity Assessment: Defer to OT evaluation Lower Extremity Assessment Lower Extremity Assessment: Generalized weakness   Balance Balance Balance Assessed: Yes Static Sitting Balance Static Sitting - Balance Support: Bilateral upper extremity supported;Feet supported Static Sitting - Level of Assistance: 3: Mod assist Static Sitting - Comment/# of Minutes: 5 Dynamic Sitting Balance Dynamic Sitting - Balance Support: Feet supported;Bilateral upper extremity supported Dynamic Sitting Balance - Compensations: Lateral lean, posterior lean Static Standing Balance Static Standing - Balance Support: Bilateral upper extremity supported Static Standing - Level of Assistance: 3: Mod assist Static Standing - Comment/# of Minutes: 3 Dynamic Standing Balance Dynamic Standing - Balance Support: Bilateral upper extremity supported Dynamic Standing - Level of Assistance: 3: Mod assist;2: Max assist  End of Session PT - End of Session Equipment Utilized During Treatment: Gait belt Activity Tolerance: Patient tolerated treatment well;Other (comment) (Pt limited by dizziness) Patient left: in bed;with call bell/phone within reach;with family/visitor present Nurse Communication: Mobility status  GP Functional Assessment Tool Used: Clinical Judgement Functional Limitation: Mobility: Walking  and moving around;Changing and maintaining body position;Carrying, moving and handling objects Mobility: Walking and Moving Around Current Status [phone]): At least 80 percent but less than 100 percent impaired, limited or restricted Mobility: Walking and Moving Around Goal Status [phone]): At least 80 percent but less than 100 percent impaired, limited or restricted Changing and Maintaining Body Position Current Status (J4782): At least 60 percent but less than 80 percent impaired, limited or restricted Changing and Maintaining Body Position Goal Status (N5621): At least 60 percent but less than 80 percent impaired, limited or restricted Carrying, Moving and Handling Objects Current Status [phone]): At least 80 percent but less than 100 percent impaired, limited or restricted Carrying, Moving and Handling Objects Goal Status [phone]): At least 80 percent but less than 100 percent impaired, limited or restricted   Ruthann Cancer 01/01/2013, 12:45 PM  Ruthann Cancer, PT Acute Rehabilitations Services

## 2013-01-01 NOTE — Progress Notes (Signed)
  Echocardiogram 2D Echocardiogram has been performed.  Cathie Beams 01/01/2013, 11:38 AM

## 2013-01-01 NOTE — Progress Notes (Signed)
PATIENT DETAILS Name: Jim Contreras Age: 79 y.o. Sex: male Date of Birth: [birth year] Admit Date: 12/31/2012 Admitting Physician [name], MD ZOX:[id number], Maeola Sarah, MD  Subjective: No complaints today-wants to go home. Not sure whether or not he syncopized or a mech fall.  Assessment/Plan: Principal Problem:   Fall vs syncope -monitor in tele -check echo -Vestibular PT/PT eval-recurrent falls recently -claims he had "vertigo" like symptoms and then just "went down"-not sure if he actually syncopized. -EKG on admission showed artifacts-reviewed Tele-suspect he is in sinus-but ?brief run of narrow complex tachycardia this am. Some brady episodes as well.  Active Problems: One episode of coffee ground emesis -this occurred on admission-none since -Hb stable -Advance diet, c/w PPI and monitor-no indication for further work up at present   Hx of COPD (chronic obstructive pulmonary disease) -stable with clear lungs -c/w prn albuterol  Hx of UIP/NSIP -outpatient follow up with Pulmonary  Hx of Pul HTN -suspect secondary pul HTN from COPD/UIP/NSIP -stable at present    Aortic stenosis -stable at present -reviewed outpatient cards note-thought to be mild-and would not be the etiology for syncope as well.    Hypertension -continue with Losartan and Amlodipine    BPH (benign prostatic hyperplasia) -c/w Flomax and Avodart  Disposition: Remain inpatient-home once work up complete  DVT Prophylaxis:  SCD's  Code Status: Full code   Family Communication None  Procedures:  None  CONSULTS:  None   MEDICATIONS: Scheduled Meds: . amLODipine  5 mg Oral Daily  . atorvastatin  10 mg Oral Daily  . dutasteride  0.5 mg Oral Daily  . losartan  50 mg Oral Daily  . pantoprazole  40 mg Oral Daily  . sodium chloride  3 mL Intravenous Q12H  . tamsulosin  0.4 mg Oral Daily   Continuous Infusions:  PRN Meds:.acetaminophen, acetaminophen, albuterol, alum & mag  hydroxide-simeth, ondansetron (ZOFRAN) IV, ondansetron  Antibiotics: Anti-infectives   None       PHYSICAL EXAM: Vital signs in last 24 hours: Filed Vitals:   01/01/13 0507 01/01/13 0924 01/01/13 0928 01/01/13 0935  BP: 156/65 168/69 166/68 176/69  Pulse: 59 61 75 74  Temp: 98.5 F (36.9 C)     TempSrc: Oral Oral Oral   Resp: 20 20 21 21   Height:      Weight:      SpO2: 90% 92% 93% 94%    Weight change:  Filed Weights   12/31/12 2349  Weight: 66.2 kg (145 lb 15.1 oz)   Body mass index is 21.94 kg/(m^2).   Gen Exam: Awake and alert with clear speech.   Neck: Supple, No JVD.   Chest: B/L Clear.   CVS: S1 S2 Regular, no murmurs.  Abdomen: soft, BS +, non tender, non distended.  Extremities: no edema, lower extremities warm to touch. Neurologic: Non Focal.   Skin: No Rash.   Wounds: N/A.    Intake/Output from previous day:  Intake/Output Summary (Last 24 hours) at 01/01/13 1014 Last data filed at 01/01/13 0520  Gross per 24 hour  Intake    123 ml  Output    150 ml  Net    -27 ml     LAB RESULTS: CBC  Recent Labs Lab 12/31/12 1819 01/01/13 0540  WBC 11.6* 10.5  HGB 15.8 13.9  HCT 47.1 41.8  PLT 208 168  MCV 85.9 86.2  MCH 28.8 28.7  MCHC 33.5 33.3  RDW 14.6 14.8  LYMPHSABS 0.6*  --   MONOABS 1.3*  --  EOSABS 0.0  --   BASOSABS 0.0  --     Chemistries   Recent Labs Lab 12/31/12 1819 01/01/13 0540  NA 140 139  K 3.8 3.8  CL 103 106  CO2 22 21  GLUCOSE 116* 89  BUN 33* 31*  CREATININE 0.84 0.78  CALCIUM 9.2 8.3*    CBG: No results found for this basename: GLUCAP,  in the last 168 hours  GFR Estimated Creatinine Clearance: 62.1 ml/min (by C-G formula based on Cr of 0.78).  Coagulation profile  Recent Labs Lab 12/31/12 2300 01/01/13 0540  INR 1.24 1.21    Cardiac Enzymes  Recent Labs Lab 01/01/13 0016 01/01/13 0540  TROPONINI <0.30 <0.30    No components found with this basename: POCBNP,  No results found for  this basename: DDIMER,  in the last 72 hours No results found for this basename: HGBA1C,  in the last 72 hours No results found for this basename: CHOL, HDL, LDLCALC, TRIG, CHOLHDL, LDLDIRECT,  in the last 72 hours No results found for this basename: TSH, T4TOTAL, FREET3, T3FREE, THYROIDAB,  in the last 72 hours No results found for this basename: VITAMINB12, FOLATE, FERRITIN, TIBC, IRON, RETICCTPCT,  in the last 72 hours No results found for this basename: LIPASE, AMYLASE,  in the last 72 hours  Urine Studies No results found for this basename: UACOL, UAPR, USPG, UPH, UTP, UGL, UKET, UBIL, UHGB, UNIT, UROB, ULEU, UEPI, UWBC, URBC, UBAC, CAST, CRYS, UCOM, BILUA,  in the last 72 hours  MICROBIOLOGY: No results found for this or any previous visit (from the past 240 hour(s)).  RADIOLOGY STUDIES/RESULTS: Dg Ribs Bilateral W/chest  12/31/2012   *RADIOLOGY REPORT*  Clinical Data: Fall.  Bruising and bilateral anterior ribs.  Pain.  BILATERAL RIBS AND CHEST - 4+ VIEW  Comparison: CT chest 07/11/1012.  Findings: The heart size is normal.  Emphysematous change in interstitial lung disease is again noted.  Dedicated imaging of the ribs demonstrates no acute or healing fractures.  The visualized thoracolumbar spine is unremarkable.  IMPRESSION:  1.  No acute abnormality or significant interval change. 2.  No acute or healing fractures. 3.  Emphysema and chronic interstitial lung disease is again noted.   Original Report Authenticated By: Marin Roberts, M.D.   Ct Head Wo Contrast  12/31/2012   *RADIOLOGY REPORT*  Clinical Data: Unwitnessed fall.  Multiple recent falls.  CT HEAD WITHOUT CONTRAST  Technique:  Contiguous axial images were obtained from the base of the skull through the vertex without contrast.  Comparison: CT head without contrast 06/11/2011.  Findings: Moderate generalized atrophy and diffuse white matter disease is similar to the prior exams.  Ventricles are proportionate to the degree  of atrophy.  Remote lacunar infarcts are evident within the thalami.  No acute cortical infarct, hemorrhage, mass lesion is present.  No significant extra-axial fluid collection is present.  A tiny fluid level is present in the left maxillary sinus.  No associated fractures evident.  The remaining paranasal sinuses and mastoid air cells are clear.  The osseous skull is intact.  No significant extracranial soft tissue injury is evident.  IMPRESSION:  1.  Stable atrophy and diffuse white matter disease. 2.  Tiny left maxillary fluid level without evidence for acute fracture.  This is likely related to sinus disease.   Original Report Authenticated By: Marin Roberts, M.D.    Jeoffrey Massed, MD  [facility] Pager:[phone]  If 7PM-7AM, please contact night-coverage [email or web address] Password [email or web address] 01/01/2013, 10:14  AM   LOS: 1 day

## 2013-01-01 NOTE — Clinical Social Work Psychosocial (Signed)
Clinical Social Work Department BRIEF PSYCHOSOCIAL ASSESSMENT 01/01/2013  Patient:  Jim Contreras, Jim Contreras     Account Number:  [id number]     Admit date:  12/31/2012  Clinical Social Worker:  Lavell Luster  Date/Time:  01/01/2013 03:46 PM  Referred by:  Physician  Date Referred:  01/01/2013 Referred for  SNF Placement   Other Referral:   Interview type:  Other - See comment Other interview type:   CSW spoke with patient and his daughter.    PSYCHOSOCIAL DATA Living Status:  ALONE Admitted from facility:   Level of care:   Primary support name:  Jim Contreras [phone] Primary support relationship to patient:  CHILD, ADULT Degree of support available:   Strong and good. Daughter is very supportive of patient. Patient also has a son named Jim Contreras and a supportive son in law named Jim Contreras.    CURRENT CONCERNS Current Concerns  Post-Acute Placement   Other Concerns:    SOCIAL WORK ASSESSMENT / PLAN CSW met with patient and patient''s daughter to discuss the skilled nursing facility placement process. CSW gave daughter copy of nursing facilities. Patient was alert and oriented and agreed to a SNF placement search. Patient stated that he currently lives alone but is agreeing to go to skilled nursing facility after discharge. Patient and patient's daughter did not express any preference in skilled nursing facilities. Patient and patients daughter expressed interest in [place] options only.   Assessment/plan status:  Psychosocial Support/Ongoing Assessment of Needs Other assessment/ plan:   Will complete FL2 and PASRR   Information/referral to community resources:   CSW gave patient SNF list.    PATIENTS/FAMILYS RESPONSE TO PLAN OF CARE: Patient agreed to going forward with SNF placement search. Daughter Jim Contreras was very appreciative of CSW visit and plans to help the patient decide on SNF placement.       Roddie Mc, [place], [place], [phone]

## 2013-01-01 NOTE — Care Management Note (Signed)
    Page 1 of 1   01/02/2013     12:25:48 PM   CARE MANAGEMENT NOTE 01/02/2013  Patient:  Jim Contreras, Jim Contreras   Account Number:  [id number]  Date Initiated:  01/01/2013  Documentation initiated by:  Letha Cape  Subjective/Objective Assessment:   dx syncope, dizziness, falls  admit as observation- lives alone,     Action/Plan:   pt eval   Anticipated DC Date:  01/02/2013   Anticipated DC Plan:  SKILLED NURSING FACILITY  In-house referral  Clinical Social Worker      DC Planning Services  CM consult      Choice offered to / List presented to:             Status of service:  Completed, signed off Medicare Important Message given?   (If response is "NO", the following Medicare IM given date fields will be blank) Date Medicare IM given:   Date Additional Medicare IM given:    Discharge Disposition:  SKILLED NURSING FACILITY  Per UR Regulation:  Reviewed for med. necessity/level of care/duration of stay  If discussed at Long Length of Stay Meetings, dates discussed:    Comments:  01/02/13 12:24 Letha Cape RN, BSN [phone] patient for dc to snf today, CSW following.  01/01/13 11:30 Letha Cape RN, BSN [phone] patient lives alone, await pt eval.  Patient has medication coverage and transportation at dc.  NCM will continue to follow for dc needs.

## 2013-01-01 NOTE — Progress Notes (Signed)
IZEA LIVOLSI [id number] Admitted to 4U98: 12/31/2012 2315 Attending Provider: [name], MD    Jim Contreras is a 79 y.o. male patient admitted from ED awake, alert  & orientated  X 3,  Full Code, VSS - Blood pressure 162/69, pulse 66, temperature 97.8 F (36.6 C), temperature source Oral, resp. rate 21, height 5' 8.4" (1.737 m), weight 66.2 kg (145 lb 15.1 oz), SpO2 95.00%., O2  2L nasal cannular, no c/o shortness of breath, no c/o chest pain, no distress noted. Tele # [id number] placed and pt is currently running:normal sinus rhythm.   IV site WDL:  antecubital left, condition patent and no redness with a transparent dsg that's clean dry and intact.  Allergies:   Allergies  Allergen Reactions  . Floxin (Ofloxacin) Other (See Comments)    Lost feeling in feet  . Penicillins Other (See Comments)    Ulcers in mouth  . Sulfa Antibiotics Other (See Comments)    Ulcers in mouth     Past Medical History  Diagnosis Date  . Hypertension   . Hypercholesteremia   . BPH (benign prostatic hyperplasia)   . ILD (interstitial lung disease)   . COPD, moderate   . Stroke     Thalamic    History:  obtained from chart review, patient is a poor historian.  Pt orientation to unit, room and routine. Information packet given to patient and safety video watched, no family present on admission to unit.  Admission INP armband ID verified with patient and in place. SR up x 2, fall risk assessment complete with Patient verbalizing understanding of risks associated with falls. Pt verbalizes an understanding of how to use the call bell and to call for help before getting out of bed.  Skin, clean-dry- intact bruises scattered through body anterior torso BUE and BLE, multiple phases of bruising. Bruises to anterior torso marked, large bruising to 1st and 2nd left toe sore to touch. Abrasions to rt elbow and left knee are clean and dry, OTA. Blanchable redness to sacrum barrier cream applied. No evidence of  skin break down noted on exam.  Will cont to monitor and assist as needed.  Julien Nordmann [facility], RN 01/01/2013 1:32 AM

## 2013-01-01 NOTE — Consult Note (Signed)
CARDIOLOGY CONSULT NOTE    Patient ID: Jim Contreras MRN: [id number] DOB/AGE: Sep 26, 1923 79 y.o.  Admit date: 12/31/2012  Primary Physician   [name], MD Primary Cardiologist   [place] Reason for Consultation   Possible syncope  HPI: Mr. Jim Contreras is a 79 y.o. y/o male w/ PMHx of mild AS, mild moderate COPD w/ ILD, pulmonary hypertension, HTN, hypercholesterolemia, and BPH , presented to the ED on 12/31/12 w/ an episode of dizziness and fall vs. syncope. Daughter was present in the room to provide some of the recent history. She says he has had three episodes of dizziness that resulted in falls in the last 3 days. According to the patient, his most recent episode, he was in the yard sitting in a chair and got up, felt dizzy for 20 seconds or so and fell to the ground. He denies LOC, however the daughter said he was found unconscious in the yard by his neighbors who then called EMS. He denies any lightheadedness on standing, rather his surrounding start to spin. He also denies any associated palpitations, chest pain, associated SOB, diaphoresis, fever, chills, nausea, vomiting, voiding of bladder or bowels, or tongue biting. No history of seizures in the past.  The daughter spoke w/ EMS and said that the patient had a black substance on his lips/mouth, suggesting possible hematemesis (stated in an earlier note). FOBT found to be -ve in the ED, and H/H WNL. Patient has followed with Dr. Teressa Lower in the past for mild AS and increased pulmonary pressure seen on a previous ECHO. No there cardiac issues noted at this time.   Past Medical History  Diagnosis Date  . Hypertension   . Hypercholesteremia   . BPH (benign prostatic hyperplasia)   . ILD (interstitial lung disease)   . COPD, moderate   . Stroke     Thalamic     Past Surgical History  Procedure Laterality Date  . Cataract extraction Bilateral   . Appendectomy    . Hernia repair      Allergies  Allergen  Reactions  . Floxin (Ofloxacin) Other (See Comments)    Lost feeling in feet  . Penicillins Other (See Comments)    Ulcers in mouth  . Sulfa Antibiotics Other (See Comments)    Ulcers in mouth    I have reviewed the patient's current medications  . amLODipine  5 mg Oral Daily  . atorvastatin  10 mg Oral Daily  . dutasteride  0.5 mg Oral Daily  . losartan  50 mg Oral Daily  . pantoprazole  40 mg Oral Daily  . sodium chloride  3 mL Intravenous Q12H  . tamsulosin  0.4 mg Oral Daily     acetaminophen, acetaminophen, albuterol, alum & mag hydroxide-simeth, ondansetron (ZOFRAN) IV, ondansetron  Prior to Admission medications   Medication Sig Start Date End Date Taking? Authorizing Provider  albuterol (PROVENTIL HFA;VENTOLIN HFA) 108 (90 BASE) MCG/ACT inhaler Inhale 2 puffs into the lungs every 6 (six) hours as needed for wheezing or shortness of breath. 03/23/12  Yes Roxine Caddy, MD  amLODipine (NORVASC) 5 MG tablet Take 5 mg by mouth daily.  04/10/12  Yes Historical Provider, MD  aspirin 81 MG tablet Take 81 mg by mouth daily.   Yes Historical Provider, MD  atorvastatin (LIPITOR) 10 MG tablet Take 10 mg by mouth daily.     Yes Historical Provider, MD  dutasteride (AVODART) 0.5 MG capsule Take 0.5 mg by mouth daily.  Yes Historical Provider, MD  losartan (COZAAR) 50 MG tablet Take 50 mg by mouth daily.     Yes Historical Provider, MD  Spacer/Aero Chamber Mouthpiece MISC 1 applicator by Does not apply route once. 03/23/12  Yes Roxine Caddy, MD  Tamsulosin HCl (FLOMAX) 0.4 MG CAPS Take 0.4 mg by mouth daily.   Yes Historical Provider, MD     History   Social History  . Marital Status: Widowed    Spouse Name: N/A    Number of Children: 2  . Years of Education: N/A   Occupational History  . retired     Social History Main Topics  . Smoking status: Former Smoker -- 1.00 packs/day for 40 years    Types: Cigarettes    Quit date: 06/15/1971  . Smokeless tobacco: Never Used  .  Alcohol Use: Yes     Comment: sparingly  . Drug Use: No  . Sexually Active: Not on file   Other Topics Concern  . Not on file   Social History Narrative  . No narrative on file    No family status information on file.   No family history on file.   ROS:   General: Denies fever, chills, diaphoresis, appetite change and fatigue.  HEENT: Denies change in vision, congestion, sore throat, rhinorrhea, trouble swallowing, neck pain or neck stiffness.   Respiratory: Positive for DOE after 50 feet or so (hx of COPD and ILD). Denies cough, chest tightness, and wheezing.   Cardiovascular: Denies chest pain, palpitations and leg swelling.  Gastrointestinal: Denies nausea, vomiting, abdominal pain, diarrhea, constipation, blood in stool and abdominal distention.  Endocrine: Denies sweats, polyuria, polydipsia. Musculoskeletal: Denies myalgias, back pain, joint swelling, arthralgias or gait problem.  Skin: Denies pallor, rash and wounds.  Neurological: Positive for dizziness on standing and turning. Found unconscious in yard, although patient denies LOC. Denies seizures, weakness, lightheadedness, numbness and headaches.  Hematological: Denies adenopathy, easy bruising, personal or family bleeding history.  Psychiatric/Behavioral: Patient has recent decline in memory and cognition (according to daughter), along with sleep disturbances. Patient has trouble staying asleep and takes an Excedrin PM to help with his sleep.  Physical Exam: Blood pressure 176/69, pulse 74, temperature 98.5 F (36.9 C), temperature source Oral, resp. rate 21, height 5' 8.4" (1.737 m), weight 145 lb 15.1 oz (66.2 kg), SpO2 94.00%.  General: Vital signs reviewed. Orthostatics negative. Patient is a well-developed and well-nourished, in no acute distress and cooperative with exam. Alert and oriented x3.  Head: Normocephalic and atraumatic. Mouth: No erythema, exudates, sores, or ulcerations. Moist mucus membranes. Eyes:  PERRL, EOMI, conjunctivae normal, No scleral icterus.  Neck: Supple, trachea midline, normal ROM, No JVD, masses, thyromegaly, or carotid bruit present.  Cardiovascular: RRR, S1 normal, S2 normal, no murmurs, gallops, or rubs. Pulmonary/Chest: Normal respiratory effort, CTAB, no wheezes, rales, or rhonchi. Abdominal: Soft. Non-tender, non-distended, bowel sounds are normal, no masses, organomegaly, or guarding present.  Musculoskeletal: No joint deformities, erythema, or stiffness, ROM full and no nontender. Extremities: No swelling or edema,  pulses symmetric and intact bilaterally. No cyanosis or clubbing. Neurological: A&O x3, Strength is normal and symmetric bilaterally, cranial nerve II-XII are grossly intact, no focal motor deficit, sensory intact to light touch bilaterally.  Skin: Warm, dry and intact. No rashes or erythema. Psychiatric: Normal mood and affect. speech and behavior is normal. Mild memory deficit on exam.  Labs:   Lab Results  Component Value Date   WBC 10.5 01/01/2013   HGB 13.9  01/01/2013   HCT 41.8 01/01/2013   MCV 86.2 01/01/2013   PLT 168 01/01/2013    Recent Labs  01/01/13 0540  INR 1.21    Recent Labs Lab 12/31/12 1819 01/01/13 0540  NA 140 139  K 3.8 3.8  CL 103 106  CO2 22 21  BUN 33* 31*  CREATININE 0.84 0.78  CALCIUM 9.2 8.3*  PROT 6.8  --   BILITOT 0.6  --   ALKPHOS 61  --   ALT 22  --   AST 60*  --   GLUCOSE 116* 89   Magnesium  Date Value Range Status  05/21/2007 2.2   Final    Recent Labs  01/01/13 0016 01/01/13 0540  TROPONINI <0.30 <0.30    Recent Labs  12/31/12 1834  TROPIPOC 0.00   No results found for this basename: probnp   No results found for this basename: CHOL, HDL, LDLCALC, TRIG   No results found for this basename: DDIMER   No results found for this basename: Lipase, amylase   TSH  Date/Time Value Range Status  01/01/2013  5:40 AM 1.015  0.350 - 4.500 uIU/mL Final   Echo: 04/03/2012 Study  Conclusions: - Left ventricle: The cavity size was normal. Wall thickness was normal. Doppler parameters are consistent with abnormal left ventricular relaxation (grade 1 diastolic dysfunction). - Aortic valve: AV is thckened, calcified with mildly restricted motion. Peak and mean gradients through the valve are 31 and 17 mm Hg consistent with mild AS. - Pulmonary arteries: PA peak pressure: 51mm Hg (S).  01/01/13 Study Conclusions: - Left ventricle: The cavity size was normal. Wall thickness was normal. Systolic function was normal. The estimated ejection fraction was in the range of 55% to 60%. Wall motion was normal; there were no regional wall motion abnormalities. Doppler parameters are consistent with abnormal left ventricular relaxation (grade 1 diastolic dysfunction). Aortic valve: Trileaflet; mildly calcified leaflets. Valve mobility was restricted. Doppler: Transvalvular velocity was within the normal range. There was no stenosis. Trivial regurgitation.  ECG: NSR @ 61 BPM, w/ PAC's.  Radiology:  Dg Ribs Bilateral W/chest  12/31/2012   *RADIOLOGY REPORT*  Clinical Data: Fall.  Bruising and bilateral anterior ribs.  Pain.  BILATERAL RIBS AND CHEST - 4+ VIEW  Comparison: CT chest 07/11/1012.  Findings: The heart size is normal.  Emphysematous change in interstitial lung disease is again noted.  Dedicated imaging of the ribs demonstrates no acute or healing fractures.  The visualized thoracolumbar spine is unremarkable.  IMPRESSION:  1.  No acute abnormality or significant interval change. 2.  No acute or healing fractures. 3.  Emphysema and chronic interstitial lung disease is again noted.   Original Report Authenticated By: Marin Roberts, M.D.   Ct Head Wo Contrast  12/31/2012   *RADIOLOGY REPORT*  Clinical Data: Unwitnessed fall.  Multiple recent falls.  CT HEAD WITHOUT CONTRAST  Technique:  Contiguous axial images were obtained from the base of the skull through the  vertex without contrast.  Comparison: CT head without contrast 06/11/2011.  Findings: Moderate generalized atrophy and diffuse white matter disease is similar to the prior exams.  Ventricles are proportionate to the degree of atrophy.  Remote lacunar infarcts are evident within the thalami.  No acute cortical infarct, hemorrhage, mass lesion is present.  No significant extra-axial fluid collection is present.  A tiny fluid level is present in the left maxillary sinus.  No associated fractures evident.  The remaining paranasal sinuses and mastoid air cells are  clear.  The osseous skull is intact.  No significant extracranial soft tissue injury is evident.  IMPRESSION:  1.  Stable atrophy and diffuse white matter disease. 2.  Tiny left maxillary fluid level without evidence for acute fracture.  This is likely related to sinus disease.   Original Report Authenticated By: Marin Roberts, M.D.    ASSESSMENT AND PLAN:    79 y.o. y/o male w/ PMHx of mild AS, mild moderate COPD w/ ILD, pulmonary hypertension, HTN, hypercholesterolemia, and BPH, presented to the ED on 12/31/12 w/ an episode of dizziness w/ syncope. Based on history, it seems that this is more likely a vestibular issue rather than a cardiac problem. The patient describes dizziness on standing and rotation when standing w/out palpitations and lightheadedness. ECHO performed today also revealed no stenosis w/ trivial regurgitation, suggesting that the aortic valve is not the cause of his recent syncope.  There is no other evidence that the patient's presentation is cardiac related in the absence of an arrhythmia, valvular abnormality or structural issue.  Signed: Lars Masson, MD 01/01/2013 12:01 PM Patient seen and examined. I agree with the assessment and plan as detailed above. See also my additional thoughts below.     Fall or Syncope     The patient's daughter gives the history that he was found unconscious outside. However there is no  evidence that the patient has had syncope on a cardiac basis. We know that he has good left ventricular function. His aortic valvular disease is only mild. He has normal sinus rhythm by EKG. We have not seen any evidence of significant bradycardia arrhythmias or tachycardia arrhythmias. In addition he has definite vertigo. When therapy was trying to stand him today, he had significant balance problems. There has been no evidence of orthostasis. At this point further cardiac workup is not needed. I've had a careful discussion directly with the patient's daughter about this. She understands.      Active Problems:   COPD (chronic obstructive pulmonary disease)    Aortic stenosis    Mild   Coffee ground emesis    Hypertension    Vertigo,     He has significant vertigo    BPH (benign prostatic hyperplasia)  Jim Rough, MD, [facility] 01/01/2013 2:17 PM

## 2013-01-01 NOTE — Progress Notes (Signed)
Rehab Admissions Coordinator Note:  Patient was screened by Trish Mage for appropriateness for an Inpatient Acute Rehab Consult. Noted PT recommending SNF.   At this time, we are recommending Skilled Nursing Facility.  Trish Mage 01/01/2013, 3:47 PM  I can be reached at [phone].

## 2013-01-02 DIAGNOSIS — N400 Enlarged prostate without lower urinary tract symptoms: Secondary | ICD-10-CM

## 2013-01-02 MED ORDER — PANTOPRAZOLE SODIUM 40 MG PO TBEC: 40.0000 mg | DELAYED_RELEASE_TABLET | Freq: Every day | ORAL | Status: DC

## 2013-01-02 MED ORDER — MENTHOL 3 MG MT LOZG
1.0000 | LOZENGE | OROMUCOSAL | Status: DC | PRN
  Administered 2013-01-02: 3 mg via ORAL
  Filled 2013-01-02: qty 9

## 2013-01-02 NOTE — Progress Notes (Addendum)
Georgina Quint to be D/C'd Skilled nursing facility per MD order.  Discussed with the patient and all questions fully answered.    Medication List         albuterol 108 (90 BASE) MCG/ACT inhaler  Commonly known as:  PROVENTIL HFA;VENTOLIN HFA  Inhale 2 puffs into the lungs every 6 (six) hours as needed for wheezing or shortness of breath.     amLODipine 5 MG tablet  Commonly known as:  NORVASC  Take 5 mg by mouth daily.     aspirin 81 MG tablet  Take 81 mg by mouth daily.     atorvastatin 10 MG tablet  Commonly known as:  LIPITOR  Take 10 mg by mouth daily.     dutasteride 0.5 MG capsule  Commonly known as:  AVODART  Take 0.5 mg by mouth daily.     losartan 50 MG tablet  Commonly known as:  COZAAR  Take 50 mg by mouth daily.     pantoprazole 40 MG tablet  Commonly known as:  PROTONIX  Take 1 tablet (40 mg total) by mouth daily.     Spacer/Aero Chamber Mouthpiece Misc  1 applicator by Does not apply route once.     tamsulosin 0.4 MG Caps  Commonly known as:  FLOMAX  Take 0.4 mg by mouth daily.        VVS, Skin clean, dry and intact without evidence of skin break down, no evidence of skin tears noted. IV catheter discontinued intact. Site without signs and symptoms of complications. Dressing and pressure applied.  An After Visit Summary was printed and given to the patient. Patient escorted via stretcher, and D/C to [facility] via [facility].  Kennyth Arnold D 01/02/2013 4:11 PM

## 2013-01-02 NOTE — Clinical Social Work Placement (Signed)
Clinical Social Work Department CLINICAL SOCIAL WORK PLACEMENT NOTE 01/02/2013  Patient:  Jim Contreras, Jim Contreras  Account Number:  [id number] Admit date:  12/31/2012  Clinical Social Worker:  Cherre Blanc, [place]  Date/time:  01/01/2013 02:00 PM  Clinical Social Work is seeking post-discharge placement for this patient at the following level of care:   SKILLED NURSING   (*CSW will update this form in Epic as items are completed)   01/01/2013  Patient/family provided with [facility] Department of Clinical Social Works list of facilities offering this level of care within the geographic area requested by the patient (or if unable, by the patients family).  01/01/2013  Patient/family informed of their freedom to choose among providers that offer the needed level of care, that participate in Medicare, Medicaid or managed care program needed by the patient, have an available bed and are willing to accept the patient.  01/01/2013  Patient/family informed of [facility] ownership interest in [facility], as well as of the fact that they are under no obligation to receive care at this facility.  PASARR submitted to EDS on 01/02/2013 PASARR number received from EDS on 01/02/2013  FL2 transmitted to all facilities in geographic area requested by pt/family on  01/01/2013 FL2 transmitted to all facilities within larger geographic area on   Patient informed that his/her managed care company has contracts with or will negotiate with  certain facilities, including the following:   [employer] - Carollee Herter CM     Patient/family informed of bed offers received:  01/02/2013 Patient chooses bed at [facility] AND [facility] Physician recommends and patient chooses bed at    Patient to be transferred to [facility] AND REHAB on  01/02/2013 Patient to be transferred to facility by Ambulance Jim Contreras)  The following physician request were entered in  Epic:   Additional Comments: 01/02/13 Per MD for discharge today for SNF. Daughter, patient, and nurse notified. They are pleased with discharge plan. No further CSW needs. CSW signing off Jim Contreras, [place], [address], [place], [place], [phone]

## 2013-01-02 NOTE — Progress Notes (Signed)
OT order received and noted to be an OT/PT vestibular order. PT is addressing vestibular issues. We (OT) do not have an OT eval and treat order that is separate thus will not eval. If OT eval is needed for SNF placement then we will need a new order.

## 2013-01-02 NOTE — Progress Notes (Signed)
Physical Therapy Treatment Patient Details Name: Jim Contreras MRN: [id number] DOB: Aug 31, 1923 Today's Date: 01/02/2013 Time: 7846-9629 PT Time Calculation (min): 27 min  PT Assessment / Plan / Recommendation  History of Present Illness Pt is an 79 y/o male admitted for observation after syncope, dizziness and falls   Clinical Impression Pt admitted with syncope and falls.  Appears that treatment for BPPV yesterday was effective.  Pt able to ambulate today with RW with continued need for mod assist but without dizzinessw and with incr distance.   Pt will benefit from skilled PT to increase their independence and safety with mobility to allow discharge to the venue listed below.        Follow Up Recommendations  SNF                 Equipment Recommendations  Other (comment) (TBA)        Frequency Min 3X/week   Progress towards PT Goals Progress towards PT goals: Progressing toward goals  Plan Current plan remains appropriate    Precautions / Restrictions Precautions Precautions: Fall Restrictions Weight Bearing Restrictions: No   Pertinent Vitals/Pain VSS, no pain    Mobility  Bed Mobility Bed Mobility: Supine to Sit;Sit to Supine;Scooting to [facility];Rolling Right;Right Sidelying to Sit;Left Sidelying to Sit Rolling Right: 3: Mod assist Right Sidelying to Sit: 3: Mod assist Left Sidelying to Sit: 3: Mod assist Supine to Sit: 3: Mod assist Sit to Supine: 4: Min assist Details for Bed Mobility Assistance: Pt required VC's for technique and sequencing during bed mobility, and required min assist to perform tasks (mod assist for supine>sit).  Pt tested for BPPV with Modified [place].  Negative testing today.  Also performed supine head roll with negative test.  Feelthat therapist yesterday treated pt effectively for BPPV.   Transfers Transfers: Sit to Stand;Stand to Sit Sit to Stand: 3: Mod assist;With upper extremity assist;From bed Stand to Sit: 4: Min assist;With upper  extremity assist;To bed Details for Transfer Assistance: Pt required VC's for sequencing and technique to perform transfers. Pt was cued especially for hand placement and improved posture for safety and balance.Pt initallly needed steadying assist upon initial stand due to posterior lean.   Ambulation/Gait Ambulation/Gait Assistance: 3: Mod assist Ambulation Distance (Feet): 20 Feet Assistive device: Rolling walker Ambulation/Gait Assistance Details: Pt able to ambulate wtih RW today.  Needed cues for sequencing steps.  Demonstrates a "Parkinson" like gait.  Neededmax cues to move right LE to take a big step almost every time.  Pt  would take a few decent steps and then shuffle to the point that PT would have to assist him to step into walker.  PT assisted to control the RW as well.  Needed max cuing for safety with RW.   Gait Pattern: Step-to pattern;Decreased step length - right;Decreased step length - left;Decreased hip/knee flexion - left;Decreased hip/knee flexion - right;Decreased stride length;Shuffle;Festinating;Trunk flexed Gait velocity: decreased Stairs: No Wheelchair Mobility Wheelchair Mobility: No    Exercises General Exercises - Lower Extremity Ankle Circles/Pumps: AROM;Both;15 reps;Seated Long Arc Quad: AROM;Both;15 reps;Seated Heel Slides: AROM;Both;15 reps;Seated Hip Flexion/Marching: AROM;Both;15 reps;Seated      PT Goals (current goals can now be found in the care plan section)    Visit Information  Last PT Received On: 01/02/13 Assistance Needed: +1 History of Present Illness: Pt is an 79 y/o male admitted for observation after syncope, dizziness and falls    Subjective Data  Subjective: "I feel better than yesterday.  Now I  am just lightheaded. "   Cognition  Cognition Arousal/Alertness: Awake/alert Behavior During Therapy: WFL for tasks assessed/performed Overall Cognitive Status: Within Functional Limits for tasks assessed    Balance  Static Sitting  Balance Static Sitting - Balance Support: Bilateral upper extremity supported;Feet supported Static Sitting - Level of Assistance: 3: Mod assist;4: Min assist;5: Stand by assistance Static Sitting - Comment/# of Minutes: Initially pt needed mod asssit to sit up upon initial sit  Once upfor 3 min, pt able to sit 6 min with stand by assist.   Dynamic Sitting Balance Dynamic Sitting - Balance Support: Feet supported;Bilateral upper extremity supported Dynamic Sitting Balance - Compensations: Lateral lean, posterior lean Static Standing Balance Static Standing - Balance Support: Bilateral upper extremity supported Static Standing - Level of Assistance: 4: Min assist;3: Mod assist Static Standing - Comment/# of Minutes: needed min-mod asssit with static stance as pt tends to lean posterior at times and cannot catch himself if he leans a certain amount posteriorly.   Dynamic Standing Balance Dynamic Standing - Level of Assistance: Not tested (comment)  End of Session PT - End of Session Equipment Utilized During Treatment: Gait belt Activity Tolerance: Patient tolerated treatment well Patient left: in chair;with call bell/phone within reach Nurse Communication: Mobility status        INGOLD,Denney Shein 01/02/2013, 12:01 PM [facility] Acute Rehabilitation [phone] (pager)

## 2013-01-02 NOTE — Discharge Summary (Signed)
PATIENT DETAILS Name: Jim Contreras Age: 79 y.o. Sex: male Date of Birth: [birth year] MRN: [id number]. Admit Date: 12/31/2012 Admitting Physician: [name], MD AOZ:[id number], Maeola Sarah, MD  Recommendations for Outpatient Follow-up:  1. Optimize her BP medication 2. No further episodes of hematemesis-if hematemesis recurs, we need GI consultation  3. Please monitor hematocrit and hemoglobin periodically.  PRIMARY DISCHARGE DIAGNOSIS:  Principal Problem:   Fall Active Problems:   COPD (chronic obstructive pulmonary disease)   Aortic stenosis   Coffee ground emesis   Hypertension   Vertigo, peripheral   BPH (benign prostatic hyperplasia)      PAST MEDICAL HISTORY: Past Medical History  Diagnosis Date  . Hypertension   . Hypercholesteremia   . BPH (benign prostatic hyperplasia)   . ILD (interstitial lung disease)   . COPD, moderate   . Stroke     Thalamic    DISCHARGE MEDICATIONS:   Medication List         albuterol 108 (90 BASE) MCG/ACT inhaler  Commonly known as:  PROVENTIL HFA;VENTOLIN HFA  Inhale 2 puffs into the lungs every 6 (six) hours as needed for wheezing or shortness of breath.     amLODipine 5 MG tablet  Commonly known as:  NORVASC  Take 5 mg by mouth daily.     aspirin 81 MG tablet  Take 81 mg by mouth daily.     atorvastatin 10 MG tablet  Commonly known as:  LIPITOR  Take 10 mg by mouth daily.     dutasteride 0.5 MG capsule  Commonly known as:  AVODART  Take 0.5 mg by mouth daily.     losartan 50 MG tablet  Commonly known as:  COZAAR  Take 50 mg by mouth daily.     pantoprazole 40 MG tablet  Commonly known as:  PROTONIX  Take 1 tablet (40 mg total) by mouth daily.     Spacer/Aero Chamber Mouthpiece Misc  1 applicator by Does not apply route once.     tamsulosin 0.4 MG Caps  Commonly known as:  FLOMAX  Take 0.4 mg by mouth daily.        ALLERGIES:   Allergies  Allergen Reactions  . Floxin (Ofloxacin) Other (See Comments)   Lost feeling in feet  . Penicillins Other (See Comments)    Ulcers in mouth  . Sulfa Antibiotics Other (See Comments)    Ulcers in mouth    BRIEF HPI:  See H&P, Labs, Consult and Test reports for all details in brief,Jim Contreras is a 79 y.o. male with Past medical history of hypertension and BPH and COPD presented by EMS with h/o fall. He has been having episodes of dizziness which he describes as things around him spinning and he falling down  CONSULTATIONS:   cardiology  PERTINENT RADIOLOGIC STUDIES: Dg Ribs Bilateral W/chest  12/31/2012   *RADIOLOGY REPORT*  Clinical Data: Fall.  Bruising and bilateral anterior ribs.  Pain.  BILATERAL RIBS AND CHEST - 4+ VIEW  Comparison: CT chest 07/11/1012.  Findings: The heart size is normal.  Emphysematous change in interstitial lung disease is again noted.  Dedicated imaging of the ribs demonstrates no acute or healing fractures.  The visualized thoracolumbar spine is unremarkable.  IMPRESSION:  1.  No acute abnormality or significant interval change. 2.  No acute or healing fractures. 3.  Emphysema and chronic interstitial lung disease is again noted.   Original Report Authenticated By: Marin Roberts, M.D.   Ct Head Wo Contrast  12/31/2012   *  RADIOLOGY REPORT*  Clinical Data: Unwitnessed fall.  Multiple recent falls.  CT HEAD WITHOUT CONTRAST  Technique:  Contiguous axial images were obtained from the base of the skull through the vertex without contrast.  Comparison: CT head without contrast 06/11/2011.  Findings: Moderate generalized atrophy and diffuse white matter disease is similar to the prior exams.  Ventricles are proportionate to the degree of atrophy.  Remote lacunar infarcts are evident within the thalami.  No acute cortical infarct, hemorrhage, mass lesion is present.  No significant extra-axial fluid collection is present.  A tiny fluid level is present in the left maxillary sinus.  No associated fractures evident.  The remaining  paranasal sinuses and mastoid air cells are clear.  The osseous skull is intact.  No significant extracranial soft tissue injury is evident.  IMPRESSION:  1.  Stable atrophy and diffuse white matter disease. 2.  Tiny left maxillary fluid level without evidence for acute fracture.  This is likely related to sinus disease.   Original Report Authenticated By: Marin Roberts, M.D.     PERTINENT LAB RESULTS: CBC:  Recent Labs  12/31/12 1819 01/01/13 0540  WBC 11.6* 10.5  HGB 15.8 13.9  HCT 47.1 41.8  PLT 208 168   CMET CMP     Component Value Date/Time   NA 139 01/01/2013 0540   K 3.8 01/01/2013 0540   CL 106 01/01/2013 0540   CO2 21 01/01/2013 0540   GLUCOSE 89 01/01/2013 0540   BUN 31* 01/01/2013 0540   CREATININE 0.78 01/01/2013 0540   CALCIUM 8.3* 01/01/2013 0540   PROT 6.8 12/31/2012 1819   ALBUMIN 3.4* 12/31/2012 1819   AST 60* 12/31/2012 1819   ALT 22 12/31/2012 1819   ALKPHOS 61 12/31/2012 1819   BILITOT 0.6 12/31/2012 1819   GFRNONAA 79* 01/01/2013 0540   GFRAA >90 01/01/2013 0540    GFR Estimated Creatinine Clearance: 62.1 ml/min (by C-G formula based on Cr of 0.78). No results found for this basename: LIPASE, AMYLASE,  in the last 72 hours  Recent Labs  01/01/13 0016 01/01/13 0540  TROPONINI <0.30 <0.30   No components found with this basename: POCBNP,  No results found for this basename: DDIMER,  in the last 72 hours No results found for this basename: HGBA1C,  in the last 72 hours No results found for this basename: CHOL, HDL, LDLCALC, TRIG, CHOLHDL, LDLDIRECT,  in the last 72 hours  Recent Labs  01/01/13 0540  TSH 1.015   No results found for this basename: VITAMINB12, FOLATE, FERRITIN, TIBC, IRON, RETICCTPCT,  in the last 72 hours Coags:  Recent Labs  12/31/12 2300 01/01/13 0540  INR 1.24 1.21   Microbiology: No results found for this or any previous visit (from the past 240 hour(s)).   BRIEF HOSPITAL COURSE:   Principal Problem: Fall vs syncope   - Patient was admitted, and monitored in telemetry, Echo was done- showed EF around 55-60% with a grade 1 diastolic dysfunction. Cardiology was consulted, they felt that, his presenting symptoms were not related to his heart, and felt no further investigation were warranted at this time. - PT services were consulted, patient was found to be very unsteady on his feet in part to need discharge to skilled nursing facility for further rehabilitation. CT of the head on admission was negative for acute abnormalities.  Active Problems: One episode of coffee ground emesis  -this occurred on admission-none since  -Hb stable  -Advanced diet, c/w PPI on discharge and monitor-no indication  for further work up at present. If it recurs, will need further workup.  Hx of COPD (chronic obstructive pulmonary disease)  -stable with clear lungs  -c/w prn albuterol  Hx of UIP/NSIP  -outpatient follow up with Pulmonary   Hx of Pul HTN  -suspect secondary pul HTN from COPD/UIP/NSIP  -stable at present   Aortic stenosis  -stable at present  -reviewed outpatient cards note-thought to be mild-and would not be the etiology for syncope as well.   Hypertension  -continue with Losartan and Amlodipine   BPH (benign prostatic hyperplasia)  -c/w Flomax and Avodart  TODAY-DAY OF DISCHARGE:  Subjective:   Jim Contreras today has no headache,no chest abdominal pain,no new weakness tingling or numbness, feels much better wants to go home today.   Objective:   Blood pressure 159/64, pulse 64, temperature 97.6 F (36.4 C), temperature source Oral, resp. rate 18, height 5' 8.4" (1.737 m), weight 66.2 kg (145 lb 15.1 oz), SpO2 92.00%.  Intake/Output Summary (Last 24 hours) at 01/02/13 1009 Last data filed at 01/02/13 0549  Gross per 24 hour  Intake      0 ml  Output    550 ml  Net   -550 ml   Filed Weights   12/31/12 2349  Weight: 66.2 kg (145 lb 15.1 oz)    Exam Awake Alert, Oriented *3, No new F.N  deficits, Normal affect [place].AT,PERRAL Supple Neck,No JVD, No cervical lymphadenopathy appriciated.  Symmetrical Chest wall movement, Good air movement bilaterally, CTAB RRR,No Gallops,Rubs or new Murmurs, No Parasternal Heave +ve B.Sounds, Abd Soft, Non tender, No organomegaly appriciated, No rebound -guarding or rigidity. No Cyanosis, Clubbing or edema, No new Rash or bruise  DISCHARGE CONDITION: Stable  DISPOSITION: SNF   DISCHARGE INSTRUCTIONS:    Activity:  As tolerated with Full fall precautions use walker/cane & assistance as needed  Diet recommendation: Heart Healthy diet       Discharge Orders   Future Orders Complete By Expires     Call MD for:  persistant dizziness or light-headedness  As directed     Diet - low sodium heart healthy  As directed     Increase activity slowly  As directed        Follow-up Information   Follow up with [name], MD. Schedule an appointment as soon as possible for a visit in 2 weeks.   Contact information:   [address] [place] [address] [phone]       Total Time spent on discharge equals 45 minutes.  SignedJeoffrey Massed 01/02/2013 10:09 AM

## 2013-01-03 NOTE — ED Provider Notes (Signed)
Medical screening examination/treatment/procedure(s) were conducted as a shared visit with non-physician practitioner(s) and myself.  I personally evaluated the patient during the encounter.   Please see my separate note.    Candyce Churn, MD 01/03/13 1048

## 2013-03-27 ENCOUNTER — Telehealth: Payer: Self-pay | Admitting: Critical Care Medicine

## 2013-03-27 DIAGNOSIS — J849 Interstitial pulmonary disease, unspecified: Secondary | ICD-10-CM

## 2013-03-27 NOTE — Telephone Encounter (Signed)
Spoke with pt's daughter.  When pt last seen Dr Frederico Hamman in 06/2012 she had mentioned doing a 1 yr repeat chest ct for f/u.  Does pt need this prior to appt with Dr Delford Field in 04/2013 ?  Also, pt was on Spiriva prior to being admitted to SNF but has not had this since 12/2012.  Pt states that this helped his breathing.  Is this something that pt should be back on?  Please advise

## 2013-03-27 NOTE — Telephone Encounter (Signed)
Yes repeat ct chest non contrast before appt Yes stay on spiriva

## 2013-03-28 NOTE — Telephone Encounter (Signed)
CT order placed. I LMTCB to advise the daughter of recs and to see if they need a spiriva RX. Carron Curie, CMA

## 2013-03-30 NOTE — Telephone Encounter (Signed)
Returning call.

## 2013-03-30 NOTE — Telephone Encounter (Signed)
I spoke with daughter and is aware. Pt does need rx. She is going to find out what pharmacy and call us back

## 2013-03-30 NOTE — Telephone Encounter (Signed)
LMTCBx2. Annielee Jemmott, CMA  

## 2013-04-02 MED ORDER — TIOTROPIUM BROMIDE MONOHYDRATE 18 MCG IN CAPS: 18.0000 ug | ORAL_CAPSULE | Freq: Every day | RESPIRATORY_TRACT | Status: AC

## 2013-04-02 NOTE — Telephone Encounter (Signed)
Crystal since you are out in [place] can you please print these? thanks

## 2013-04-02 NOTE — Telephone Encounter (Signed)
Calling to give [place] the fax # to [facility]  [phone].  This is where patient's rx's will need to be sent.

## 2013-04-02 NOTE — Telephone Encounter (Signed)
lmtcb for daughter 

## 2013-04-02 NOTE — Telephone Encounter (Signed)
Spiriva rx printed.  Will have PW sign.

## 2013-04-03 NOTE — Telephone Encounter (Signed)
Jim Contreras returned call.  Spoke with Jim Contreras, advised that Spiriva rx has bee nfaxed to [facility].  Jim Contreras okay with this and verbalized her understanding.  Annabell Sabal to please call if anything further needed.  Nothing further needed at this time; will sign off.

## 2013-04-03 NOTE — Telephone Encounter (Signed)
Spiriva rx was faxed to [facility] yesterday. Left msg with family member for linda tcb to inform her of this.

## 2013-04-19 ENCOUNTER — Other Ambulatory Visit: Payer: Self-pay

## 2013-04-23 ENCOUNTER — Encounter (HOSPITAL_COMMUNITY): Payer: Self-pay | Admitting: Emergency Medicine

## 2013-04-23 ENCOUNTER — Emergency Department (HOSPITAL_COMMUNITY): Payer: Medicare Other

## 2013-04-23 ENCOUNTER — Inpatient Hospital Stay (HOSPITAL_COMMUNITY)
Admission: EM | Admit: 2013-04-23 | Discharge: 2013-04-26 | DRG: 392 | Disposition: A | Discharge status: Nursing Home (Includes ICF) | Payer: Medicare Other | Attending: Internal Medicine | Admitting: Internal Medicine

## 2013-04-23 DIAGNOSIS — E7800 Pure hypercholesterolemia, unspecified: Secondary | ICD-10-CM | POA: Diagnosis present

## 2013-04-23 DIAGNOSIS — E785 Hyperlipidemia, unspecified: Secondary | ICD-10-CM | POA: Diagnosis present

## 2013-04-23 DIAGNOSIS — E860 Dehydration: Secondary | ICD-10-CM

## 2013-04-23 DIAGNOSIS — Z8673 Personal history of transient ischemic attack (TIA), and cerebral infarction without residual deficits: Secondary | ICD-10-CM

## 2013-04-23 DIAGNOSIS — Z7982 Long term (current) use of aspirin: Secondary | ICD-10-CM

## 2013-04-23 DIAGNOSIS — K5730 Diverticulosis of large intestine without perforation or abscess without bleeding: Secondary | ICD-10-CM | POA: Diagnosis present

## 2013-04-23 DIAGNOSIS — N400 Enlarged prostate without lower urinary tract symptoms: Secondary | ICD-10-CM | POA: Diagnosis present

## 2013-04-23 DIAGNOSIS — R197 Diarrhea, unspecified: Secondary | ICD-10-CM

## 2013-04-23 DIAGNOSIS — Z8614 Personal history of Methicillin resistant Staphylococcus aureus infection: Secondary | ICD-10-CM

## 2013-04-23 DIAGNOSIS — K5289 Other specified noninfective gastroenteritis and colitis: Principal | ICD-10-CM | POA: Diagnosis present

## 2013-04-23 DIAGNOSIS — J849 Interstitial pulmonary disease, unspecified: Secondary | ICD-10-CM | POA: Diagnosis present

## 2013-04-23 DIAGNOSIS — R4182 Altered mental status, unspecified: Secondary | ICD-10-CM

## 2013-04-23 DIAGNOSIS — E876 Hypokalemia: Secondary | ICD-10-CM

## 2013-04-23 DIAGNOSIS — K529 Noninfective gastroenteritis and colitis, unspecified: Secondary | ICD-10-CM

## 2013-04-23 DIAGNOSIS — J8410 Pulmonary fibrosis, unspecified: Secondary | ICD-10-CM | POA: Diagnosis present

## 2013-04-23 DIAGNOSIS — Z87891 Personal history of nicotine dependence: Secondary | ICD-10-CM

## 2013-04-23 DIAGNOSIS — I350 Nonrheumatic aortic (valve) stenosis: Secondary | ICD-10-CM | POA: Diagnosis present

## 2013-04-23 DIAGNOSIS — J9610 Chronic respiratory failure, unspecified whether with hypoxia or hypercapnia: Secondary | ICD-10-CM | POA: Diagnosis present

## 2013-04-23 DIAGNOSIS — J4489 Other specified chronic obstructive pulmonary disease: Secondary | ICD-10-CM | POA: Diagnosis present

## 2013-04-23 DIAGNOSIS — I359 Nonrheumatic aortic valve disorder, unspecified: Secondary | ICD-10-CM

## 2013-04-23 DIAGNOSIS — K52831 Collagenous colitis: Secondary | ICD-10-CM | POA: Diagnosis present

## 2013-04-23 DIAGNOSIS — I10 Essential (primary) hypertension: Secondary | ICD-10-CM | POA: Diagnosis present

## 2013-04-23 DIAGNOSIS — Z9981 Dependence on supplemental oxygen: Secondary | ICD-10-CM

## 2013-04-23 DIAGNOSIS — J449 Chronic obstructive pulmonary disease, unspecified: Secondary | ICD-10-CM | POA: Diagnosis present

## 2013-04-23 LAB — COMPREHENSIVE METABOLIC PANEL
BUN: 14 mg/dL (ref 6–23)
CO2: 24 mEq/L (ref 19–32)
Calcium: 8.9 mg/dL (ref 8.4–10.5)
Creatinine, Ser: 1.04 mg/dL (ref 0.50–1.35)
GFR calc Af Amer: 72 mL/min — ABNORMAL LOW (ref >90)
GFR calc non Af Amer: 62 mL/min — ABNORMAL LOW (ref >90)
Glucose, Bld: 97 mg/dL (ref 70–99)
Sodium: 141 mEq/L (ref 135–145)
Total Protein: 6.7 g/dL (ref 6.0–8.3)

## 2013-04-23 LAB — POCT I-STAT TROPONIN I: Troponin i, poc: 0 ng/mL (ref 0.00–0.08)

## 2013-04-23 LAB — URINALYSIS, ROUTINE W REFLEX MICROSCOPIC
Bilirubin Urine: NEGATIVE
Glucose, UA: NEGATIVE mg/dL
Hgb urine dipstick: NEGATIVE
Specific Gravity, Urine: 1.01 (ref 1.005–1.030)
Urobilinogen, UA: 0.2 mg/dL (ref 0.0–1.0)

## 2013-04-23 LAB — CBC WITH DIFFERENTIAL/PLATELET
Eosinophils Absolute: 0.7 10*3/uL (ref 0.0–0.7)
Eosinophils Relative: 10 % — ABNORMAL HIGH (ref 0–5)
HCT: 40.1 % (ref 39.0–52.0)
Lymphs Abs: 0.9 10*3/uL (ref 0.7–4.0)
MCH: 28.5 pg (ref 26.0–34.0)
MCV: 83.9 fL (ref 78.0–100.0)
Monocytes Absolute: 1.2 10*3/uL — ABNORMAL HIGH (ref 0.1–1.0)
Monocytes Relative: 18 % — ABNORMAL HIGH (ref 3–12)
Platelets: 264 10*3/uL (ref 150–400)
RBC: 4.78 MIL/uL (ref 4.22–5.81)

## 2013-04-23 LAB — LIPASE, BLOOD: Lipase: 10 U/L — ABNORMAL LOW (ref 11–59)

## 2013-04-23 MED ORDER — PANTOPRAZOLE SODIUM 40 MG PO TBEC
40.0000 mg | DELAYED_RELEASE_TABLET | Freq: Every day | ORAL | Status: DC
  Administered 2013-04-23 – 2013-04-25 (×3): 40 mg via ORAL
  Filled 2013-04-23 (×3): qty 1

## 2013-04-23 MED ORDER — AMLODIPINE BESYLATE 5 MG PO TABS
5.0000 mg | ORAL_TABLET | Freq: Every day | ORAL | Status: DC
  Administered 2013-04-23 – 2013-04-26 (×4): 5 mg via ORAL
  Filled 2013-04-23 (×4): qty 1

## 2013-04-23 MED ORDER — SODIUM CHLORIDE 0.9 % IJ SOLN
3.0000 mL | Freq: Two times a day (BID) | INTRAMUSCULAR | Status: DC
  Administered 2013-04-26: 10:00:00 3 mL via INTRAVENOUS

## 2013-04-23 MED ORDER — IOHEXOL 300 MG/ML  SOLN
25.0000 mL | Freq: Once | INTRAMUSCULAR | Status: AC | PRN
  Administered 2013-04-23: 25 mL via ORAL

## 2013-04-23 MED ORDER — POTASSIUM CHLORIDE CRYS ER 20 MEQ PO TBCR
40.0000 meq | EXTENDED_RELEASE_TABLET | Freq: Once | ORAL | Status: AC
  Administered 2013-04-23: 40 meq via ORAL
  Filled 2013-04-23: qty 2

## 2013-04-23 MED ORDER — ACETAMINOPHEN 325 MG PO TABS: 650.0000 mg | ORAL_TABLET | ORAL | Status: DC | PRN

## 2013-04-23 MED ORDER — POTASSIUM CHLORIDE 10 MEQ/100ML IV SOLN
10.0000 meq | INTRAVENOUS | Status: AC
  Administered 2013-04-23: 10 meq via INTRAVENOUS
  Filled 2013-04-23: qty 100

## 2013-04-23 MED ORDER — MAGNESIUM SULFATE 40 MG/ML IJ SOLN: 2.0000 g | Freq: Once | INTRAMUSCULAR | Status: DC

## 2013-04-23 MED ORDER — METRONIDAZOLE 500 MG PO TABS: 500.0000 mg | ORAL_TABLET | Freq: Once | ORAL | Status: DC

## 2013-04-23 MED ORDER — DUTASTERIDE 0.5 MG PO CAPS
0.5000 mg | ORAL_CAPSULE | Freq: Every day | ORAL | Status: DC
  Administered 2013-04-23 – 2013-04-26 (×4): 0.5 mg via ORAL
  Filled 2013-04-23 (×4): qty 1

## 2013-04-23 MED ORDER — TIOTROPIUM BROMIDE MONOHYDRATE 18 MCG IN CAPS
18.0000 ug | ORAL_CAPSULE | Freq: Every day | RESPIRATORY_TRACT | Status: DC
  Administered 2013-04-24 – 2013-04-26 (×3): 18 ug via RESPIRATORY_TRACT
  Filled 2013-04-23: qty 5

## 2013-04-23 MED ORDER — SIMVASTATIN 40 MG PO TABS
40.0000 mg | ORAL_TABLET | Freq: Every day | ORAL | Status: DC
  Filled 2013-04-23: qty 1

## 2013-04-23 MED ORDER — IOHEXOL 300 MG/ML  SOLN
100.0000 mL | Freq: Once | INTRAMUSCULAR | Status: AC | PRN
  Administered 2013-04-23: 100 mL via INTRAVENOUS

## 2013-04-23 MED ORDER — POTASSIUM CHLORIDE 20 MEQ/15ML (10%) PO LIQD
40.0000 meq | Freq: Four times a day (QID) | ORAL | Status: AC
  Administered 2013-04-23 (×2): 40 meq via ORAL
  Filled 2013-04-23 (×2): qty 30

## 2013-04-23 MED ORDER — LOSARTAN POTASSIUM 50 MG PO TABS
50.0000 mg | ORAL_TABLET | Freq: Every day | ORAL | Status: DC
  Administered 2013-04-23 – 2013-04-26 (×4): 50 mg via ORAL
  Filled 2013-04-23 (×4): qty 1

## 2013-04-23 MED ORDER — VANCOMYCIN 50 MG/ML ORAL SOLUTION
125.0000 mg | Freq: Four times a day (QID) | ORAL | Status: DC
  Administered 2013-04-23 – 2013-04-24 (×5): 125 mg via ORAL
  Filled 2013-04-23 (×9): qty 2.5

## 2013-04-23 MED ORDER — SODIUM CHLORIDE 0.9 % IV SOLN
1000.0000 mL | Freq: Once | INTRAVENOUS | Status: AC
  Administered 2013-04-23: 1000 mL via INTRAVENOUS

## 2013-04-23 MED ORDER — PRAVASTATIN SODIUM 40 MG PO TABS
40.0000 mg | ORAL_TABLET | Freq: Every day | ORAL | Status: DC
  Administered 2013-04-23 – 2013-04-25 (×3): 40 mg via ORAL
  Filled 2013-04-23 (×5): qty 1

## 2013-04-23 MED ORDER — POTASSIUM CHLORIDE IN NACL 20-0.9 MEQ/L-% IV SOLN
INTRAVENOUS | Status: DC
  Administered 2013-04-23: 10:00:00 via INTRAVENOUS
  Filled 2013-04-23 (×3): qty 1000

## 2013-04-23 MED ORDER — POTASSIUM CHLORIDE IN NACL 20-0.9 MEQ/L-% IV SOLN
INTRAVENOUS | Status: AC
  Filled 2013-04-23 (×2): qty 1000

## 2013-04-23 MED ORDER — ONDANSETRON HCL 4 MG PO TABS: 4.0000 mg | ORAL_TABLET | Freq: Four times a day (QID) | ORAL | Status: DC | PRN

## 2013-04-23 MED ORDER — TAMSULOSIN HCL 0.4 MG PO CAPS
0.4000 mg | ORAL_CAPSULE | Freq: Every day | ORAL | Status: DC
  Administered 2013-04-23 – 2013-04-26 (×4): 0.4 mg via ORAL
  Filled 2013-04-23 (×4): qty 1

## 2013-04-23 MED ORDER — SODIUM CHLORIDE 0.9 % IV SOLN
INTRAVENOUS | Status: DC
  Administered 2013-04-24: 06:00:00 via INTRAVENOUS
  Filled 2013-04-23 (×4): qty 1000

## 2013-04-23 MED ORDER — ONDANSETRON HCL 4 MG/2ML IJ SOLN: 4.0000 mg | Freq: Four times a day (QID) | INTRAMUSCULAR | Status: DC | PRN

## 2013-04-23 MED ORDER — BOOST / RESOURCE BREEZE PO LIQD
1.0000 | Freq: Two times a day (BID) | ORAL | Status: DC
  Administered 2013-04-25 – 2013-04-26 (×4): 1 via ORAL

## 2013-04-23 MED ORDER — ASPIRIN 81 MG PO TABS
81.0000 mg | ORAL_TABLET | Freq: Every day | ORAL | Status: DC
  Administered 2013-04-23 – 2013-04-26 (×4): 81 mg via ORAL
  Filled 2013-04-23 (×4): qty 1

## 2013-04-23 MED ORDER — METRONIDAZOLE IN NACL 5-0.79 MG/ML-% IV SOLN
500.0000 mg | Freq: Three times a day (TID) | INTRAVENOUS | Status: DC
  Administered 2013-04-23: 500 mg via INTRAVENOUS
  Filled 2013-04-23: qty 100

## 2013-04-23 MED ORDER — HEPARIN SODIUM (PORCINE) 5000 UNIT/ML IJ SOLN
5000.0000 [IU] | Freq: Three times a day (TID) | INTRAMUSCULAR | Status: DC
  Administered 2013-04-24 – 2013-04-26 (×6): 5000 [IU] via SUBCUTANEOUS
  Filled 2013-04-23 (×13): qty 1

## 2013-04-23 MED ORDER — SODIUM CHLORIDE 0.9 % IV SOLN: INTRAVENOUS | Status: DC

## 2013-04-23 NOTE — ED Notes (Signed)
Daughter present.  Daughter states that he had the dry heaves yesterday and the diarrhea has been going on since September.

## 2013-04-23 NOTE — ED Notes (Signed)
Up to the [facility] with much difficulty.  Daughter at the bedside

## 2013-04-23 NOTE — Progress Notes (Signed)
Pt is very confused and continues to talk about his big surgery in the morning. RN reoriented patient and sat with him for a while. Pt was trying to get out of bed earlier- stated that he had not seen a nurse since 7am yesterday. RN and tech reassured pt that we will be right here with him all night.  Will continue to monitor

## 2013-04-23 NOTE — Care Management Note (Unsigned)
    Page 1 of 1   04/23/2013     11:59:30 AM   CARE MANAGEMENT NOTE 04/23/2013  Patient:  Jim Contreras, Jim Contreras   Account Number:  [id number]  Date Initiated:  04/23/2013  Documentation initiated by:  Letha Cape  Subjective/Objective Assessment:   dx diarrhea  admit- lives alone, from ALF.     Action/Plan:   Anticipated DC Date:  04/24/2013   Anticipated DC Plan:  HOME W HOME HEALTH SERVICES      DC Planning Services  CM consult      Choice offered to / List presented to:             Status of service:  In process, will continue to follow Medicare Important Message given?   (If response is "NO", the following Medicare IM given date fields will be blank) Date Medicare IM given:   Date Additional Medicare IM given:    Discharge Disposition:    Per UR Regulation:  Reviewed for med. necessity/level of care/duration of stay  If discussed at Long Length of Stay Meetings, dates discussed:    Comments:  04/23/13 11:58 Letha Cape RN, BSN [phone] patient lives alone, from ALF.  NCM will continue to follow for dc needs.

## 2013-04-23 NOTE — ED Provider Notes (Signed)
CSN: [id number]     Arrival date & time 04/23/13  0202 History   First MD Initiated Contact with Patient 04/23/13 0207     Chief Complaint  Patient presents with  . Diarrhea   (Consider location/radiation/quality/duration/timing/severity/associated sxs/prior Treatment) HPI This patient is a very pleasant elderly man who resides at a local assisted care facility. He is brought to the emergency department by EMS because he has had persistent and worsening diarrhea for the past 6 weeks. The staff called the patient's daughter and to hold her that they felt that the color and smell the diarrhea had changed. The patient has become incontinent of diarrhea stools. He feels like the volume and frequency of diarrhea has worsened as well.  The patient received oral metronidazole and Cipro for her 6 days after he had a stool culture which was notable for a large number of white blood cells. This history is obtained from the patient's daughter. Review of the patient's current MAR does not sure that he is currently receiving denies all. Of note, the patient's symptoms began in mid September. This was just after he completed 2 courses of antibiotics, back to back. He was first treated for urinary tract infection and pneumonia. Daughter does not know which antibiotics he was treated with.  The patient says his by mouth intake has been good up until today. Her last 24 hours, he has had dry heaves and one episode of nonbilious, nonbloody emesis. He denies abdominal pain. He also denies any known fever. He denies history of similar symptoms and he does not have any known ill contacts at the facility where he resides. Previous abdominal surgeries include a remote appendectomy and hernia repair.  Past Medical History  Diagnosis Date  . Hypertension   . Hypercholesteremia   . BPH (benign prostatic hyperplasia)   . ILD (interstitial lung disease)   . COPD, moderate   . Stroke     Thalamic   Past Surgical History   Procedure Laterality Date  . Cataract extraction Bilateral   . Appendectomy    . Hernia repair     History reviewed. No pertinent family history. History  Substance Use Topics  . Smoking status: Former Smoker -- 1.00 packs/day for 40 years    Types: Cigarettes    Quit date: 06/15/1971  . Smokeless tobacco: Never Used  . Alcohol Use: Yes     Comment: sparingly    Review of Systems 10 point review of systems is obtained and is negative with the exception of symptoms noted above and chronic dyspnea on exertion.  Allergies  Floxin; Penicillins; and Sulfa antibiotics  Home Medications   Current Outpatient Rx  Name  Route  Sig  Dispense  Refill  . acetaminophen (TYLENOL) 325 MG tablet   Oral   Take 650 mg by mouth every 4 (four) hours as needed for mild pain.         Marland Kitchen amLODipine (NORVASC) 5 MG tablet   Oral   Take 5 mg by mouth daily.          Marland Kitchen aspirin 81 MG tablet   Oral   Take 81 mg by mouth daily.         Marland Kitchen dutasteride (AVODART) 0.5 MG capsule   Oral   Take 0.5 mg by mouth daily.         . lansoprazole (PREVACID) 30 MG capsule   Oral   Take 30 mg by mouth daily at 12 noon.         Marland Kitchen  loperamide (IMODIUM) 2 MG capsule   Oral   Take 2-4 mg by mouth as needed for diarrhea or loose stools.         Marland Kitchen losartan (COZAAR) 50 MG tablet   Oral   Take 50 mg by mouth daily.           . pravastatin (PRAVACHOL) 40 MG tablet   Oral   Take 40 mg by mouth daily.         . Tamsulosin HCl (FLOMAX) 0.4 MG CAPS   Oral   Take 0.4 mg by mouth daily.         Marland Kitchen tiotropium (SPIRIVA HANDIHALER) 18 MCG inhalation capsule   Inhalation   Place 1 capsule (18 mcg total) into inhaler and inhale daily.   30 capsule   6    BP 123/50  Pulse 62  Temp(Src) 98.1 F (36.7 C) (Oral)  Resp 20  Ht 5\' 7"  (1.702 m)  Wt 145 lb (65.772 kg)  BMI 22.71 kg/m2  SpO2 97% Physical Exam  Gen: well developed and well nourished appearing, very pleasant, appears to be in good  spirits Head: NCAT Eyes: PERL, EOMI Nose: no epistaixis or rhinorrhea Mouth/throat: mucosa is mildly dehydrated appearing and pink Neck: supple, no stridor Lungs: CTA B, no wheezing, rhonchi or rales, RR 24/min CV: RRR, no murmur, extremities well perfused Abd: soft, notender, mildly distended Back: mild kyphosis, otherwise normal to inspection Skin: warm and dry, poor skin turgor, skin tenting noted Ext: no edema Neuro: CN ii-xii grossly intact, no focal deficits Psyche; normal affect,  calm and cooperative.     ED Course  Procedures (including critical care time) Labs Review  Results for orders placed during the hospital encounter of 04/23/13 (from the past 24 hour(s))  CBC WITH DIFFERENTIAL     Status: Abnormal   Collection Time    04/23/13  2:40 AM      Result Value Range   WBC 6.7  4.0 - 10.5 K/uL   RBC 4.78  4.22 - 5.81 MIL/uL   Hemoglobin 13.6  13.0 - 17.0 g/dL   HCT 78.2  95.6 - 21.3 %   MCV 83.9  78.0 - 100.0 fL   MCH 28.5  26.0 - 34.0 pg   MCHC 33.9  30.0 - 36.0 g/dL   RDW 08.6  57.8 - 46.9 %   Platelets 264  150 - 400 K/uL   Neutrophils Relative % 59  43 - 77 %   Neutro Abs 4.0  1.7 - 7.7 K/uL   Lymphocytes Relative 13  12 - 46 %   Lymphs Abs 0.9  0.7 - 4.0 K/uL   Monocytes Relative 18 (*) 3 - 12 %   Monocytes Absolute 1.2 (*) 0.1 - 1.0 K/uL   Eosinophils Relative 10 (*) 0 - 5 %   Eosinophils Absolute 0.7  0.0 - 0.7 K/uL   Basophils Relative 0  0 - 1 %   Basophils Absolute 0.0  0.0 - 0.1 K/uL  POCT I-STAT TROPONIN I     Status: None   Collection Time    04/23/13  3:20 AM      Result Value Range   Troponin i, poc 0.00  0.00 - 0.08 ng/mL   Comment 3                CT Abdomen Pelvis W Contrast (Final result)  Result time: 04/23/13 05:50:18    Final result by Rad Results In Interface (04/23/13 05:50:18)  Narrative:   CLINICAL DATA: Diarrhea for 6 weeks.  EXAM: CT ABDOMEN AND PELVIS WITH CONTRAST  TECHNIQUE: Multidetector CT imaging of the  abdomen and pelvis was performed using the standard protocol following bolus administration of intravenous contrast.  CONTRAST: OMNIPAQUE IOHEXOL 300 MG/ML SOLN  COMPARISON: None.  FINDINGS: Honeycombing and mild fibrotic change are seen at the lung bases. A moderate hiatal hernia is seen; apparent decreased attenuation within the hernia is thought to reflect intraluminal contents.  A few small hypodensities within the right hepatic lobe, measuring up to 9 mm, are nonspecific but likely reflect small cysts. The spleen is unremarkable in appearance. Mild apparent wall thickening at the fundus of the gallbladder may reflect partial decompression; no definite mass is seen. The pancreas and adrenal glands are unremarkable.  Scattered bilateral renal cysts are seen, measuring up to 2.6 cm in size. Mild nonspecific perinephric stranding is noted bilaterally. There is no evidence of hydronephrosis. No renal or ureteral stones are seen.  No free fluid is identified. The small bowel is unremarkable in appearance. The stomach is within normal limits. No acute vascular abnormalities are seen.  The patient is status post appendectomy. Mild diverticulosis is noted along the distal descending and sigmoid colon. Note is made of unusual wall enhancement and thickening along the sigmoid colon, and to a lesser extent along the distal descending colon, concerning for an infectious process.  The bladder is mildly distended and grossly unremarkable in appearance. The prostate remains normal in size, with minimal calcification. A small to moderate right inguinal hernia is seen, containing only fat. No inguinal lymphadenopathy is seen.  No acute osseous abnormalities are identified. Mild degenerative change is noted along the lower lumbar spine. There is mild chronic loss of height at T12.  IMPRESSION: 1. Unusual wall enhancement and thickening along the sigmoid colon, and to a lesser  extent along the distal descending colon, concerning for an infectious process. An inflammatory process might have a similar appearance. 2. Mild diverticulosis along the distal descending and sigmoid colon. 3. Moderate hiatal hernia noted. 4. Likely small hepatic cysts and bilateral renal cysts. 5. Small to moderate right inguinal hernia, containing only fat.   Electronically Signed By: Roanna Raider M.D. On: 04/23/2013 05:50  EKG: NSR with prolonged QTc of 680 ms, normal axis, new flattening of the lateral t waves compared to most recent ekg. No acute ischemic changes.       MDM  Patient with history strongly suspicious for c. Diff colitis. CT scan confirms infectious appearing colitis. The patient is noted to be significantly hypokalemic. He is also noted to have new flattening of the T waves, mild and intermittent sinus bradycardia and new prolongation of the QT interval on EKG. We are treating hypokalemia with IV and PO KCL. We are treating presumed C.Diff infection with Flagyl PO.  The patient has recived IVF. He remains pain free.   Case discussed with Dr. Leodis Binet who has accepted the patient to the Tele Unit for admission.     Brandt Loosen, MD 04/23/13 [phone]

## 2013-04-23 NOTE — ED Notes (Signed)
Chemistry lab call notify nsg station that patient potassium level is 2.7, notified Dr. Rod Can of the level.

## 2013-04-23 NOTE — Consult Note (Signed)
Referring Provider: Dr. Patel Primary Care Physician:  Morrow, Aaron P, MD Primary Gastroenterologist:  Unassigned  Reason for Consultation:  Diarrhea  HPI: Jim Contreras is a 79 y.o. male with 8 weeks of watery diarrhea described as 2 times per day without associated abdominal pain, hematochezia, or melena. C. Diff negative. CT showing sigmoid colitis. Weakness for the last several days. No shortness of breath, chest pain, fevers, or chills. Was treated recently with Cipro for a UTI and Flagyl empirically for diarrhea. Other stool studies pending. Has never had a colonoscopy. Daughter at bedside.    Past Medical History  Diagnosis Date  . Hypertension   . Hypercholesteremia   . BPH (benign prostatic hyperplasia)   . ILD (interstitial lung disease)   . COPD, moderate   . Stroke     Thalamic    Past Surgical History  Procedure Laterality Date  . Cataract extraction Bilateral   . Appendectomy    . Hernia repair      Prior to Admission medications   Medication Sig Start Date End Date Taking? Authorizing Provider  acetaminophen (TYLENOL) 325 MG tablet Take 650 mg by mouth every 4 (four) hours as needed for mild pain.   Yes Historical Provider, MD  amLODipine (NORVASC) 5 MG tablet Take 5 mg by mouth daily.  04/10/12  Yes Historical Provider, MD  aspirin 81 MG tablet Take 81 mg by mouth daily.   Yes Historical Provider, MD  dutasteride (AVODART) 0.5 MG capsule Take 0.5 mg by mouth daily.   Yes Historical Provider, MD  lansoprazole (PREVACID) 30 MG capsule Take 30 mg by mouth daily at 12 noon.   Yes Historical Provider, MD  loperamide (IMODIUM) 2 MG capsule Take 2-4 mg by mouth as needed for diarrhea or loose stools.   Yes Historical Provider, MD  losartan (COZAAR) 50 MG tablet Take 50 mg by mouth daily.     Yes Historical Provider, MD  pravastatin (PRAVACHOL) 40 MG tablet Take 40 mg by mouth daily.   Yes Historical Provider, MD  Tamsulosin HCl (FLOMAX) 0.4 MG CAPS Take 0.4 mg by  mouth daily.   Yes Historical Provider, MD  tiotropium (SPIRIVA HANDIHALER) 18 MCG inhalation capsule Place 1 capsule (18 mcg total) into inhaler and inhale daily. 04/02/13 04/02/14 Yes Patrick E Wright, MD    Scheduled Meds: . amLODipine  5 mg Oral Daily  . aspirin  81 mg Oral Daily  . dutasteride  0.5 mg Oral Daily  . heparin  5,000 Units Subcutaneous Q8H  . losartan  50 mg Oral Daily  . magnesium sulfate  2 g Intravenous Once  . pantoprazole  40 mg Oral Daily  . pravastatin  40 mg Oral q1800  . sodium chloride  3 mL Intravenous Q12H  . tamsulosin  0.4 mg Oral Daily  . tiotropium  18 mcg Inhalation Daily  . vancomycin  125 mg Oral Q6H   Continuous Infusions: . sodium chloride    . 0.9 % NaCl with KCl 20 mEq / L 50 mL/hr at 04/23/13 1023  . 0.9 % NaCl with KCl 20 mEq / L    . sodium chloride 0.9 % 1,000 mL with potassium chloride 40 mEq infusion     PRN Meds:.acetaminophen, ondansetron (ZOFRAN) IV, ondansetron  Allergies as of 04/23/2013 - Review Complete 04/23/2013  Allergen Reaction Noted  . Floxin [ofloxacin] Other (See Comments) 06/11/2011  . Penicillins Other (See Comments) 06/11/2011  . Sulfa antibiotics Other (See Comments) 06/11/2011    History   reviewed. No pertinent family history.  History   Social History  . Marital Status: Widowed    Spouse Name: N/A    Number of Children: 2  . Years of Education: N/A   Occupational History  . retired     Social History Main Topics  . Smoking status: Former Smoker -- 1.00 packs/day for 40 years    Types: Cigarettes    Quit date: 06/15/1971  . Smokeless tobacco: Never Used  . Alcohol Use: Yes     Comment: sparingly  . Drug Use: No  . Sexual Activity: Not on file   Other Topics Concern  . Not on file   Social History Narrative  . No narrative on file    Review of Systems: All negative except as stated above in HPI.  Physical Exam: Vital signs: Filed Vitals:   04/23/13 0742  BP: 150/61  Pulse: 67  Temp:  97.6 F (36.4 C)  Resp: 24   Last BM Date: 04/23/13 General:   Elderly, frail, thin, awake, no acute distress Lungs:  Clear throughout to auscultation.   No wheezes, crackles, or rhonchi. No acute distress. Heart:  Regular rate and rhythm; no murmurs, clicks, rubs,  or gallops. Abdomen: soft, NT, ND, +BS  Rectal:  Deferred Ext: no edema  GI:  Lab Results:  Recent Labs  04/23/13 0240  WBC 6.7  HGB 13.6  HCT 40.1  PLT 264   BMET  Recent Labs  04/23/13 0240  NA 141  K 2.7*  CL 104  CO2 24  GLUCOSE 97  BUN 14  CREATININE 1.04  CALCIUM 8.9   LFT  Recent Labs  04/23/13 0240  PROT 6.7  ALBUMIN 2.9*  AST 19  ALT 8  ALKPHOS 63  BILITOT 0.3   PT/INR No results found for this basename: LABPROT, INR,  in the last 72 hours   Studies/Results: Ct Abdomen Pelvis W Contrast  04/23/2013   CLINICAL DATA:  Diarrhea for 6 weeks.  EXAM: CT ABDOMEN AND PELVIS WITH CONTRAST  TECHNIQUE: Multidetector CT imaging of the abdomen and pelvis was performed using the standard protocol following bolus administration of intravenous contrast.  CONTRAST:  100mL OMNIPAQUE IOHEXOL 300 MG/ML  SOLN  COMPARISON:  None.  FINDINGS: Honeycombing and mild fibrotic change are seen at the lung bases. A moderate hiatal hernia is seen; apparent decreased attenuation within the hernia is thought to reflect intraluminal contents.  A few small hypodensities within the right hepatic lobe, measuring up to 9 mm, are nonspecific but likely reflect small cysts. The spleen is unremarkable in appearance. Mild apparent wall thickening at the fundus of the gallbladder may reflect partial decompression; no definite mass is seen. The pancreas and adrenal glands are unremarkable.  Scattered bilateral renal cysts are seen, measuring up to 2.6 cm in size. Mild nonspecific perinephric stranding is noted bilaterally. There is no evidence of hydronephrosis. No renal or ureteral stones are seen.  No free fluid is identified. The  small bowel is unremarkable in appearance. The stomach is within normal limits. No acute vascular abnormalities are seen.  The patient is status post appendectomy. Mild diverticulosis is noted along the distal descending and sigmoid colon. Note is made of unusual wall enhancement and thickening along the sigmoid colon, and to a lesser extent along the distal descending colon, concerning for an infectious process.  The bladder is mildly distended and grossly unremarkable in appearance. The prostate remains normal in size, with minimal calcification. A small to moderate right   inguinal hernia is seen, containing only fat. No inguinal lymphadenopathy is seen.  No acute osseous abnormalities are identified. Mild degenerative change is noted along the lower lumbar spine. There is mild chronic loss of height at T12.  IMPRESSION: 1. Unusual wall enhancement and thickening along the sigmoid colon, and to a lesser extent along the distal descending colon, concerning for an infectious process. An inflammatory process might have a similar appearance. 2. Mild diverticulosis along the distal descending and sigmoid colon. 3. Moderate hiatal hernia noted. 4. Likely small hepatic cysts and bilateral renal cysts. 5. Small to moderate right inguinal hernia, containing only fat.   Electronically Signed   By: Jeffery  Chang M.D.   On: 04/23/2013 05:50    Impression/Plan: 79 yo with chronic watery diarrhea and no pain question inflammation vs ischemia vs infection. Malignancy also possible if an obstructive process is present. Needs flex sig as next step and will do with tap water enemas. C. Diff negative. F/U on other stool studies that are pending.    LOS: 0 days   Ruddy Swire C.  04/23/2013, 2:05 PM   

## 2013-04-23 NOTE — H&P (Signed)
[facility] History and Physical  Patient: Jim Contreras  YQM:[id number]  DOB: 12/07/1923  DOS: 04/23/2013 PCP: [name], MD  Chief Complaint: Diarrhea  HPI: Jim Contreras is a 79 y.o. male with Past medical history of hypertension, interstitial lung disease, CVA. The patient is coming from assisted living facility. The patient presents with complaint of diarrhea, he mentions a symptom has been ongoing on and off since September, but since last 6 days he has daily loose watery bowel movement, without any blood, without any abdominal pain. He also complains of fatigue and tiredness ongoing since last few days as well. There is no fever, chills, chest pain, shortness of breath, abdominal pain, burning urination, leg swelling. Recently he has been treated with oral ciprofloxacin for possible UTI and then there was another course of oral Flagyl for diarrhea.  Review of Systems: as mentioned in the history of present illness.  A Comprehensive review of the other systems is negative.  Past Medical History  Diagnosis Date  . Hypertension   . Hypercholesteremia   . BPH (benign prostatic hyperplasia)   . ILD (interstitial lung disease)   . COPD, moderate   . Stroke     Thalamic   Past Surgical History  Procedure Laterality Date  . Cataract extraction Bilateral   . Appendectomy    . Hernia repair     Social History:  reports that he quit smoking about 41 years ago. His smoking use included Cigarettes. He has a 40 pack-year smoking history. He has never used smokeless tobacco. He reports that he drinks alcohol. He reports that he does not use illicit drugs. Independent for most of his  ADL.  Allergies  Allergen Reactions  . Floxin [Ofloxacin] Other (See Comments)    Lost feeling in feet  . Penicillins Other (See Comments)    Ulcers in mouth  . Sulfa Antibiotics Other (See Comments)    Ulcers in mouth    History reviewed. No pertinent family history.  Prior to  Admission medications   Medication Sig Start Date End Date Taking? Authorizing Provider  acetaminophen (TYLENOL) 325 MG tablet Take 650 mg by mouth every 4 (four) hours as needed for mild pain.   Yes Historical Provider, MD  amLODipine (NORVASC) 5 MG tablet Take 5 mg by mouth daily.  04/10/12  Yes Historical Provider, MD  aspirin 81 MG tablet Take 81 mg by mouth daily.   Yes Historical Provider, MD  dutasteride (AVODART) 0.5 MG capsule Take 0.5 mg by mouth daily.   Yes Historical Provider, MD  lansoprazole (PREVACID) 30 MG capsule Take 30 mg by mouth daily at 12 noon.   Yes Historical Provider, MD  loperamide (IMODIUM) 2 MG capsule Take 2-4 mg by mouth as needed for diarrhea or loose stools.   Yes Historical Provider, MD  losartan (COZAAR) 50 MG tablet Take 50 mg by mouth daily.     Yes Historical Provider, MD  pravastatin (PRAVACHOL) 40 MG tablet Take 40 mg by mouth daily.   Yes Historical Provider, MD  Tamsulosin HCl (FLOMAX) 0.4 MG CAPS Take 0.4 mg by mouth daily.   Yes Historical Provider, MD  tiotropium (SPIRIVA HANDIHALER) 18 MCG inhalation capsule Place 1 capsule (18 mcg total) into inhaler and inhale daily. 04/02/13 04/02/14 Yes Storm Frisk, MD    Physical Exam: Filed Vitals:   04/23/13 0545 04/23/13 0600 04/23/13 0615 04/23/13 0643  BP: 118/57 122/61 133/59 112/59  Pulse: 59 61 68 88  Temp:  97.9 F (36.6 C)  TempSrc:    Oral  Resp:  14 16 28   Height:      Weight:      SpO2: 96% 94% 97% 100%    General: Alert, Awake and Oriented to Time, Place and Person. Appear in mild distress Eyes: PERRL ENT: Oral Mucosa clear dry. Neck: no JVD Cardiovascular: S1 and S2 Present, no Murmur, Peripheral Pulses Present Respiratory: Bilateral Air entry equal and Decreased, Clear to Auscultation,  no Crackles,no wheezes Abdomen: Bowel Sound Present, Soft and Non tender Skin: no Rash Extremities: no Pedal edema, no calf tenderness Neurologic: Grossly Unremarkable.  Labs on  Admission:  CBC:  Recent Labs Lab 04/23/13 0240  WBC 6.7  NEUTROABS 4.0  HGB 13.6  HCT 40.1  MCV 83.9  PLT 264    CMP     Component Value Date/Time   NA 141 04/23/2013 0240   K 2.7* 04/23/2013 0240   CL 104 04/23/2013 0240   CO2 24 04/23/2013 0240   GLUCOSE 97 04/23/2013 0240   BUN 14 04/23/2013 0240   CREATININE 1.04 04/23/2013 0240   CALCIUM 8.9 04/23/2013 0240   PROT 6.7 04/23/2013 0240   ALBUMIN 2.9* 04/23/2013 0240   AST 19 04/23/2013 0240   ALT 8 04/23/2013 0240   ALKPHOS 63 04/23/2013 0240   BILITOT 0.3 04/23/2013 0240   GFRNONAA 62* 04/23/2013 0240   GFRAA 72* 04/23/2013 0240     Recent Labs Lab 04/23/13 0240  LIPASE 10*   No results found for this basename: AMMONIA,  in the last 168 hours  No results found for this basename: CKTOTAL, CKMB, CKMBINDEX, TROPONINI,  in the last 168 hours BNP (last 3 results) No results found for this basename: PROBNP,  in the last 8760 hours  Radiological Exams on Admission: Ct Abdomen Pelvis W Contrast  04/23/2013   CLINICAL DATA:  Diarrhea for 6 weeks.  EXAM: CT ABDOMEN AND PELVIS WITH CONTRAST  TECHNIQUE: Multidetector CT imaging of the abdomen and pelvis was performed using the standard protocol following bolus administration of intravenous contrast.  CONTRAST:  OMNIPAQUE IOHEXOL 300 MG/ML  SOLN  COMPARISON:  None.  FINDINGS: Honeycombing and mild fibrotic change are seen at the lung bases. A moderate hiatal hernia is seen; apparent decreased attenuation within the hernia is thought to reflect intraluminal contents.  A few small hypodensities within the right hepatic lobe, measuring up to 9 mm, are nonspecific but likely reflect small cysts. The spleen is unremarkable in appearance. Mild apparent wall thickening at the fundus of the gallbladder may reflect partial decompression; no definite mass is seen. The pancreas and adrenal glands are unremarkable.  Scattered bilateral renal cysts are seen, measuring up to 2.6 cm  in size. Mild nonspecific perinephric stranding is noted bilaterally. There is no evidence of hydronephrosis. No renal or ureteral stones are seen.  No free fluid is identified. The small bowel is unremarkable in appearance. The stomach is within normal limits. No acute vascular abnormalities are seen.  The patient is status post appendectomy. Mild diverticulosis is noted along the distal descending and sigmoid colon. Note is made of unusual wall enhancement and thickening along the sigmoid colon, and to a lesser extent along the distal descending colon, concerning for an infectious process.  The bladder is mildly distended and grossly unremarkable in appearance. The prostate remains normal in size, with minimal calcification. A small to moderate right inguinal hernia is seen, containing only fat. No inguinal lymphadenopathy is seen.  No  acute osseous abnormalities are identified. Mild degenerative change is noted along the lower lumbar spine. There is mild chronic loss of height at T12.  IMPRESSION: 1. Unusual wall enhancement and thickening along the sigmoid colon, and to a lesser extent along the distal descending colon, concerning for an infectious process. An inflammatory process might have a similar appearance. 2. Mild diverticulosis along the distal descending and sigmoid colon. 3. Moderate hiatal hernia noted. 4. Likely small hepatic cysts and bilateral renal cysts. 5. Small to moderate right inguinal hernia, containing only fat.   Electronically Signed   By: Roanna Raider M.D.   On: 04/23/2013 05:50    EKG: Independently reviewed. normal EKG, normal sinus rhythm, unchanged from previous tracings, sinus bradycardia.  Assessment/Plan Principal Problem:   Diarrhea Active Problems:   Aortic stenosis   Hypokalemia   1. Diarrhea The patient is presenting with diarrhea that has been ongoing since last few weeks, and recently getting worse.  He has undergone a CT scan of the abdomen which ist  suggestive of sigmoid colitis. Recently since last few days he has been started on oral Flagyl. At present I would continue him on IV Flagyl. Admitting to the hospital for further workup. Obtain stool culture and stool studies. If the symptoms are not improving with the treatment and he may require GI consultation.  2. Hypertension Continue losartan amlodipine  3. Hypokalemia Likely secondary to GI losses. At present with both oral and IV potassium. Monitoring on telemetry due to sinus bradycardia  4. BPH Continue Flomax and Avodart   DVT Prophylaxis: subcutaneous Heparin Nutrition: Advance as tolerated  Code Status: Full  Disposition: Admitted to inpatient in telemetry unit.  Author: [name], MD [facility] Pager: [phone] 04/23/2013, 7:00 AM    If 7PM-7AM, please contact night-coverage [email or web address] Password [email or web address]

## 2013-04-23 NOTE — Progress Notes (Signed)
Patient ordered to receive tap water enemas for sigmoidoscopy in am. Pt will not hold liquid in rectum to complete enema. Paged Dr. Bosie Clos and received call back from Dr. Ewing Schlein who stated to call Dr. Bosie Clos after 0830 am for further instructions. Driggers, [employer]

## 2013-04-23 NOTE — ED Notes (Signed)
Patient transported to CT 

## 2013-04-23 NOTE — ED Notes (Signed)
Patient arrived via EMS from [facility].  Report from EMS that the patient has been having diarrhea for the last 6 weeks and today the staff reported that the color and smell has changed.  Has been seen by his PMD but tonight his daughter wanted him to be seen for the same.  She stated that he needed to receive some fluid.  Has received 6 days of Flagyl within the last 6 weeks (unknown when this was).

## 2013-04-23 NOTE — ED Notes (Signed)
Back to bed with assist times 2.  Unable to go to the bathroom

## 2013-04-23 NOTE — Progress Notes (Signed)
INITIAL NUTRITION ASSESSMENT  DOCUMENTATION CODES Per approved criteria  -Severe malnutrition in the context of chronic illness   INTERVENTION: Add Resource Breeze po BID, each supplement provides 250 kcal and 9 grams of protein. RD to continue to follow nutrition care plan.  NUTRITION DIAGNOSIS: Inadequate oral intake related to poor appetite and GI distress as evidenced by ongoing weight loss and pt report.   Goal: Advance diet as tolerated. Intake to meet >90% of estimated nutrition needs.  Monitor:  weight trends, lab trends, I/O's, PO intake, supplement tolerance, diet advancement  Reason for Assessment: Malnutrition Screening Tool  79 y.o. male  Admitting Dx: Diarrhea  ASSESSMENT: PMHx significant for HTN, interstitial lung disease, CVA. From ALF. Admitted with diarrhea (on and off x 2 months), but more frequent over the past 6 days. CT scan of abdomen suggests sigmoid colitis. C diff negative.  GI evaluated pt today and recommends flex sig. Currently on Clear Liquids.  Potassium is currently low at 2.7. Ordered for KCl.  Pt with 14% wt loss x 1 year; 12% of this weight loss has been within the past 6 months (this is significant for time frame.) Pt confirms poor appetite. He states that he just eats "bites" at meal times. Has a refrigerator "full of Ensure" however he told me that someone told him to stop drinking it 2/2 diarrhea. He states that he used to drink it in the past and didn't have diarrhea, so we discussed resuming it when able while here.   Nutrition Focused Physical Exam:  Subcutaneous Fat:  Orbital Region: moderate depletion Upper Arm Region: severe depletion Thoracic and Lumbar Region: n/a  Muscle:  Temple Region: moderate depletion Clavicle Bone Region: severe depletion Clavicle and Acromion Bone Region: severe depletion Scapular Bone Region: n/a Dorsal Hand: n/a Patellar Region: moderate depletion Anterior Thigh Region: moderate  depletion Posterior Calf Region: moderate depletion  Edema: none  Pt meets criteria for severe MALNUTRITION in the context of chronic illness as evidenced by severe muscle depletion and 12% wt loss x 6 months.   Height: Ht Readings from Last 1 Encounters:  04/23/13 5\' 7"  (1.702 m)    Weight: Wt Readings from Last 1 Encounters:  04/23/13 137 lb 5.6 oz (62.3 kg)    Ideal Body Weight: 148 lb  % Ideal Body Weight: 93%  Wt Readings from Last 10 Encounters:  04/23/13 137 lb 5.6 oz (62.3 kg)  12/31/12 145 lb 15.1 oz (66.2 kg)  11/10/12 154 lb (69.854 kg)  07/12/12 160 lb (72.576 kg)  05/08/12 160 lb 12.8 oz (72.938 kg)  05/05/12 161 lb (73.029 kg)  04/20/12 160 lb 6.4 oz (72.757 kg)  03/23/12 160 lb (72.576 kg)    Usual Body Weight: 160 lb  % Usual Body Weight: 86%  BMI:  Body mass index is 21.51 kg/(m^2). Normal weight.  Estimated Nutritional Needs: Kcal: 1600 - 1800 Protein: 74 - 86 g Fluid: 1.6 - 1.8 liters daily  Skin: intact  Diet Order: Clear Liquid  EDUCATION NEEDS: -No education needs identified at this time   Intake/Output Summary (Last 24 hours) at 04/23/13 1417 Last data filed at 04/23/13 1307  Gross per 24 hour  Intake    200 ml  Output      0 ml  Net    200 ml    Last BM: 11/10  Labs:   Recent Labs Lab 04/23/13 0240 04/23/13 0639  NA 141  --   K 2.7*  --   CL 104  --  CO2 24  --   BUN 14  --   CREATININE 1.04  --   CALCIUM 8.9  --   MG  --  1.9  GLUCOSE 97  --     CBG (last 3)  No results found for this basename: GLUCAP,  in the last 72 hours  Scheduled Meds: . amLODipine  5 mg Oral Daily  . aspirin  81 mg Oral Daily  . dutasteride  0.5 mg Oral Daily  . heparin  5,000 Units Subcutaneous Q8H  . losartan  50 mg Oral Daily  . magnesium sulfate  2 g Intravenous Once  . pantoprazole  40 mg Oral Daily  . pravastatin  40 mg Oral q1800  . sodium chloride  3 mL Intravenous Q12H  . tamsulosin  0.4 mg Oral Daily  . tiotropium   18 mcg Inhalation Daily  . vancomycin  125 mg Oral Q6H    Continuous Infusions: . sodium chloride    . 0.9 % NaCl with KCl 20 mEq / L 50 mL/hr at 04/23/13 1023  . 0.9 % NaCl with KCl 20 mEq / L    . sodium chloride 0.9 % 1,000 mL with potassium chloride 40 mEq infusion      Past Medical History  Diagnosis Date  . Hypertension   . Hypercholesteremia   . BPH (benign prostatic hyperplasia)   . ILD (interstitial lung disease)   . COPD, moderate   . Stroke     Thalamic    Past Surgical History  Procedure Laterality Date  . Cataract extraction Bilateral   . Appendectomy    . Hernia repair      Jarold Motto MS, RD, LDN Pager: [phone] After-hours pager: [phone]

## 2013-04-23 NOTE — Progress Notes (Signed)
[name] [id number] Admitted to 4U98: 04/23/2013 7:48 AM Attending Provider: [name], MD    Jim Contreras is a 79 y.o. male patient admitted from ED awake, alert  & orientated  X 3,  Prior, VSS - Blood pressure 150/61, pulse 67, temperature 97.6 F (36.4 C), temperature source Oral, resp. rate 24, height 5\' 7"  (1.702 m), weight 62.3 kg (137 lb 5.6 oz), SpO2 90.00%., O2  2 L nasal cannular, no c/o shortness of breath, no c/o chest pain, no distress noted. Tele # 12 placed.   IV sites WDL: with a transparent dsg that's clean dry and intact.  Allergies:   Allergies  Allergen Reactions  . Floxin [Ofloxacin] Other (See Comments)    Lost feeling in feet  . Penicillins Other (See Comments)    Ulcers in mouth  . Sulfa Antibiotics Other (See Comments)    Ulcers in mouth     Past Medical History  Diagnosis Date  . Hypertension   . Hypercholesteremia   . BPH (benign prostatic hyperplasia)   . ILD (interstitial lung disease)   . COPD, moderate   . Stroke     Thalamic    Report given to oncoming nurse. Will cont to monitor and assist as needed.  Elisha Ponder, RN 04/23/2013 7:48 AM

## 2013-04-24 ENCOUNTER — Encounter (HOSPITAL_COMMUNITY)
Admission: EM | Disposition: A | Discharge status: Nursing Home (Includes ICF) | Payer: Self-pay | Source: Home / Self Care | Attending: Internal Medicine

## 2013-04-24 DIAGNOSIS — K5289 Other specified noninfective gastroenteritis and colitis: Principal | ICD-10-CM

## 2013-04-24 HISTORY — PX: FLEXIBLE SIGMOIDOSCOPY: SHX5431

## 2013-04-24 LAB — CBC
Platelets: 243 10*3/uL (ref 150–400)
RDW: 15.4 % (ref 11.5–15.5)
WBC: 6.2 10*3/uL (ref 4.0–10.5)

## 2013-04-24 LAB — BASIC METABOLIC PANEL
Chloride: 109 mEq/L (ref 96–112)
GFR calc Af Amer: 89 mL/min — ABNORMAL LOW (ref >90)
Potassium: 3.4 mEq/L — ABNORMAL LOW (ref 3.5–5.1)
Sodium: 142 mEq/L (ref 135–145)

## 2013-04-24 LAB — GLUCOSE, CAPILLARY: Glucose-Capillary: 76 mg/dL (ref 70–99)

## 2013-04-24 LAB — OVA AND PARASITE EXAMINATION

## 2013-04-24 SURGERY — SIGMOIDOSCOPY, FLEXIBLE
Anesthesia: Moderate Sedation

## 2013-04-24 MED ORDER — FENTANYL CITRATE 0.05 MG/ML IJ SOLN
INTRAMUSCULAR | Status: AC
  Filled 2013-04-24: qty 2

## 2013-04-24 MED ORDER — DIPHENHYDRAMINE HCL 50 MG/ML IJ SOLN
INTRAMUSCULAR | Status: AC
  Filled 2013-04-24: qty 1

## 2013-04-24 MED ORDER — POLYETHYLENE GLYCOL 3350 17 GM/SCOOP PO POWD
1.0000 | Freq: Once | ORAL | Status: AC
  Administered 2013-04-24: 09:00:00 1 via ORAL
  Filled 2013-04-24: qty 255

## 2013-04-24 MED ORDER — POTASSIUM CHLORIDE IN NACL 20-0.9 MEQ/L-% IV SOLN
INTRAVENOUS | Status: AC
  Administered 2013-04-25: 04:00:00 via INTRAVENOUS
  Filled 2013-04-24 (×2): qty 1000

## 2013-04-24 MED ORDER — MIDAZOLAM HCL 10 MG/2ML IJ SOLN
INTRAMUSCULAR | Status: DC | PRN
  Administered 2013-04-24 (×3): 1 mg via INTRAVENOUS

## 2013-04-24 MED ORDER — POTASSIUM CHLORIDE CRYS ER 20 MEQ PO TBCR
40.0000 meq | EXTENDED_RELEASE_TABLET | Freq: Once | ORAL | Status: AC
  Administered 2013-04-24: 40 meq via ORAL
  Filled 2013-04-24: qty 2

## 2013-04-24 MED ORDER — MIDAZOLAM HCL 5 MG/ML IJ SOLN
INTRAMUSCULAR | Status: AC
  Filled 2013-04-24: qty 1

## 2013-04-24 MED ORDER — FENTANYL CITRATE 0.05 MG/ML IJ SOLN
INTRAMUSCULAR | Status: DC | PRN
  Administered 2013-04-24: 25 ug via INTRAVENOUS

## 2013-04-24 MED ORDER — LOPERAMIDE HCL 2 MG PO CAPS
2.0000 mg | ORAL_CAPSULE | ORAL | Status: DC | PRN
  Administered 2013-04-25: 10:00:00 2 mg via ORAL
  Filled 2013-04-24: qty 1

## 2013-04-24 NOTE — Progress Notes (Signed)
Patient very irritable this morning and refusing to take PO meds. He is upset because he states his "procedure was supposed to be at 6 am and he has been waiting since yesterday". Attemped to reorient patient to time and situation. Called Dr. Bosie Clos as directed by Dr. Ewing Schlein yesterday evening and he ordered patient to drink miralax until 10am. Will continue to monitor. Driggers, [employer]

## 2013-04-24 NOTE — Progress Notes (Signed)
Pt has BM this morning- it was green and slimy. Report has been given to day nurse who will follow up.

## 2013-04-24 NOTE — Progress Notes (Signed)
[facility]                                                                                Patient Demographics  Jim Contreras, is a 79 y.o. male, DOB - [birth year], ZOX:[id number]  Admit date - 04/23/2013   Admitting Physician [name], MD  Outpatient Primary MD for the patient is [name], MD  LOS - 1   Chief Complaint  Patient presents with  . Diarrhea        Assessment & Plan    1. Diarrhea ongoing for 2 months. C. difficile negative, stool PCR pending, CT scan suggestive of nonspecific sigmoid colon colitis. At his age suspicious for ischemic versus malignant changes, GI has been consulted and he is going for a sigmoidoscopy on 04/24/2013. Defer further workup to GI. His diarrhea is some better with supportive care, will add Imodium as needed.    2. Hypokalemia. Replace and monitor    3. Hypertension blood pressure stable on home regimen which includes Norvasc and ARB.    4. Dyslipidemia stable on home dose statin.       5. BPH no signs of your attention, is on Flomax and Avodart both will be continued.       Code Status: Full  Family Communication:   Disposition Plan: Home   Procedures scheduled for a sigmoidoscopy on 04/24/2013   Consults GI   Medications  Scheduled Meds: . amLODipine  5 mg Oral Daily  . aspirin  81 mg Oral Daily  . dutasteride  0.5 mg Oral Daily  . feeding supplement (RESOURCE BREEZE)  1 Container Oral BID BM  . heparin  5,000 Units Subcutaneous Q8H  . losartan  50 mg Oral Daily  . magnesium sulfate  2 g Intravenous Once  . pantoprazole  40 mg Oral Daily  . potassium chloride  40 mEq Oral Once  . pravastatin  40 mg Oral q1800  . sodium chloride  3 mL Intravenous Q12H  . tamsulosin  0.4 mg Oral Daily  . tiotropium  18 mcg Inhalation Daily  . vancomycin  125 mg Oral Q6H   Continuous Infusions: . 0.9 % NaCl with KCl 20 mEq / L    . sodium chloride 0.9 % 1,000 mL with potassium chloride 40 mEq infusion  75 mL/hr at 04/24/13 0621   PRN Meds:.acetaminophen, ondansetron (ZOFRAN) IV, ondansetron  DVT Prophylaxis  Heparin    Lab Results  Component Value Date   PLT 243 04/24/2013    Antibiotics     Anti-infectives   Start     Dose/Rate Route Frequency Ordered Stop   04/23/13 0715  vancomycin (VANCOCIN) 50 mg/mL oral solution 125 mg     125 mg Oral 4 times per day 04/23/13 0707     04/23/13 0615  metroNIDAZOLE (FLAGYL) IVPB 500 mg  Status:  Discontinued     500 mg 100 mL/hr over 60 Minutes Intravenous Every 8 hours 04/23/13 0604 04/23/13 0707   04/23/13 0600  metroNIDAZOLE (FLAGYL) tablet 500 mg  Status:  Discontinued     500 mg Oral  Once 04/23/13 0557 04/23/13 0604          Subjective:  [name] today has, No headache, No chest pain, No abdominal pain - No Nausea, No new weakness tingling or numbness, No Cough - SOB.  Diarrhea improved.  Objective:   Filed Vitals:   04/23/13 2052 04/24/13 0143 04/24/13 0448 04/24/13 0825  BP: 132/66  146/72   Pulse: 61  57   Temp: 98 F (36.7 C)  98.1 F (36.7 C)   TempSrc: Oral  Oral   Resp: 20  18   Height:      Weight:  62 kg (136 lb 11 oz)    SpO2: 95%  95% 95%    Wt Readings from Last 3 Encounters:  04/24/13 62 kg (136 lb 11 oz)  04/24/13 62 kg (136 lb 11 oz)  12/31/12 66.2 kg (145 lb 15.1 oz)     Intake/Output Summary (Last 24 hours) at 04/24/13 0904 Last data filed at 04/24/13 0848  Gross per 24 hour  Intake    200 ml  Output      0 ml  Net    200 ml    Exam Awake Alert, Oriented X 3, No new F.N deficits, Normal affect Kimberly.AT,PERRAL Supple Neck,No JVD, No cervical lymphadenopathy appriciated.  Symmetrical Chest wall movement, Good air movement bilaterally, CTAB RRR,No Gallops,Rubs or new Murmurs, No Parasternal Heave +ve B.Sounds, Abd Soft, Non tender, No organomegaly appriciated, No rebound - guarding or rigidity. No Cyanosis, Clubbing or edema, No new Rash or bruise      Data Review   Micro  Results Recent Results (from the past 240 hour(s))  CLOSTRIDIUM DIFFICILE BY PCR     Status: None   Collection Time    04/23/13  8:48 AM      Result Value Range Status   C difficile by pcr NEGATIVE  NEGATIVE Final    Radiology Reports Ct Abdomen Pelvis W Contrast  04/23/2013   CLINICAL DATA:  Diarrhea for 6 weeks.  EXAM: CT ABDOMEN AND PELVIS WITH CONTRAST  TECHNIQUE: Multidetector CT imaging of the abdomen and pelvis was performed using the standard protocol following bolus administration of intravenous contrast.  CONTRAST:  OMNIPAQUE IOHEXOL 300 MG/ML  SOLN  COMPARISON:  None.  FINDINGS: Honeycombing and mild fibrotic change are seen at the lung bases. A moderate hiatal hernia is seen; apparent decreased attenuation within the hernia is thought to reflect intraluminal contents.  A few small hypodensities within the right hepatic lobe, measuring up to 9 mm, are nonspecific but likely reflect small cysts. The spleen is unremarkable in appearance. Mild apparent wall thickening at the fundus of the gallbladder may reflect partial decompression; no definite mass is seen. The pancreas and adrenal glands are unremarkable.  Scattered bilateral renal cysts are seen, measuring up to 2.6 cm in size. Mild nonspecific perinephric stranding is noted bilaterally. There is no evidence of hydronephrosis. No renal or ureteral stones are seen.  No free fluid is identified. The small bowel is unremarkable in appearance. The stomach is within normal limits. No acute vascular abnormalities are seen.  The patient is status post appendectomy. Mild diverticulosis is noted along the distal descending and sigmoid colon. Note is made of unusual wall enhancement and thickening along the sigmoid colon, and to a lesser extent along the distal descending colon, concerning for an infectious process.  The bladder is mildly distended and grossly unremarkable in appearance. The prostate remains normal in size, with minimal  calcification. A small to moderate right inguinal hernia is seen, containing only fat. No inguinal lymphadenopathy is  seen.  No acute osseous abnormalities are identified. Mild degenerative change is noted along the lower lumbar spine. There is mild chronic loss of height at T12.  IMPRESSION: 1. Unusual wall enhancement and thickening along the sigmoid colon, and to a lesser extent along the distal descending colon, concerning for an infectious process. An inflammatory process might have a similar appearance. 2. Mild diverticulosis along the distal descending and sigmoid colon. 3. Moderate hiatal hernia noted. 4. Likely small hepatic cysts and bilateral renal cysts. 5. Small to moderate right inguinal hernia, containing only fat.   Electronically Signed   By: Roanna Raider M.D.   On: 04/23/2013 05:50    CBC  Recent Labs Lab 04/23/13 0240 04/24/13 0555  WBC 6.7 6.2  HGB 13.6 12.9*  HCT 40.1 39.1  PLT 264 243  MCV 83.9 85.7  MCH 28.5 28.3  MCHC 33.9 33.0  RDW 15.2 15.4  LYMPHSABS 0.9  --   MONOABS 1.2*  --   EOSABS 0.7  --   BASOSABS 0.0  --     Chemistries   Recent Labs Lab 04/23/13 0240 04/23/13 0639 04/23/13 1330 04/24/13 0555  NA 141  --   --  142  K 2.7*  --  3.5 3.4*  CL 104  --   --  109  CO2 24  --   --  22  GLUCOSE 97  --   --  85  BUN 14  --   --  7  CREATININE 1.04  --   --  0.83  CALCIUM 8.9  --   --  8.4  MG  --  1.9 1.8  --   AST 19  --   --   --   ALT 8  --   --   --   ALKPHOS 63  --   --   --   BILITOT 0.3  --   --   --    ------------------------------------------------------------------------------------------------------------------ estimated creatinine clearance is 55 ml/min (by C-G formula based on Cr of 0.83). ------------------------------------------------------------------------------------------------------------------ No results found for this basename: HGBA1C,  in the last 72  hours ------------------------------------------------------------------------------------------------------------------ No results found for this basename: CHOL, HDL, LDLCALC, TRIG, CHOLHDL, LDLDIRECT,  in the last 72 hours ------------------------------------------------------------------------------------------------------------------ No results found for this basename: TSH, T4TOTAL, FREET3, T3FREE, THYROIDAB,  in the last 72 hours ------------------------------------------------------------------------------------------------------------------ No results found for this basename: VITAMINB12, FOLATE, FERRITIN, TIBC, IRON, RETICCTPCT,  in the last 72 hours  Coagulation profile No results found for this basename: INR, PROTIME,  in the last 168 hours  No results found for this basename: DDIMER,  in the last 72 hours  Cardiac Enzymes No results found for this basename: CK, CKMB, TROPONINI, MYOGLOBIN,  in the last 168 hours ------------------------------------------------------------------------------------------------------------------ No components found with this basename: POCBNP,      Time Spent in minutes  35   SINGH,PRASHANT K M.D on 04/24/2013 at 9:04 AM  Between 7am to 7pm - Pager - [phone]  After 7pm go to [email or web address] - password [email or web address]  And look for the night coverage person covering for me after hours  [facility] Office  [phone]

## 2013-04-24 NOTE — H&P (View-Only) (Signed)
Referring Provider: Dr. Allena Katz Primary Care Physician:  [name], MD Primary Gastroenterologist:  Gentry Fitz  Reason for Consultation:  Diarrhea  HPI: Jim Contreras is a 79 y.o. male with 8 weeks of watery diarrhea described as 2 times per day without associated abdominal pain, hematochezia, or melena. C. Diff negative. CT showing sigmoid colitis. Weakness for the last several days. No shortness of breath, chest pain, fevers, or chills. Was treated recently with Cipro for a UTI and Flagyl empirically for diarrhea. Other stool studies pending. Has never had a colonoscopy. Daughter at bedside.    Past Medical History  Diagnosis Date  . Hypertension   . Hypercholesteremia   . BPH (benign prostatic hyperplasia)   . ILD (interstitial lung disease)   . COPD, moderate   . Stroke     Thalamic    Past Surgical History  Procedure Laterality Date  . Cataract extraction Bilateral   . Appendectomy    . Hernia repair      Prior to Admission medications   Medication Sig Start Date End Date Taking? Authorizing Provider  acetaminophen (TYLENOL) 325 MG tablet Take 650 mg by mouth every 4 (four) hours as needed for mild pain.   Yes Historical Provider, MD  amLODipine (NORVASC) 5 MG tablet Take 5 mg by mouth daily.  04/10/12  Yes Historical Provider, MD  aspirin 81 MG tablet Take 81 mg by mouth daily.   Yes Historical Provider, MD  dutasteride (AVODART) 0.5 MG capsule Take 0.5 mg by mouth daily.   Yes Historical Provider, MD  lansoprazole (PREVACID) 30 MG capsule Take 30 mg by mouth daily at 12 noon.   Yes Historical Provider, MD  loperamide (IMODIUM) 2 MG capsule Take 2-4 mg by mouth as needed for diarrhea or loose stools.   Yes Historical Provider, MD  losartan (COZAAR) 50 MG tablet Take 50 mg by mouth daily.     Yes Historical Provider, MD  pravastatin (PRAVACHOL) 40 MG tablet Take 40 mg by mouth daily.   Yes Historical Provider, MD  Tamsulosin HCl (FLOMAX) 0.4 MG CAPS Take 0.4 mg by  mouth daily.   Yes Historical Provider, MD  tiotropium (SPIRIVA HANDIHALER) 18 MCG inhalation capsule Place 1 capsule (18 mcg total) into inhaler and inhale daily. 04/02/13 04/02/14 Yes Storm Frisk, MD    Scheduled Meds: . amLODipine  5 mg Oral Daily  . aspirin  81 mg Oral Daily  . dutasteride  0.5 mg Oral Daily  . heparin  5,000 Units Subcutaneous Q8H  . losartan  50 mg Oral Daily  . magnesium sulfate  2 g Intravenous Once  . pantoprazole  40 mg Oral Daily  . pravastatin  40 mg Oral q1800  . sodium chloride  3 mL Intravenous Q12H  . tamsulosin  0.4 mg Oral Daily  . tiotropium  18 mcg Inhalation Daily  . vancomycin  125 mg Oral Q6H   Continuous Infusions: . sodium chloride    . 0.9 % NaCl with KCl 20 mEq / L 50 mL/hr at 04/23/13 1023  . 0.9 % NaCl with KCl 20 mEq / L    . sodium chloride 0.9 % 1,000 mL with potassium chloride 40 mEq infusion     PRN Meds:.acetaminophen, ondansetron (ZOFRAN) IV, ondansetron  Allergies as of 04/23/2013 - Review Complete 04/23/2013  Allergen Reaction Noted  . Floxin [ofloxacin] Other (See Comments) 06/11/2011  . Penicillins Other (See Comments) 06/11/2011  . Sulfa antibiotics Other (See Comments) 06/11/2011    History  reviewed. No pertinent family history.  History   Social History  . Marital Status: Widowed    Spouse Name: N/A    Number of Children: 2  . Years of Education: N/A   Occupational History  . retired     Social History Main Topics  . Smoking status: Former Smoker -- 1.00 packs/day for 40 years    Types: Cigarettes    Quit date: 06/15/1971  . Smokeless tobacco: Never Used  . Alcohol Use: Yes     Comment: sparingly  . Drug Use: No  . Sexual Activity: Not on file   Other Topics Concern  . Not on file   Social History Narrative  . No narrative on file    Review of Systems: All negative except as stated above in HPI.  Physical Exam: Vital signs: Filed Vitals:   04/23/13 0742  BP: 150/61  Pulse: 67  Temp:  97.6 F (36.4 C)  Resp: 24   Last BM Date: 04/23/13 General:   Elderly, frail, thin, awake, no acute distress Lungs:  Clear throughout to auscultation.   No wheezes, crackles, or rhonchi. No acute distress. Heart:  Regular rate and rhythm; no murmurs, clicks, rubs,  or gallops. Abdomen: soft, NT, ND, +BS  Rectal:  Deferred Ext: no edema  GI:  Lab Results:  Recent Labs  04/23/13 0240  WBC 6.7  HGB 13.6  HCT 40.1  PLT 264   BMET  Recent Labs  04/23/13 0240  NA 141  K 2.7*  CL 104  CO2 24  GLUCOSE 97  BUN 14  CREATININE 1.04  CALCIUM 8.9   LFT  Recent Labs  04/23/13 0240  PROT 6.7  ALBUMIN 2.9*  AST 19  ALT 8  ALKPHOS 63  BILITOT 0.3   PT/INR No results found for this basename: LABPROT, INR,  in the last 72 hours   Studies/Results: Ct Abdomen Pelvis W Contrast  04/23/2013   CLINICAL DATA:  Diarrhea for 6 weeks.  EXAM: CT ABDOMEN AND PELVIS WITH CONTRAST  TECHNIQUE: Multidetector CT imaging of the abdomen and pelvis was performed using the standard protocol following bolus administration of intravenous contrast.  CONTRAST:  OMNIPAQUE IOHEXOL 300 MG/ML  SOLN  COMPARISON:  None.  FINDINGS: Honeycombing and mild fibrotic change are seen at the lung bases. A moderate hiatal hernia is seen; apparent decreased attenuation within the hernia is thought to reflect intraluminal contents.  A few small hypodensities within the right hepatic lobe, measuring up to 9 mm, are nonspecific but likely reflect small cysts. The spleen is unremarkable in appearance. Mild apparent wall thickening at the fundus of the gallbladder may reflect partial decompression; no definite mass is seen. The pancreas and adrenal glands are unremarkable.  Scattered bilateral renal cysts are seen, measuring up to 2.6 cm in size. Mild nonspecific perinephric stranding is noted bilaterally. There is no evidence of hydronephrosis. No renal or ureteral stones are seen.  No free fluid is identified. The  small bowel is unremarkable in appearance. The stomach is within normal limits. No acute vascular abnormalities are seen.  The patient is status post appendectomy. Mild diverticulosis is noted along the distal descending and sigmoid colon. Note is made of unusual wall enhancement and thickening along the sigmoid colon, and to a lesser extent along the distal descending colon, concerning for an infectious process.  The bladder is mildly distended and grossly unremarkable in appearance. The prostate remains normal in size, with minimal calcification. A small to moderate right  inguinal hernia is seen, containing only fat. No inguinal lymphadenopathy is seen.  No acute osseous abnormalities are identified. Mild degenerative change is noted along the lower lumbar spine. There is mild chronic loss of height at T12.  IMPRESSION: 1. Unusual wall enhancement and thickening along the sigmoid colon, and to a lesser extent along the distal descending colon, concerning for an infectious process. An inflammatory process might have a similar appearance. 2. Mild diverticulosis along the distal descending and sigmoid colon. 3. Moderate hiatal hernia noted. 4. Likely small hepatic cysts and bilateral renal cysts. 5. Small to moderate right inguinal hernia, containing only fat.   Electronically Signed   By: Roanna Raider M.D.   On: 04/23/2013 05:50    Impression/Plan: 79 yo with chronic watery diarrhea and no pain question inflammation vs ischemia vs infection. Malignancy also possible if an obstructive process is present. Needs flex sig as next step and will do with tap water enemas. C. Diff negative. F/U on other stool studies that are pending.    LOS: 0 days   Loukisha Gunnerson C.  04/23/2013, 2:05 PM

## 2013-04-24 NOTE — Interval H&P Note (Signed)
History and Physical Interval Note:  04/24/2013 1:28 PM  Jim Contreras  has presented today for surgery, with the diagnosis of diarrhea  The various methods of treatment have been discussed with the patient and family. After consideration of risks, benefits and other options for treatment, the patient has consented to  Procedure(s): FLEXIBLE SIGMOIDOSCOPY (N/A) as a surgical intervention .  The patient's history has been reviewed, patient examined, no change in status, stable for surgery.  I have reviewed the patient's chart and labs.  Questions were answered to the patient's satisfaction.     [name]

## 2013-04-24 NOTE — Clinical Documentation Improvement (Signed)
  To Hospitalist MD, NP, and PA's   Nutritionist evaluation on 04/23/13  stating  patient meets criteria for "Severe malnutrition in the context of chronic"  Illness, please document clinical condition if appropriate.  Thank you      Possible Clinical Conditions?  Severe Malnutrition    Protein Calorie Malnutrition  Severe Protein Calorie MalnutritionCachexia    Other Condition  Cannot clinically determine    Supporting Information: "Pt with 14% wt loss x 1 year; 12% of this weight loss has been within the past 6 months (this is significant for time frame"  Risk Factors: Sigmoid colitis, age,  h/o CVA, C diff,   Recommendations: "Add Resource Breeze po BID, each supplement provides 250 kcal and 9 grams of protein"   Thank You, Lavonda Jumbo ,RN Clinical Documentation Specialist:  [phone]  [facility]- Health Information Management

## 2013-04-24 NOTE — Op Note (Signed)
[facility] [address] [place], [address]   FLEXIBLE SIGMOIDOSCOPY PROCEDURE REPORT  PATIENT: Jim Contreras, Jim Contreras  MR#: [id number] BIRTHDATE: May 28, 1924 , 89  yrs. old GENDER: Male ENDOSCOPIST: Charlott Rakes, MD REFERRED BY:  hospital team PROCEDURE DATE:  04/24/2013 PROCEDURE:     Flexible Sigmoidoscopy with biopsies ASA CLASS:    Class III INDICATIONS: Diarrhea MEDICATIONS:  Fentanyl 25 mcg IV, Versed 3 mg IV  DESCRIPTION OF PROCEDURE:   Rectal exam unrevealing. A pediatric colonoscope inserted into a poorly prepped colon and advanced to the transverse colon. On withdrawal no endoscopic evidence of inflammation is seen. Random biopsies taken to send for histology. Scattered diverticuli noted in the sigmoid colon. Retroflexion normal.        COMPLICATIONS:  None  ENDOSCOPIC IMPRESSION: Sigmoid diverticulosis; No evidence of colitis  RECOMMENDATIONS:  F/U on colonic biopsies   _______________________________ eSignedCharlott Rakes, MD 04/24/2013 2:08 PM

## 2013-04-25 ENCOUNTER — Encounter (HOSPITAL_COMMUNITY): Payer: Self-pay | Admitting: Gastroenterology

## 2013-04-25 DIAGNOSIS — R4182 Altered mental status, unspecified: Secondary | ICD-10-CM

## 2013-04-25 DIAGNOSIS — R197 Diarrhea, unspecified: Secondary | ICD-10-CM

## 2013-04-25 DIAGNOSIS — I359 Nonrheumatic aortic valve disorder, unspecified: Secondary | ICD-10-CM

## 2013-04-25 LAB — BASIC METABOLIC PANEL
BUN: 6 mg/dL (ref 6–23)
CO2: 22 mEq/L (ref 19–32)
Chloride: 109 mEq/L (ref 96–112)
Glucose, Bld: 83 mg/dL (ref 70–99)
Potassium: 3.7 mEq/L (ref 3.5–5.1)
Sodium: 140 mEq/L (ref 135–145)

## 2013-04-25 LAB — GI PATHOGEN PANEL BY PCR, STOOL
C difficile toxin A/B: NEGATIVE
Campylobacter by PCR: NEGATIVE
Cryptosporidium by PCR: NEGATIVE
E coli (ETEC) LT/ST: NEGATIVE
E coli (STEC): NEGATIVE
E coli 0157 by PCR: NEGATIVE
G lamblia by PCR: NEGATIVE
Norovirus GI/GII: NEGATIVE
Rotavirus A by PCR: NEGATIVE
Salmonella by PCR: NEGATIVE
Shigella by PCR: NEGATIVE

## 2013-04-25 LAB — CBC
HCT: 38.1 % — ABNORMAL LOW (ref 39.0–52.0)
Hemoglobin: 12.6 g/dL — ABNORMAL LOW (ref 13.0–17.0)
MCH: 28.4 pg (ref 26.0–34.0)
RBC: 4.43 MIL/uL (ref 4.22–5.81)

## 2013-04-25 MED ORDER — BUDESONIDE 3 MG PO CP24
9.0000 mg | ORAL_CAPSULE | Freq: Every day | ORAL | Status: DC
  Administered 2013-04-25 – 2013-04-26 (×2): 9 mg via ORAL
  Filled 2013-04-25 (×3): qty 3

## 2013-04-25 NOTE — Clinical Social Work Psychosocial (Signed)
Clinical Social Work Department BRIEF PSYCHOSOCIAL ASSESSMENT 04/25/2013  Patient:  DETRELL, UMSCHEID     Account Number:  [id number]     Admit date:  04/23/2013  Clinical Social Worker:  Lavell Luster  Date/Time:  04/25/2013 03:45 PM  Referred by:  Physician  Date Referred:  04/25/2013 Referred for  SNF Placement   Other Referral:   Interview type:  Patient Other interview type:   Patient alert and oriented at time of assessment.    PSYCHOSOCIAL DATA Living Status:  FACILITY Admitted from facility[facility]  [facility] Level of care:  Assisted Living Primary support name:  Coy Rochford. (son) and Evalee Mutton (daughter) Primary support relationship to patient:  CHILD, ADULT Degree of support available:   Support is stong. Contacts: [place] [phone] and Bonita Quin [phone] or [phone].    CURRENT CONCERNS Current Concerns  Post-Acute Placement   Other Concerns:    SOCIAL WORK ASSESSMENT / PLAN CSW met with patient to discuss recommendation for SNF. Patient states that he has already spoken with [facility] and that they will take him back. CSW will confirm this with facility. Patient resides at ALF. Patient has two children, Fayrene Fearing and Bonita Quin. CSW explained to patient that CSW will contact facility to confirm this plan and give the facility the documentation they need.   Assessment/plan status:  Psychosocial Support/Ongoing Assessment of Needs Other assessment/ plan:   Complete FL2, Fax   Information/referral to community resources:   CSW contact information given to patient.    PATIENT'S/FAMILY'S RESPONSE TO PLAN OF CARE: Patient plans to return to ALF upon DC. Patient was pleasant and jovial during assessment. Patient was appreciative of CSW contact. CSW will follow up on plan to DC to ALF.       Roddie Mc, [place], [place], [phone]

## 2013-04-25 NOTE — Progress Notes (Signed)
[facility]                                                                                Patient Demographics  Jim Contreras, is a 79 y.o. male, DOB - Jul 20, 1923, ZOX:[id number]  Admit date - 04/23/2013   Admitting Physician [name], MD  Outpatient Primary MD for the patient is [name], MD  LOS - 2   Chief Complaint  Patient presents with  . Diarrhea      Subjective:   [name] today has, No headache, No chest pain, No abdominal pain - No Nausea, No new weakness tingling or numbness, No Cough - SOB.  Diarrhea improved.   Assessment & Plan    1. Diarrhea ongoing for 2 months. C. difficile negative, stool PCR pending, CT scan suggestive of nonspecific sigmoid colon colitis. At his age suspicious for ischemic versus malignant changes, GI has been consulted and he is going for a sigmoidoscopy on 04/24/2013. Defer further workup to GI. His diarrhea is some better with supportive care, will add Imodium as needed. -Stools negative for C. difficile and ova/parasites. Imodium started.  2. Hypokalemia. Replace and monitor  3. Hypertension blood pressure stable on home regimen which includes Norvasc and ARB.  4. Dyslipidemia stable on home dose statin.    5. BPH no signs of your attention, is on Flomax and Avodart both will be continued.   Code Status: Full  Family Communication:   Disposition Plan: Home   Procedures scheduled for a sigmoidoscopy on 04/24/2013  Consults GI  Medications  Scheduled Meds: . amLODipine  5 mg Oral Daily  . aspirin  81 mg Oral Daily  . dutasteride  0.5 mg Oral Daily  . feeding supplement (RESOURCE BREEZE)  1 Container Oral BID BM  . heparin  5,000 Units Subcutaneous Q8H  . losartan  50 mg Oral Daily  . magnesium sulfate  2 g Intravenous Once  . pantoprazole  40 mg Oral Daily  . pravastatin  40 mg Oral q1800  . sodium chloride  3 mL Intravenous Q12H  . tamsulosin  0.4 mg Oral Daily  . tiotropium  18 mcg Inhalation  Daily   Continuous Infusions:   PRN Meds:.acetaminophen, loperamide, ondansetron (ZOFRAN) IV, ondansetron  DVT Prophylaxis  Heparin    Lab Results  Component Value Date   PLT 228 04/25/2013    Antibiotics     Anti-infectives   Start     Dose/Rate Route Frequency Ordered Stop   04/23/13 0715  vancomycin (VANCOCIN) 50 mg/mL oral solution 125 mg  Status:  Discontinued     125 mg Oral 4 times per day 04/23/13 0707 04/24/13 0907   04/23/13 0615  metroNIDAZOLE (FLAGYL) IVPB 500 mg  Status:  Discontinued     500 mg 100 mL/hr over 60 Minutes Intravenous Every 8 hours 04/23/13 0604 04/23/13 0707   04/23/13 0600  metroNIDAZOLE (FLAGYL) tablet 500 mg  Status:  Discontinued     500 mg Oral  Once 04/23/13 0557 04/23/13 0604         Objective:   Filed Vitals:   04/24/13 2121 04/25/13 0457 04/25/13 0512 04/25/13 0832  BP: 134/68  129/57  Pulse: 60  58   Temp: 98.1 F (36.7 C)  98.6 F (37 C)   TempSrc: Oral  Oral   Resp: 16  18   Height:      Weight:  63.8 kg (140 lb 10.5 oz) 61.4 kg (135 lb 5.8 oz)   SpO2: 91%  91% 90%    Wt Readings from Last 3 Encounters:  04/25/13 61.4 kg (135 lb 5.8 oz)  04/25/13 61.4 kg (135 lb 5.8 oz)  12/31/12 66.2 kg (145 lb 15.1 oz)     Intake/Output Summary (Last 24 hours) at 04/25/13 1033 Last data filed at 04/24/13 1400  Gross per 24 hour  Intake    220 ml  Output      0 ml  Net    220 ml    Exam Awake Alert, Oriented X 3, No new F.N deficits, Normal affect [place].AT,PERRAL Supple Neck,No JVD, No cervical lymphadenopathy appriciated.  Symmetrical Chest wall movement, Good air movement bilaterally, CTAB RRR,No Gallops,Rubs or new Murmurs, No Parasternal Heave +ve B.Sounds, Abd Soft, Non tender, No organomegaly appriciated, No rebound - guarding or rigidity. No Cyanosis, Clubbing or edema, No new Rash or bruise      Data Review   Micro Results Recent Results (from the past 240 hour(s))  STOOL CULTURE     Status: None   Collection  Time    04/23/13  8:48 AM      Result Value Range Status   Specimen Description STOOL   Final   Special Requests NONE   Final   Culture     Final   Value: NO SUSPICIOUS COLONIES, CONTINUING TO HOLD     Performed at [facility]   Report Status PENDING   Incomplete  OVA AND PARASITE EXAMINATION     Status: None   Collection Time    04/23/13  8:48 AM      Result Value Range Status   Specimen Description STOOL   Final   Special Requests NONE   Final   Ova and parasites     Final   Value: NO OVA OR PARASITES SEEN     Performed at [facility]   Report Status 04/24/2013 FINAL   Final  CLOSTRIDIUM DIFFICILE BY PCR     Status: None   Collection Time    04/23/13  8:48 AM      Result Value Range Status   C difficile by pcr NEGATIVE  NEGATIVE Final  MRSA PCR SCREENING     Status: Abnormal   Collection Time    04/24/13  8:37 PM      Result Value Range Status   MRSA by PCR POSITIVE (*) NEGATIVE Final   Comment:            The GeneXpert MRSA Assay (FDA     approved for NASAL specimens     only), is one component of a     comprehensive MRSA colonization     surveillance program. It is not     intended to diagnose MRSA     infection nor to guide or     monitor treatment for     MRSA infections.     RESULT CALLED TO, READ BACK BY AND VERIFIED WITH:     L YNOT RN AT 0101 BY L.PITT 04/25/13    Radiology Reports Ct Abdomen Pelvis W Contrast  04/23/2013   CLINICAL DATA:  Diarrhea for 6 weeks.  EXAM: CT ABDOMEN AND PELVIS WITH CONTRAST  TECHNIQUE: Multidetector CT imaging of the abdomen and pelvis was performed using the standard protocol following bolus administration of intravenous contrast.  CONTRAST:  OMNIPAQUE IOHEXOL 300 MG/ML  SOLN  COMPARISON:  None.  FINDINGS: Honeycombing and mild fibrotic change are seen at the lung bases. A moderate hiatal hernia is seen; apparent decreased attenuation within the hernia is thought to reflect intraluminal contents.  A few  small hypodensities within the right hepatic lobe, measuring up to 9 mm, are nonspecific but likely reflect small cysts. The spleen is unremarkable in appearance. Mild apparent wall thickening at the fundus of the gallbladder may reflect partial decompression; no definite mass is seen. The pancreas and adrenal glands are unremarkable.  Scattered bilateral renal cysts are seen, measuring up to 2.6 cm in size. Mild nonspecific perinephric stranding is noted bilaterally. There is no evidence of hydronephrosis. No renal or ureteral stones are seen.  No free fluid is identified. The small bowel is unremarkable in appearance. The stomach is within normal limits. No acute vascular abnormalities are seen.  The patient is status post appendectomy. Mild diverticulosis is noted along the distal descending and sigmoid colon. Note is made of unusual wall enhancement and thickening along the sigmoid colon, and to a lesser extent along the distal descending colon, concerning for an infectious process.  The bladder is mildly distended and grossly unremarkable in appearance. The prostate remains normal in size, with minimal calcification. A small to moderate right inguinal hernia is seen, containing only fat. No inguinal lymphadenopathy is seen.  No acute osseous abnormalities are identified. Mild degenerative change is noted along the lower lumbar spine. There is mild chronic loss of height at T12.  IMPRESSION: 1. Unusual wall enhancement and thickening along the sigmoid colon, and to a lesser extent along the distal descending colon, concerning for an infectious process. An inflammatory process might have a similar appearance. 2. Mild diverticulosis along the distal descending and sigmoid colon. 3. Moderate hiatal hernia noted. 4. Likely small hepatic cysts and bilateral renal cysts. 5. Small to moderate right inguinal hernia, containing only fat.   Electronically Signed   By: Roanna Raider M.D.   On: 04/23/2013 05:50     CBC  Recent Labs Lab 04/23/13 0240 04/24/13 0555 04/25/13 0629  WBC 6.7 6.2 5.5  HGB 13.6 12.9* 12.6*  HCT 40.1 39.1 38.1*  PLT 264 243 228  MCV 83.9 85.7 86.0  MCH 28.5 28.3 28.4  MCHC 33.9 33.0 33.1  RDW 15.2 15.4 15.4  LYMPHSABS 0.9  --   --   MONOABS 1.2*  --   --   EOSABS 0.7  --   --   BASOSABS 0.0  --   --     Chemistries   Recent Labs Lab 04/23/13 0240 04/23/13 0639 04/23/13 1330 04/24/13 0555 04/25/13 0629  NA 141  --   --  142 140  K 2.7*  --  3.5 3.4* 3.7  CL 104  --   --  109 109  CO2 24  --   --  22 22  GLUCOSE 97  --   --  85 83  BUN 14  --   --  7 6  CREATININE 1.04  --   --  0.83 0.79  CALCIUM 8.9  --   --  8.4 8.1*  MG  --  1.9 1.8  --   --   AST 19  --   --   --   --   ALT  8  --   --   --   --   ALKPHOS 63  --   --   --   --   BILITOT 0.3  --   --   --   --    ------------------------------------------------------------------------------------------------------------------ estimated creatinine clearance is 56.5 ml/min (by C-G formula based on Cr of 0.79). ------------------------------------------------------------------------------------------------------------------ No results found for this basename: HGBA1C,  in the last 72 hours ------------------------------------------------------------------------------------------------------------------ No results found for this basename: CHOL, HDL, LDLCALC, TRIG, CHOLHDL, LDLDIRECT,  in the last 72 hours ------------------------------------------------------------------------------------------------------------------ No results found for this basename: TSH, T4TOTAL, FREET3, T3FREE, THYROIDAB,  in the last 72 hours ------------------------------------------------------------------------------------------------------------------ No results found for this basename: VITAMINB12, FOLATE, FERRITIN, TIBC, IRON, RETICCTPCT,  in the last 72 hours  Coagulation profile No results found for this basename:  INR, PROTIME,  in the last 168 hours  No results found for this basename: DDIMER,  in the last 72 hours  Cardiac Enzymes No results found for this basename: CK, CKMB, TROPONINI, MYOGLOBIN,  in the last 168 hours ------------------------------------------------------------------------------------------------------------------ No components found with this basename: POCBNP,      Time Spent in minutes  35   Jen Eppinger A M.D on 04/25/2013 at 10:33 AM  Between 7am to 7pm - Pager - [phone]  After 7pm go to [email or web address] - password [email or web address]  And look for the night coverage person covering for me after hours  [facility] Office  [phone]

## 2013-04-25 NOTE — Progress Notes (Addendum)
Patient ID: Jim Contreras, male   DOB: Apr 29, 1924, 79 y.o.   MRN: [id number] [facility] Gastroenterology Progress Note  WAYMAN HOARD 79 y.o. [birth year]   Subjective: Diarrhea not as frequent but reports still loose.  Objective: Vital signs in last 24 hours: Filed Vitals:   04/25/13 0512  BP: 129/57  Pulse: 58  Temp: 98.6 F (37 C)  Resp: 18    Physical Exam: Gen: elderly, frail, no acute distress Abd: soft, nontender, nondistended, +BS  Lab Results:  Recent Labs  04/23/13 0639 04/23/13 1330 04/24/13 0555 04/25/13 0629  NA  --   --  142 140  K  --  3.5 3.4* 3.7  CL  --   --  109 109  CO2  --   --  22 22  GLUCOSE  --   --  85 83  BUN  --   --  7 6  CREATININE  --   --  0.83 0.79  CALCIUM  --   --  8.4 8.1*  MG 1.9 1.8  --   --     Recent Labs  04/23/13 0240  AST 19  ALT 8  ALKPHOS 63  BILITOT 0.3  PROT 6.7  ALBUMIN 2.9*    Recent Labs  04/23/13 0240 04/24/13 0555 04/25/13 0629  WBC 6.7 6.2 5.5  NEUTROABS 4.0  --   --   HGB 13.6 12.9* 12.6*  HCT 40.1 39.1 38.1*  MCV 83.9 85.7 86.0  PLT 264 243 228   No results found for this basename: LABPROT, INR,  in the last 72 hours    Assessment/Plan: 79 yo with persistent diarrhea likely due to collagenous colitis seen on biopsies from colon. GI pathogen panel pending but stool cx NGTD, ova and parasites and C.diff negative. Will start Budesonide 9 mg/day. Supportive care.    Avyon Herendeen C. 04/25/2013, 12:44 PM

## 2013-04-25 NOTE — Progress Notes (Signed)
Lab called and informed pt positive for MRSA. Placed pt on contact prec.

## 2013-04-26 DIAGNOSIS — N400 Enlarged prostate without lower urinary tract symptoms: Secondary | ICD-10-CM

## 2013-04-26 LAB — CBC
HCT: 38.1 % — ABNORMAL LOW (ref 39.0–52.0)
Hemoglobin: 12.6 g/dL — ABNORMAL LOW (ref 13.0–17.0)
MCH: 28.2 pg (ref 26.0–34.0)
MCV: 85.2 fL (ref 78.0–100.0)
RBC: 4.47 MIL/uL (ref 4.22–5.81)
WBC: 4.1 10*3/uL (ref 4.0–10.5)

## 2013-04-26 MED ORDER — CHLORHEXIDINE GLUCONATE CLOTH 2 % EX PADS
6.0000 | MEDICATED_PAD | Freq: Every day | CUTANEOUS | Status: DC
  Administered 2013-04-26: 16:00:00 6 via TOPICAL

## 2013-04-26 MED ORDER — ASPIRIN EC 81 MG PO TBEC: 81.0000 mg | DELAYED_RELEASE_TABLET | Freq: Every day | ORAL | Status: DC

## 2013-04-26 MED ORDER — MUPIROCIN 2 % EX OINT
1.0000 "application " | TOPICAL_OINTMENT | Freq: Two times a day (BID) | CUTANEOUS | Status: DC
  Administered 2013-04-26: 1 via NASAL
  Filled 2013-04-26: qty 22

## 2013-04-26 MED ORDER — BUDESONIDE 3 MG PO CP24: 9.0000 mg | ORAL_CAPSULE | Freq: Every day | ORAL | Status: DC

## 2013-04-26 NOTE — Clinical Social Work Note (Signed)
Per MD patient ready to DC back to [facility] 04/26/13. RN, patient, daughter, and facility notified of DC. Patient to be transported by daughter Evalee Mutton. All necessary clinicals faxed to ALF, and DC packet left with patient and daughter. Daughter was instructed to give packet to ALF upon arrival. CSW signing off.  Roddie Mc, [place], [place], [phone]

## 2013-04-26 NOTE — Progress Notes (Signed)
Nsg Discharge Note  Admit Date:  04/23/2013 Discharge date: 04/26/2013   Georgina Quint to be D/C'd assisted living Nursing Home per MD order.  AVS completed.  Copy for chart, and copy for patient signed, and dated. Patient/caregiver able to verbalize understanding.  Discharge Medication:   Medication List         acetaminophen 325 MG tablet  Commonly known as:  TYLENOL  Take 650 mg by mouth every 4 (four) hours as needed for mild pain.     amLODipine 5 MG tablet  Commonly known as:  NORVASC  Take 5 mg by mouth daily.     aspirin 81 MG tablet  Take 81 mg by mouth daily.     budesonide 3 MG 24 hr capsule  Commonly known as:  ENTOCORT EC  Take 3 capsules (9 mg total) by mouth daily.     dutasteride 0.5 MG capsule  Commonly known as:  AVODART  Take 0.5 mg by mouth daily.     lansoprazole 30 MG capsule  Commonly known as:  PREVACID  Take 30 mg by mouth daily at 12 noon.     loperamide 2 MG capsule  Commonly known as:  IMODIUM  Take 2-4 mg by mouth as needed for diarrhea or loose stools.     losartan 50 MG tablet  Commonly known as:  COZAAR  Take 50 mg by mouth daily.     pravastatin 40 MG tablet  Commonly known as:  PRAVACHOL  Take 40 mg by mouth daily.     tamsulosin 0.4 MG Caps capsule  Commonly known as:  FLOMAX  Take 0.4 mg by mouth daily.     tiotropium 18 MCG inhalation capsule  Commonly known as:  SPIRIVA HANDIHALER  Place 1 capsule (18 mcg total) into inhaler and inhale daily.        Discharge Assessment: Filed Vitals:   04/26/13 1507  BP: 127/61  Pulse: 58  Temp: 98.1 F (36.7 C)  Resp: 16   Skin clean, dry and intact without evidence of skin break down, no evidence of skin tears noted. IV catheter discontinued intact. Site without signs and symptoms of complications - no redness or edema noted at insertion site, patient denies c/o pain - only slight tenderness at site.  Dressing with slight pressure applied.  D/c  Instructions-Education: Discharge instructions given to patient/family with verbalized understanding. D/c education completed with patient/family including follow up instructions, medication list, d/c activities limitations if indicated, with other d/c instructions as indicated by MD - patient able to verbalize understanding, all questions fully answered. Patient instructed to return to ED, call 911, or call MD for any changes in condition.  Patient escorted via WC, and D/C home via private auto.  Romey Mathieson Consuella Lose, RN 04/26/2013 4:48 PM

## 2013-04-26 NOTE — Discharge Summary (Signed)
Physician Discharge Summary  Jim Contreras ZOX:[id number] DOB: 1924-04-13 DOA: 04/23/2013  PCP: [name], MD  Admit date: 04/23/2013 Discharge date: 04/26/2013  Time spent: 40 minutes  Recommendations for Outpatient Follow-up:  1. Followup with primary care physician within one week. 2. Followup with [facility] gastroenterology in 3 weeks  Discharge Diagnoses:  Principal Problem:   Diarrhea Active Problems:   ILD (interstitial lung disease)   Aortic stenosis   Hypokalemia   Discharge Condition: Stable  Diet recommendation: Regular diet  Filed Weights   04/25/13 0457 04/25/13 0512 04/26/13 0605  Weight: 63.8 kg (140 lb 10.5 oz) 61.4 kg (135 lb 5.8 oz) 63.1 kg (139 lb 1.8 oz)    History of present illness:  Jim Contreras is a 79 y.o. male with Past medical history of hypertension, interstitial lung disease, CVA.  The patient is coming from assisted living facility.  The patient presents with complaint of diarrhea, he mentions a symptom has been ongoing on and off since September, but since last 6 days he has daily loose watery bowel movement, without any blood, without any abdominal pain. He also complains of fatigue and tiredness ongoing since last few days as well.  There is no fever, chills, chest pain, shortness of breath, abdominal pain, burning urination, leg swelling.  Recently he has been treated with oral ciprofloxacin for possible UTI and then there was another course of oral Flagyl for diarrhea.  Hospital Course:   1. Collagenous microscopic colitis: Patient came in to the hospital because of daily loose watery bowel movements. Patient denies any blood, denies any abdominal pain and weight loss. The diet is been going on for the past 2 months, patient tried Imodium without great success. Patient was recently treated with oral ciprofloxacin for possible UTI. At the time of admission stool culture as well as C. difficile PCR came back negative. CT scan was done and  showed nonspecific colitis involving the distal part of the descending colon as well as sigmoid colon. Gastroenterology was consulted, Flexible sigmoidoscopy was done on 04/24/2013 by Dr. Bosie Clos and showed diverticulosis without diverticulitis, random biopsies were taken and sent to histology which showed colitis microscopic colitis. Patient started on budesonide. The recommended dose is 9 mg/day for four weeks. If the patient is in remission (<3 stools daily and no watery stools), we taper to 6 mg for two weeks, to 3 mg for another two weeks, and then discontinue therapy. Patient to continue Imodium for symptomatic management.  2. Hypokalemia: Presented with potassium of 2.7, likely secondary to severe diarrhea. This was repleted by oral supplements.  3. Hypertension: Blood pressure stable, patient is on Norvasc and losartan. Home medication continued throughout the hospital stay.  4. Chronic respiratory failure: Secondary to interstitial lung disease, patient follows with [facility]. He is on 2 L of oxygen continuously, no changes during this hospital stay.  Procedures:  Flexible sigmoidoscopy with biopsies done on 04/24/2013 by Dr. Bosie Clos., which showed collagenous microscopic colitis.   Consultations:  [facility]  Discharge Exam: Filed Vitals:   04/26/13 1507  BP: 127/61  Pulse: 58  Temp: 98.1 F (36.7 C)  Resp: 16   General: Alert and awake, oriented x3, not in any acute distress. HEENT: anicteric sclera, pupils reactive to light and accommodation, EOMI CVS: S1-S2 clear, no murmur rubs or gallops Chest: clear to auscultation bilaterally, no wheezing, rales or rhonchi Abdomen: soft nontender, nondistended, normal bowel sounds, no organomegaly Extremities: no cyanosis, clubbing or edema noted bilaterally Neuro:  Cranial nerves II-XII intact, no focal neurological deficits  Discharge Instructions      Discharge Orders   Future Appointments Provider  Department Dept Phone   04/30/2013 1:00 PM [facility] E[facility] [phone]   04/30/2013 2:00 PM [facility] [phone]   05/01/2013 10:00 AM [facility] [place] [address] [phone]   Patient to arrive 15 minutes prior to appointment time.   05/01/2013 11:00 AM Storm Frisk, MD [facility] [phone]   Future Orders Complete By Expires   Increase activity slowly  As directed        Medication List         acetaminophen 325 MG tablet  Commonly known as:  TYLENOL  Take 650 mg by mouth every 4 (four) hours as needed for mild pain.     amLODipine 5 MG tablet  Commonly known as:  NORVASC  Take 5 mg by mouth daily.     aspirin 81 MG tablet  Take 81 mg by mouth daily.     budesonide 3 MG 24 hr capsule  Commonly known as:  ENTOCORT EC  Take 3 capsules (9 mg total) by mouth daily.     dutasteride 0.5 MG capsule  Commonly known as:  AVODART  Take 0.5 mg by mouth daily.     lansoprazole 30 MG capsule  Commonly known as:  PREVACID  Take 30 mg by mouth daily at 12 noon.     loperamide 2 MG capsule  Commonly known as:  IMODIUM  Take 2-4 mg by mouth as needed for diarrhea or loose stools.     losartan 50 MG tablet  Commonly known as:  COZAAR  Take 50 mg by mouth daily.     pravastatin 40 MG tablet  Commonly known as:  PRAVACHOL  Take 40 mg by mouth daily.     tamsulosin 0.4 MG Caps capsule  Commonly known as:  FLOMAX  Take 0.4 mg by mouth daily.     tiotropium 18 MCG inhalation capsule  Commonly known as:  SPIRIVA HANDIHALER  Place 1 capsule (18 mcg total) into inhaler and inhale daily.       Allergies  Allergen Reactions  . Floxin [Ofloxacin] Other (See Comments)    Lost feeling in feet  . Penicillins Other (See Comments)    Ulcers in mouth  . Sulfa Antibiotics Other (See Comments)    Ulcers in mouth   Follow-up  Information   Follow up with [name], MD In 1 week.   Specialty:  Specialist   Contact information:   [address] [place] [address] [phone]       Follow up with Shirley Friar., MD In 3 weeks.   Specialty:  Gastroenterology   Contact information:   [address] [place] [address] [phone]        The results of significant diagnostics from this hospitalization (including imaging, microbiology, ancillary and laboratory) are listed below for reference.    Significant Diagnostic Studies: Ct Abdomen Pelvis W Contrast  04/23/2013   CLINICAL DATA:  Diarrhea for 6 weeks.  EXAM: CT ABDOMEN AND PELVIS WITH CONTRAST  TECHNIQUE: Multidetector CT imaging of the abdomen and pelvis was performed using the standard protocol following bolus administration of intravenous contrast.  CONTRAST:  OMNIPAQUE IOHEXOL 300 MG/ML  SOLN  COMPARISON:  None.  FINDINGS: Honeycombing and mild fibrotic change  are seen at the lung bases. A moderate hiatal hernia is seen; apparent decreased attenuation within the hernia is thought to reflect intraluminal contents.  A few small hypodensities within the right hepatic lobe, measuring up to 9 mm, are nonspecific but likely reflect small cysts. The spleen is unremarkable in appearance. Mild apparent wall thickening at the fundus of the gallbladder may reflect partial decompression; no definite mass is seen. The pancreas and adrenal glands are unremarkable.  Scattered bilateral renal cysts are seen, measuring up to 2.6 cm in size. Mild nonspecific perinephric stranding is noted bilaterally. There is no evidence of hydronephrosis. No renal or ureteral stones are seen.  No free fluid is identified. The small bowel is unremarkable in appearance. The stomach is within normal limits. No acute vascular abnormalities are seen.  The patient is status post appendectomy. Mild diverticulosis is noted along the distal descending and  sigmoid colon. Note is made of unusual wall enhancement and thickening along the sigmoid colon, and to a lesser extent along the distal descending colon, concerning for an infectious process.  The bladder is mildly distended and grossly unremarkable in appearance. The prostate remains normal in size, with minimal calcification. A small to moderate right inguinal hernia is seen, containing only fat. No inguinal lymphadenopathy is seen.  No acute osseous abnormalities are identified. Mild degenerative change is noted along the lower lumbar spine. There is mild chronic loss of height at T12.  IMPRESSION: 1. Unusual wall enhancement and thickening along the sigmoid colon, and to a lesser extent along the distal descending colon, concerning for an infectious process. An inflammatory process might have a similar appearance. 2. Mild diverticulosis along the distal descending and sigmoid colon. 3. Moderate hiatal hernia noted. 4. Likely small hepatic cysts and bilateral renal cysts. 5. Small to moderate right inguinal hernia, containing only fat.   Electronically Signed   By: Roanna Raider M.D.   On: 04/23/2013 05:50    Microbiology: Recent Results (from the past 240 hour(s))  STOOL CULTURE     Status: None   Collection Time    04/23/13  8:48 AM      Result Value Range Status   Specimen Description STOOL   Final   Special Requests NONE   Final   Culture     Final   Value: NO SUSPICIOUS COLONIES, CONTINUING TO HOLD     Performed at [facility]   Report Status PENDING   Incomplete  OVA AND PARASITE EXAMINATION     Status: None   Collection Time    04/23/13  8:48 AM      Result Value Range Status   Specimen Description STOOL   Final   Special Requests NONE   Final   Ova and parasites     Final   Value: NO OVA OR PARASITES SEEN     Performed at [facility]   Report Status 04/24/2013 FINAL   Final  CLOSTRIDIUM DIFFICILE BY PCR     Status: None   Collection Time    04/23/13  8:48 AM       Result Value Range Status   C difficile by pcr NEGATIVE  NEGATIVE Final  MRSA PCR SCREENING     Status: Abnormal   Collection Time    04/24/13  8:37 PM      Result Value Range Status   MRSA by PCR POSITIVE (*) NEGATIVE Final   Comment:  The GeneXpert MRSA Assay (FDA     approved for NASAL specimens     only), is one component of a     comprehensive MRSA colonization     surveillance program. It is not     intended to diagnose MRSA     infection nor to guide or     monitor treatment for     MRSA infections.     RESULT CALLED TO, READ BACK BY AND VERIFIED WITH:     L YNOT RN AT 0101 BY L.PITT 04/25/13     Labs: Basic Metabolic Panel:  Recent Labs Lab 04/23/13 0240 04/23/13 0639 04/23/13 1330 04/24/13 0555 04/25/13 0629  NA 141  --   --  142 140  K 2.7*  --  3.5 3.4* 3.7  CL 104  --   --  109 109  CO2 24  --   --  22 22  GLUCOSE 97  --   --  85 83  BUN 14  --   --  7 6  CREATININE 1.04  --   --  0.83 0.79  CALCIUM 8.9  --   --  8.4 8.1*  MG  --  1.9 1.8  --   --    Liver Function Tests:  Recent Labs Lab 04/23/13 0240  AST 19  ALT 8  ALKPHOS 63  BILITOT 0.3  PROT 6.7  ALBUMIN 2.9*    Recent Labs Lab 04/23/13 0240  LIPASE 10*   No results found for this basename: AMMONIA,  in the last 168 hours CBC:  Recent Labs Lab 04/23/13 0240 04/24/13 0555 04/25/13 0629 04/26/13 0412  WBC 6.7 6.2 5.5 4.1  NEUTROABS 4.0  --   --   --   HGB 13.6 12.9* 12.6* 12.6*  HCT 40.1 39.1 38.1* 38.1*  MCV 83.9 85.7 86.0 85.2  PLT 264 243 228 259   Cardiac Enzymes: No results found for this basename: CKTOTAL, CKMB, CKMBINDEX, TROPONINI,  in the last 168 hours BNP: BNP (last 3 results) No results found for this basename: PROBNP,  in the last 8760 hours CBG:  Recent Labs Lab 04/24/13 0004  GLUCAP 76       Signed:  Dala Breault A  [facility] 04/26/2013, 4:12 PM

## 2013-04-26 NOTE — Progress Notes (Signed)
Patient ID: Jim Contreras, male   DOB: 28-Oct-1923, 79 y.o.   MRN: [id number] [facility] Gastroenterology Progress Note  CLEVESTER HELZER 79 y.o. 1923-10-08   Subjective: Feels better. One loose stool overnight.   Objective: Vital signs: Filed Vitals:   04/26/13 0605  BP: 127/65  Pulse: 54  Temp: 98.4 F (36.9 C)  Resp: 18    Physical Exam: Gen: alert, no acute distress  Abd: soft, nondistended, nontender  Lab Results:  Recent Labs  04/24/13 0555 04/25/13 0629  NA 142 140  K 3.4* 3.7  CL 109 109  CO2 22 22  GLUCOSE 85 83  BUN 7 6  CREATININE 0.83 0.79  CALCIUM 8.4 8.1*   No results found for this basename: AST, ALT, ALKPHOS, BILITOT, PROT, ALBUMIN,  in the last 72 hours  Recent Labs  04/25/13 0629 04/26/13 0412  WBC 5.5 4.1  HGB 12.6* 12.6*  HCT 38.1* 38.1*  MCV 86.0 85.2  PLT 228 259      Assessment/Plan: 79 yo with chronic diarrhea likely due to collagenous colitis with marked improvement one day after starting Budesonide. GI pathogen panel negative. Ok to d/c home on Budesonide 9 mg/day for 4-6 weeks. Follow up with me in 4 weeks.    Martinez Boxx C. 04/26/2013, 2:41 PM

## 2013-04-27 LAB — STOOL CULTURE

## 2013-04-29 LAB — OSMOLALITY, STOOL: Osmolality,Stl: 457 mOsm

## 2013-04-30 ENCOUNTER — Encounter (HOSPITAL_COMMUNITY): Payer: Medicare Other

## 2013-04-30 ENCOUNTER — Ambulatory Visit (HOSPITAL_COMMUNITY): Payer: Medicare Other

## 2013-05-01 ENCOUNTER — Ambulatory Visit (INDEPENDENT_AMBULATORY_CARE_PROVIDER_SITE_OTHER)
Admission: RE | Admit: 2013-05-01 | Discharge: 2013-05-01 | Disposition: A | Discharge status: Home / Self Care | Payer: Medicare Other | Source: Ambulatory Visit | Attending: Critical Care Medicine | Admitting: Critical Care Medicine

## 2013-05-01 ENCOUNTER — Ambulatory Visit (INDEPENDENT_AMBULATORY_CARE_PROVIDER_SITE_OTHER): Payer: Medicare Other | Admitting: Critical Care Medicine

## 2013-05-01 ENCOUNTER — Encounter: Payer: Self-pay | Admitting: Critical Care Medicine

## 2013-05-01 VITALS — BP 102/54 | HR 60 | Temp 97.5°F

## 2013-05-01 DIAGNOSIS — J9610 Chronic respiratory failure, unspecified whether with hypoxia or hypercapnia: Secondary | ICD-10-CM

## 2013-05-01 DIAGNOSIS — J438 Other emphysema: Secondary | ICD-10-CM

## 2013-05-01 DIAGNOSIS — J8410 Pulmonary fibrosis, unspecified: Secondary | ICD-10-CM

## 2013-05-01 DIAGNOSIS — J849 Interstitial pulmonary disease, unspecified: Secondary | ICD-10-CM

## 2013-05-01 DIAGNOSIS — K52831 Collagenous colitis: Secondary | ICD-10-CM

## 2013-05-01 DIAGNOSIS — K5289 Other specified noninfective gastroenteritis and colitis: Secondary | ICD-10-CM

## 2013-05-01 DIAGNOSIS — J439 Emphysema, unspecified: Secondary | ICD-10-CM

## 2013-05-01 DIAGNOSIS — Z23 Encounter for immunization: Secondary | ICD-10-CM

## 2013-05-01 NOTE — Progress Notes (Signed)
Subjective:    Patient ID: Jim Contreras, male    DOB: 05-03-1924, 79 y.o.   MRN: [id number]  HPI  79 y.o.  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   05/01/2013 Chief Complaint  Patient presents with  . Follow-up    Daughter reports she notices pt gets winded easier and sometimes with rest.   Cough - prod at times.    Pt just in hospital one week ago.   Another hosp stay for a fall and unconscious in the yard.  Pt was in SNF for prolonged period. Now is assisted Living [facility] .    Wheelchair to meals.  Notes some falls in appt.  Pt just admitted for diarrhea ( had this for 7weeks)>> diarrhea is better on entocort budesonide 9mg  /d capsules  Review of Systems  Constitutional:   No  weight loss, night sweats,  Fevers, chills, fatigue, lassitude. HEENT:   No headaches,  Difficulty swallowing,  Tooth/dental problems,  Sore throat,                No sneezing, itching, ear ache, nasal congestion, post nasal drip,   CV:  No chest pain,  Orthopnea, PND, swelling in lower extremities, anasarca, dizziness, palpitations  GI  No heartburn, indigestion, abdominal pain, nausea, vomiting, diarrhea, change in bowel habits, loss of appetite  Resp: No shortness of breath with exertion or at rest.  No excess mucus, no productive cough,  No non-productive cough,  No coughing up of blood.  No change in color of mucus.  No wheezing.  No chest wall deformity  Skin: no rash or lesions.  GU: no dysuria, change in color of urine, no urgency or frequency.  No flank pain.  MS:  No joint pain or swelling.  No decreased range of motion.  No back pain.  Psych:  No change in mood or affect. No depression or anxiety.  No memory loss.     Objective:   Physical Exam  Filed Vitals:   05/01/13 1055  BP: 102/54  Pulse: 60  Temp: 97.5 F (36.4 C)  TempSrc: Oral  SpO2: 91%     Gen: Pleasant, well-nourished, in no distress,  normal affect  ENT: No lesions,  mouth clear,  oropharynx clear, no postnasal drip  Neck: No JVD, no TMG, no carotid bruits  Lungs: No use of accessory muscles, no dullness to percussion, distant BS, dry rales  Cardiovascular: RRR, heart sounds normal, no murmur or gallops, no peripheral edema  Abdomen: soft and NT, no HSM,  BS normal  Musculoskeletal: No deformities, no cyanosis or clubbing  Neuro: alert, non focal  Skin: Warm, no lesions or rashes  Ct Chest Wo Contrast  05/01/2013   CLINICAL DATA:  Evaluate for interstitial lung disease.  EXAM: CT CHEST WITHOUT CONTRAST  TECHNIQUE: Multidetector CT imaging of the chest was performed following the standard protocol without IV contrast.  COMPARISON:  Chest CT 07/11/2012.  FINDINGS: Mediastinum: Heart size is normal. There is no significant pericardial fluid, thickening or pericardial calcification. There is atherosclerosis of the thoracic aorta, the great vessels of the mediastinum and the coronary arteries, including calcified atherosclerotic plaque in the left main, left anterior descending, left circumflex and right coronary arteries. Numerous borderline enlarged mediastinal and bilateral hilar lymph nodes are nonspecific and presumably reactive) these are similar to the prior examination). Esophagus is unremarkable in appearance. Calcifications of  the aortic valve.  Lungs/Pleura: Again noted is a background of moderate to severe centrilobular and paraseptal emphysema, most pronounced at the lung apices bilaterally. There is a new 1.4 x 1.1 cm nodule in the left lower lobe (image 36 of series 5), surrounded by numerous tiny satellite nodules. As with the prior study, there is extensive patchy areas of peripheral ground-glass attenuation and subpleural reticulation, as well as some peripheral bronchiectasis and bronchiolectasis. These findings are basilar predominant. No frank honeycombing  identified. No central areas of traction bronchiectasis identified at this time. Mild diffuse bronchial wall thickening. Throughout the lung bases bilaterally are developing diffuse parenchymal calcifications, particularly in the posterior aspect of the left lower lobe. Inspiratory and expiratory imaging is unremarkable.  Upper Abdomen: Unremarkable.  Musculoskeletal: Old compression fracture of the superior endplate of T12 with approximately 20% loss of anterior vertebral body height is unchanged. There are no aggressive appearing lytic or blastic lesions noted in the visualized portions of the skeleton.  IMPRESSION: 1. 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 Other differential considerations would include the possibility of metastatic calcification, however, this pattern is less favored to represent metastatic calcification (correlation for history of renal disease is suggested). 2. Otherwise, the previously described combination of moderate to severe centrilobular and paraseptal emphysema as well as a background of interstitial lung disease is very similar to the prior study. Given the lack of temporal progression compared to the prior examination, and lack of frank honeycombing, these findings may simply represent fibrotic phase NSIP (nonspecific interstitial pneumonia), however, this strong craniocaudal gradient makes exclusion of UIP (usual interstitial pneumonia) difficult (although lack of temporal progression suggests against UIP). 3. Interval development of a 1.4 x 1.1 cm left lower lobe nodule with surrounding satellite nodules. This finding is nonspecific, but given the surrounding satellite nodules, this is favored to be infectious or inflammatory. Attention on a short-term repeat chest CT in 3 months is suggested at this time. 4. Atherosclerosis,  including left main and 3 vessel coronary artery disease.   Electronically Signed   By: Trudie Reed M.D.   On: 05/01/2013 13:31     PFT 07/12/12:  Fev1 84%  TLC 101%  DLCO 61% PFT 11/10/12: FeV1 87% FeV1/FVC 86%  TLC 84%    DLCO 37%      Assessment & Plan:   ILD (interstitial lung disease) Interstitial lung disease on the basis of burned-out nonspecific interstitial pneumonitis primarily at the bases of the lungs This is not the patient's primary disease process and there is no indication for specific medication therapy Plan Maintain oxygen therapy No indication for disease modifying medication therapy  COPD with emphysema Gold C  Gold stage C. COPD with primary emphysematous component oxygen dependent Note recent CT of chest showed bullous emphysema changes Plan Maintain Spiriva Maintain oxygen therapy    Updated Medication List Outpatient Encounter Prescriptions as of 05/01/2013  Medication Sig  . acetaminophen (TYLENOL) 325 MG tablet Take 650 mg by mouth every 4 (four) hours as needed for mild pain.  Marland Kitchen amLODipine (NORVASC) 5 MG tablet Take 5 mg by mouth daily.   Marland Kitchen aspirin 81 MG tablet Take 81 mg by mouth daily.  . budesonide (ENTOCORT EC) 3 MG 24 hr capsule Take 3 capsules (9 mg total) by mouth daily.  Marland Kitchen dutasteride (AVODART) 0.5 MG capsule Take 0.5 mg by mouth daily.  . lansoprazole (PREVACID) 30 MG capsule  Take 30 mg by mouth daily at 12 noon.  . loperamide (IMODIUM) 2 MG capsule Take 2-4 mg by mouth as needed for diarrhea or loose stools.  Marland Kitchen losartan (COZAAR) 50 MG tablet Take 50 mg by mouth daily.    . pravastatin (PRAVACHOL) 40 MG tablet Take 40 mg by mouth daily.  . Tamsulosin HCl (FLOMAX) 0.4 MG CAPS Take 0.4 mg by mouth daily.  Marland Kitchen tiotropium (SPIRIVA HANDIHALER) 18 MCG inhalation capsule Place 1 capsule (18 mcg total) into inhaler and inhale daily.

## 2013-05-01 NOTE — Patient Instructions (Addendum)
Flu vaccine was given No change in medications I will call with CT Chest results Return 4 months

## 2013-05-02 DIAGNOSIS — J9610 Chronic respiratory failure, unspecified whether with hypoxia or hypercapnia: Secondary | ICD-10-CM | POA: Insufficient documentation

## 2013-05-02 NOTE — Assessment & Plan Note (Signed)
Gold stage C. COPD with primary emphysematous component oxygen dependent Note recent CT of chest showed bullous emphysema changes Plan Maintain Spiriva Maintain oxygen therapy

## 2013-05-02 NOTE — Assessment & Plan Note (Signed)
Interstitial lung disease on the basis of burned-out nonspecific interstitial pneumonitis primarily at the bases of the lungs This is not the patient's primary disease process and there is no indication for specific medication therapy Plan Maintain oxygen therapy No indication for disease modifying medication therapy

## 2013-09-14 ENCOUNTER — Other Ambulatory Visit: Payer: Self-pay | Admitting: Family Medicine

## 2013-09-14 DIAGNOSIS — R911 Solitary pulmonary nodule: Secondary | ICD-10-CM

## 2013-09-21 ENCOUNTER — Telehealth: Payer: Self-pay | Admitting: Critical Care Medicine

## 2013-09-21 DIAGNOSIS — J439 Emphysema, unspecified: Secondary | ICD-10-CM

## 2013-09-21 NOTE — Telephone Encounter (Signed)
Called daughter and is aware. [facility] since no openings in pulmonary for P[facility]. [facility]  She is aware pt to arrive 15 min early. No inhaler use 4 hrs prior to appt. Nothing further needed

## 2013-09-21 NOTE — Telephone Encounter (Signed)
Spoke with Jim Contreras. She reports pt has upcoming appt in May w/ PW. She is wanting to know if pt can have a PFT to see how his breathing capacity is since his breathing is becoming more labored. She would like this done before he is seen. Please advise Dr. Delford FieldWright thanks

## 2013-09-21 NOTE — Telephone Encounter (Signed)
Go ahead and do full pfts

## 2013-10-05 ENCOUNTER — Ambulatory Visit
Admission: RE | Admit: 2013-10-05 | Discharge: 2013-10-05 | Disposition: A | Discharge status: Home / Self Care | Payer: Medicare Other | Source: Ambulatory Visit | Attending: Family Medicine | Admitting: Family Medicine

## 2013-10-05 ENCOUNTER — Other Ambulatory Visit: Payer: Self-pay | Admitting: Family Medicine

## 2013-10-05 DIAGNOSIS — S20229A Contusion of unspecified back wall of thorax, initial encounter: Secondary | ICD-10-CM

## 2013-10-31 ENCOUNTER — Ambulatory Visit: Payer: Medicare Other | Admitting: Critical Care Medicine

## 2013-10-31 ENCOUNTER — Encounter (HOSPITAL_COMMUNITY): Payer: Medicare Other

## 2013-11-26 ENCOUNTER — Ambulatory Visit (HOSPITAL_COMMUNITY)
Admission: RE | Admit: 2013-11-26 | Discharge: 2013-11-26 | Disposition: A | Discharge status: Home / Self Care | Payer: Medicare Other | Source: Ambulatory Visit | Attending: Critical Care Medicine | Admitting: Critical Care Medicine

## 2013-11-26 DIAGNOSIS — J988 Other specified respiratory disorders: Secondary | ICD-10-CM | POA: Insufficient documentation

## 2013-11-26 DIAGNOSIS — J439 Emphysema, unspecified: Secondary | ICD-10-CM

## 2013-11-26 DIAGNOSIS — Z87891 Personal history of nicotine dependence: Secondary | ICD-10-CM | POA: Insufficient documentation

## 2013-11-26 MED ORDER — ALBUTEROL SULFATE (2.5 MG/3ML) 0.083% IN NEBU
2.5000 mg | INHALATION_SOLUTION | Freq: Once | RESPIRATORY_TRACT | Status: AC
  Administered 2013-11-26: 2.5 mg via RESPIRATORY_TRACT

## 2013-11-27 LAB — PULMONARY FUNCTION TEST
DL/VA % pred: 26 %
DL/VA: 1.14 ml/min/mmHg/L
DLCO UNC: 4.42 ml/min/mmHg
DLCO unc % pred: 16 %
FEF 25-75 Post: 0.9 L/sec
FEF 25-75 Pre: 0.63 L/sec
FEF2575-%Change-Post: 42 %
FEF2575-%Pred-Post: 76 %
FEF2575-%Pred-Pre: 53 %
FEV1-%Change-Post: 12 %
FEV1-%PRED-POST: 75 %
FEV1-%Pred-Pre: 67 %
FEV1-Post: 1.51 L
FEV1-Pre: 1.34 L
FEV1FVC-%Change-Post: 6 %
FEV1FVC-%Pred-Pre: 76 %
FEV6-%Change-Post: 6 %
FEV6-%PRED-PRE: 88 %
FEV6-%Pred-Post: 94 %
FEV6-POST: 2.55 L
FEV6-PRE: 2.39 L
FEV6FVC-%CHANGE-POST: 0 %
FEV6FVC-%PRED-POST: 106 %
FEV6FVC-%PRED-PRE: 106 %
FVC-%Change-Post: 5 %
FVC-%PRED-PRE: 84 %
FVC-%Pred-Post: 89 %
FVC-PRE: 2.48 L
FVC-Post: 2.63 L
PRE FEV6/FVC RATIO: 97 %
Post FEV1/FVC ratio: 57 %
Post FEV6/FVC ratio: 97 %
Pre FEV1/FVC ratio: 54 %
RV % PRED: 83 %
RV: 2.15 L
TLC % pred: 73 %
TLC: 4.56 L

## 2013-11-28 ENCOUNTER — Ambulatory Visit (INDEPENDENT_AMBULATORY_CARE_PROVIDER_SITE_OTHER): Payer: Medicare Other | Admitting: Critical Care Medicine

## 2013-11-28 ENCOUNTER — Encounter: Payer: Self-pay | Admitting: Critical Care Medicine

## 2013-11-28 VITALS — BP 134/66 | HR 52 | Ht 67.0 in | Wt 149.0 lb

## 2013-11-28 DIAGNOSIS — Z23 Encounter for immunization: Secondary | ICD-10-CM

## 2013-11-28 DIAGNOSIS — J438 Other emphysema: Secondary | ICD-10-CM

## 2013-11-28 DIAGNOSIS — J439 Emphysema, unspecified: Secondary | ICD-10-CM

## 2013-11-28 DIAGNOSIS — Z66 Do not resuscitate: Secondary | ICD-10-CM

## 2013-11-28 NOTE — Progress Notes (Signed)
Subjective:    Patient ID: Jim Contreras, male    DOB: 29-Aug-1923, 80 y.o.   MRN: [id number]  HPI  80 y.o.  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   11/28/2013 Chief Complaint  Patient presents with  . Follow-up    PFTs done on 6/15.  Daughter feels DOE has worsened.  Has nonprod cough.  No chest tightness/pain.  o2 sat 85% RA rest.  Gradual decline noted The patient's had frequent falls. The patient has not been compliant with oxygen. Level of dyspnea is gradually worsening. Lives a [facility]  This patient continues to use and benefit from her oxygen therapy and has re qualified at this visit for continued oxygen therapy   Review of Systems  Constitutional:   No  weight loss, night sweats,  Fevers, chills, fatigue, lassitude. HEENT:   No headaches,  Difficulty swallowing,  Tooth/dental problems,  Sore throat,                No sneezing, itching, ear ache, nasal congestion, post nasal drip,   CV:  No chest pain,  Orthopnea, PND, swelling in lower extremities, anasarca, dizziness, palpitations  GI  No heartburn, indigestion, abdominal pain, nausea, vomiting, diarrhea, change in bowel habits, loss of appetite  Resp: No shortness of breath with exertion or at rest.  No excess mucus, no productive cough,  No non-productive cough,  No coughing up of blood.  No change in color of mucus.  No wheezing.  No chest wall deformity  Skin: no rash or lesions.  GU: no dysuria, change in color of urine, no urgency or frequency.  No flank pain.  MS:  No joint pain or swelling.  No decreased range of motion.  No back pain.  Psych:  No change in mood or affect. No depression or anxiety.  No memory loss.     Objective:   Physical Exam  Filed Vitals:   11/28/13 0951 11/28/13 0953  BP:  134/66  Pulse: 57 52  Height:  5\' 7"  (1.702 m)  Weight:  149 lb (67.586  kg)  SpO2: 85% 93%    Gen: Pleasant, well-nourished, in no distress,  normal affect  ENT: No lesions,  mouth clear,  oropharynx clear, no postnasal drip  Neck: No JVD, no TMG, no carotid bruits  Lungs: No use of accessory muscles, no dullness to percussion, distant BS, dry rales  Cardiovascular: RRR, heart sounds normal, no murmur or gallops, no peripheral edema  Abdomen: soft and NT, no HSM,  BS normal  Musculoskeletal: No deformities, no cyanosis or clubbing  Neuro: alert, non focal  Skin: Warm, no lesions or rashes  No results found.   PFT 07/12/12:  Fev1 84%  TLC 101%  DLCO 61% PFT 11/10/12: FeV1 87% FeV1/FVC 86%  TLC 84%    DLCO 37%      Assessment & Plan:   COPD with emphysema Gold C  Gold stage C. COPD with primary emphysematous component Chronic respiratory failure Interstitial lung disease secondary to chronic lung disease from COPD Long discussion was held with the patient and family regarding end-of-life issues. The patient does agree to DO NOT RESUSCITATE status. The most form was given to the patient and family for their consideration and they will get back to us on this  maintain inhaled medications as prescribed Maintain oxygen as prescribed    Updated Medication List Outpatient Encounter  Prescriptions as of 11/28/2013  Medication Sig  . amLODipine (NORVASC) 5 MG tablet Take 5 mg by mouth daily.   Marland Kitchen. aspirin 81 MG tablet Take 81 mg by mouth daily.  Marland Kitchen. dutasteride (AVODART) 0.5 MG capsule Take 0.5 mg by mouth daily.  . lansoprazole (PREVACID) 30 MG capsule Take 30 mg by mouth daily at 12 noon.  Marland Kitchen. losartan (COZAAR) 50 MG tablet Take 50 mg by mouth daily.    . pravastatin (PRAVACHOL) 40 MG tablet Take 40 mg by mouth daily.  . Tamsulosin HCl (FLOMAX) 0.4 MG CAPS Take 0.4 mg by mouth daily.  Marland Kitchen. tiotropium (SPIRIVA HANDIHALER) 18 MCG inhalation capsule Place 1 capsule (18 mcg total) into inhaler and inhale daily.  . [DISCONTINUED] acetaminophen (TYLENOL) 325  MG tablet Take 650 mg by mouth every 4 (four) hours as needed for mild pain.  . [DISCONTINUED] budesonide (ENTOCORT EC) 3 MG 24 hr capsule Take 3 capsules (9 mg total) by mouth daily.  . [DISCONTINUED] loperamide (IMODIUM) 2 MG capsule Take 2-4 mg by mouth as needed for diarrhea or loose stools.

## 2013-11-28 NOTE — Patient Instructions (Addendum)
No change in medications Use oxygen all the time at 2Liters Return 6 months Prevnar 13 was given

## 2013-11-29 ENCOUNTER — Telehealth: Payer: Self-pay | Admitting: Critical Care Medicine

## 2013-11-29 DIAGNOSIS — Z66 Do not resuscitate: Secondary | ICD-10-CM | POA: Insufficient documentation

## 2013-11-29 NOTE — Assessment & Plan Note (Signed)
Gold stage C. COPD with primary emphysematous component Chronic respiratory failure Interstitial lung disease secondary to chronic lung disease from COPD Long discussion was held with the patient and family regarding end-of-life issues. The patient does agree to DO NOT RESUSCITATE status. The most form was given to the patient and family for their consideration and they will get back to us on this  maintain inhaled medications as prescribed Maintain oxygen as prescribed

## 2013-11-30 NOTE — Telephone Encounter (Signed)
lmomtcb for [employer].  I have the Pink MOST form she left.  It is completed and has PW's signature on it.  Where they wanting us to make a copy for chart because they should keep the original. Also, will need to verify DNR msg - Do they need to pick up 2 completed yellow DNR forms?

## 2013-11-30 NOTE — Telephone Encounter (Signed)
Noted and I will sign /process forms next week.  pwright

## 2013-11-30 NOTE — Telephone Encounter (Signed)
Will forward to CJ.  

## 2013-11-30 NOTE — Telephone Encounter (Signed)
Spoke with pt's daughter - 1.  Assisted Living where pt resides requires 3 original DNR forms bc there are various places he can be located.  She has 1 original and is requesting to have 2 more completed and placed at front for pick up.  Advised I would have PW complete and would call her once done.  She is aware it will probably be Wednesday of next wk before they are ready as PW is not in office.  She is ok with this. 2.  Advised I have made copy of Pink MOST form and placed copy in scan to be put in pt's chart.  She is aware the original Pink MOST form will be left at front with the DNR forms next wk. Will route to PW as FYI.

## 2013-12-04 NOTE — Telephone Encounter (Signed)
Original DNR forms signed by Dr. Delford FieldWright. These will be placed at front [facility] along with the MOST form as I am at [facility] today. lmomtcb for daughter to inform her of above

## 2013-12-05 NOTE — Telephone Encounter (Signed)
Forms at front at [facility] for pick up. Pt's daughter aware and reports her husband will pick these up tomorrow. She voiced no further questions or concerns at this tim.

## 2014-03-21 ENCOUNTER — Inpatient Hospital Stay (HOSPITAL_COMMUNITY)
Admission: EM | Admit: 2014-03-21 | Discharge: 2014-04-05 | DRG: 193 | Disposition: A | Discharge status: Skilled Nursing Facility | Payer: Medicare Other | Attending: Internal Medicine | Admitting: Internal Medicine

## 2014-03-21 ENCOUNTER — Emergency Department (HOSPITAL_COMMUNITY): Payer: Medicare Other

## 2014-03-21 ENCOUNTER — Encounter (HOSPITAL_COMMUNITY): Payer: Self-pay | Admitting: Emergency Medicine

## 2014-03-21 DIAGNOSIS — E780 Pure hypercholesterolemia: Secondary | ICD-10-CM | POA: Diagnosis present

## 2014-03-21 DIAGNOSIS — J9621 Acute and chronic respiratory failure with hypoxia: Secondary | ICD-10-CM

## 2014-03-21 DIAGNOSIS — Z882 Allergy status to sulfonamides status: Secondary | ICD-10-CM | POA: Diagnosis not present

## 2014-03-21 DIAGNOSIS — K5289 Other specified noninfective gastroenteritis and colitis: Secondary | ICD-10-CM | POA: Diagnosis present

## 2014-03-21 DIAGNOSIS — Z9981 Dependence on supplemental oxygen: Secondary | ICD-10-CM | POA: Diagnosis not present

## 2014-03-21 DIAGNOSIS — I350 Nonrheumatic aortic (valve) stenosis: Secondary | ICD-10-CM

## 2014-03-21 DIAGNOSIS — R0600 Dyspnea, unspecified: Secondary | ICD-10-CM

## 2014-03-21 DIAGNOSIS — Z87891 Personal history of nicotine dependence: Secondary | ICD-10-CM

## 2014-03-21 DIAGNOSIS — N400 Enlarged prostate without lower urinary tract symptoms: Secondary | ICD-10-CM | POA: Diagnosis present

## 2014-03-21 DIAGNOSIS — J441 Chronic obstructive pulmonary disease with (acute) exacerbation: Secondary | ICD-10-CM

## 2014-03-21 DIAGNOSIS — Z889 Allergy status to unspecified drugs, medicaments and biological substances status: Secondary | ICD-10-CM

## 2014-03-21 DIAGNOSIS — I5032 Chronic diastolic (congestive) heart failure: Secondary | ICD-10-CM | POA: Diagnosis present

## 2014-03-21 DIAGNOSIS — J439 Emphysema, unspecified: Secondary | ICD-10-CM

## 2014-03-21 DIAGNOSIS — Z8673 Personal history of transient ischemic attack (TIA), and cerebral infarction without residual deficits: Secondary | ICD-10-CM | POA: Diagnosis not present

## 2014-03-21 DIAGNOSIS — J449 Chronic obstructive pulmonary disease, unspecified: Secondary | ICD-10-CM

## 2014-03-21 DIAGNOSIS — K219 Gastro-esophageal reflux disease without esophagitis: Secondary | ICD-10-CM | POA: Diagnosis present

## 2014-03-21 DIAGNOSIS — E876 Hypokalemia: Secondary | ICD-10-CM | POA: Diagnosis not present

## 2014-03-21 DIAGNOSIS — J440 Chronic obstructive pulmonary disease with acute lower respiratory infection: Secondary | ICD-10-CM | POA: Diagnosis present

## 2014-03-21 DIAGNOSIS — Z66 Do not resuscitate: Secondary | ICD-10-CM | POA: Diagnosis present

## 2014-03-21 DIAGNOSIS — J189 Pneumonia, unspecified organism: Secondary | ICD-10-CM | POA: Diagnosis present

## 2014-03-21 DIAGNOSIS — G9340 Encephalopathy, unspecified: Secondary | ICD-10-CM | POA: Diagnosis present

## 2014-03-21 DIAGNOSIS — W19XXXA Unspecified fall, initial encounter: Secondary | ICD-10-CM

## 2014-03-21 DIAGNOSIS — Z880 Allergy status to penicillin: Secondary | ICD-10-CM | POA: Diagnosis not present

## 2014-03-21 DIAGNOSIS — Z7982 Long term (current) use of aspirin: Secondary | ICD-10-CM | POA: Diagnosis not present

## 2014-03-21 DIAGNOSIS — R001 Bradycardia, unspecified: Secondary | ICD-10-CM | POA: Diagnosis not present

## 2014-03-21 DIAGNOSIS — J9611 Chronic respiratory failure with hypoxia: Secondary | ICD-10-CM

## 2014-03-21 DIAGNOSIS — K52831 Collagenous colitis: Secondary | ICD-10-CM | POA: Diagnosis present

## 2014-03-21 DIAGNOSIS — J431 Panlobular emphysema: Secondary | ICD-10-CM

## 2014-03-21 DIAGNOSIS — I272 Other secondary pulmonary hypertension: Secondary | ICD-10-CM | POA: Diagnosis present

## 2014-03-21 DIAGNOSIS — R0602 Shortness of breath: Secondary | ICD-10-CM

## 2014-03-21 DIAGNOSIS — R1313 Dysphagia, pharyngeal phase: Secondary | ICD-10-CM | POA: Diagnosis present

## 2014-03-21 DIAGNOSIS — W06XXXA Fall from bed, initial encounter: Secondary | ICD-10-CM | POA: Diagnosis present

## 2014-03-21 DIAGNOSIS — Y95 Nosocomial condition: Secondary | ICD-10-CM | POA: Diagnosis present

## 2014-03-21 DIAGNOSIS — J432 Centrilobular emphysema: Secondary | ICD-10-CM

## 2014-03-21 DIAGNOSIS — R509 Fever, unspecified: Secondary | ICD-10-CM | POA: Diagnosis present

## 2014-03-21 DIAGNOSIS — J849 Interstitial pulmonary disease, unspecified: Secondary | ICD-10-CM

## 2014-03-21 DIAGNOSIS — Z515 Encounter for palliative care: Secondary | ICD-10-CM

## 2014-03-21 DIAGNOSIS — I10 Essential (primary) hypertension: Secondary | ICD-10-CM | POA: Diagnosis present

## 2014-03-21 LAB — I-STAT ARTERIAL BLOOD GAS, ED
Bicarbonate: 21.9 mEq/L (ref 20.0–24.0)
O2 Saturation: 89 %
PCO2 ART: 28.6 mmHg — AB (ref 35.0–45.0)
PH ART: 7.491 — AB (ref 7.350–7.450)
Patient temperature: 98.6
TCO2: 23 mmol/L (ref 0–100)
pO2, Arterial: 51 mmHg — ABNORMAL LOW (ref 80.0–100.0)

## 2014-03-21 LAB — CBC WITH DIFFERENTIAL/PLATELET
BASOS ABS: 0 10*3/uL (ref 0.0–0.1)
Basophils Relative: 0 % (ref 0–1)
EOS PCT: 0 % (ref 0–5)
Eosinophils Absolute: 0 10*3/uL (ref 0.0–0.7)
HCT: 40.9 % (ref 39.0–52.0)
Hemoglobin: 13.2 g/dL (ref 13.0–17.0)
LYMPHS ABS: 0.8 10*3/uL (ref 0.7–4.0)
LYMPHS PCT: 5 % — AB (ref 12–46)
MCH: 25 pg — ABNORMAL LOW (ref 26.0–34.0)
MCHC: 32.3 g/dL (ref 30.0–36.0)
MCV: 77.6 fL — AB (ref 78.0–100.0)
Monocytes Absolute: 1.5 10*3/uL — ABNORMAL HIGH (ref 0.1–1.0)
Monocytes Relative: 10 % (ref 3–12)
NEUTROS ABS: 13.2 10*3/uL — AB (ref 1.7–7.7)
Neutrophils Relative %: 85 % — ABNORMAL HIGH (ref 43–77)
Platelets: 195 10*3/uL (ref 150–400)
RBC: 5.27 MIL/uL (ref 4.22–5.81)
RDW: 17 % — AB (ref 11.5–15.5)
WBC: 15.6 10*3/uL — AB (ref 4.0–10.5)

## 2014-03-21 LAB — COMPREHENSIVE METABOLIC PANEL
ALT: 9 U/L (ref 0–53)
AST: 17 U/L (ref 0–37)
Albumin: 3.4 g/dL — ABNORMAL LOW (ref 3.5–5.2)
Alkaline Phosphatase: 61 U/L (ref 39–117)
Anion gap: 12 (ref 5–15)
BILIRUBIN TOTAL: 0.7 mg/dL (ref 0.3–1.2)
BUN: 19 mg/dL (ref 6–23)
CALCIUM: 9.2 mg/dL (ref 8.4–10.5)
CHLORIDE: 103 meq/L (ref 96–112)
CO2: 22 meq/L (ref 19–32)
Creatinine, Ser: 1.15 mg/dL (ref 0.50–1.35)
GFR, EST AFRICAN AMERICAN: 64 mL/min — AB (ref >90)
GFR, EST NON AFRICAN AMERICAN: 55 mL/min — AB (ref >90)
GLUCOSE: 119 mg/dL — AB (ref 70–99)
Potassium: 4 mEq/L (ref 3.7–5.3)
Sodium: 137 mEq/L (ref 137–147)
Total Protein: 7.4 g/dL (ref 6.0–8.3)

## 2014-03-21 LAB — I-STAT CG4 LACTIC ACID, ED
Lactic Acid, Venous: 1.45 mmol/L (ref 0.5–2.2)
Lactic Acid, Venous: 0.58 mmol/L (ref 0.5–2.2)

## 2014-03-21 MED ORDER — ASPIRIN EC 81 MG PO TBEC
81.0000 mg | DELAYED_RELEASE_TABLET | Freq: Every day | ORAL | Status: DC
  Administered 2014-03-22 – 2014-04-05 (×15): 81 mg via ORAL
  Filled 2014-03-21 (×15): qty 1

## 2014-03-21 MED ORDER — GUAIFENESIN ER 600 MG PO TB12
600.0000 mg | ORAL_TABLET | Freq: Two times a day (BID) | ORAL | Status: DC
  Administered 2014-03-21 – 2014-04-05 (×29): 600 mg via ORAL
  Filled 2014-03-21 (×34): qty 1

## 2014-03-21 MED ORDER — METHYLPREDNISOLONE SODIUM SUCC 125 MG IJ SOLR
60.0000 mg | Freq: Four times a day (QID) | INTRAMUSCULAR | Status: DC
  Administered 2014-03-21 – 2014-03-24 (×11): 60 mg via INTRAVENOUS
  Filled 2014-03-21 (×13): qty 0.96
  Filled 2014-03-21: qty 2
  Filled 2014-03-21: qty 0.96

## 2014-03-21 MED ORDER — DUTASTERIDE 0.5 MG PO CAPS
0.5000 mg | ORAL_CAPSULE | Freq: Every day | ORAL | Status: DC
  Administered 2014-03-22 – 2014-04-05 (×15): 0.5 mg via ORAL
  Filled 2014-03-21 (×15): qty 1

## 2014-03-21 MED ORDER — PRAVASTATIN SODIUM 40 MG PO TABS
40.0000 mg | ORAL_TABLET | Freq: Every day | ORAL | Status: DC
  Administered 2014-03-21 – 2014-04-05 (×16): 40 mg via ORAL
  Filled 2014-03-21 (×16): qty 1

## 2014-03-21 MED ORDER — ASPIRIN 81 MG PO TABS: 81.0000 mg | ORAL_TABLET | Freq: Every day | ORAL | Status: DC

## 2014-03-21 MED ORDER — SODIUM CHLORIDE 0.9 % IV SOLN
1000.0000 mL | INTRAVENOUS | Status: DC
  Administered 2014-03-21: 1000 mL via INTRAVENOUS

## 2014-03-21 MED ORDER — TAMSULOSIN HCL 0.4 MG PO CAPS
0.4000 mg | ORAL_CAPSULE | Freq: Every day | ORAL | Status: DC
  Administered 2014-03-22 – 2014-04-05 (×15): 0.4 mg via ORAL
  Filled 2014-03-21 (×15): qty 1

## 2014-03-21 MED ORDER — IPRATROPIUM-ALBUTEROL 0.5-2.5 (3) MG/3ML IN SOLN
3.0000 mL | Freq: Once | RESPIRATORY_TRACT | Status: AC
  Administered 2014-03-21: 3 mL via RESPIRATORY_TRACT
  Filled 2014-03-21: qty 3

## 2014-03-21 MED ORDER — DEXTROSE 5 % IV SOLN
2.0000 g | Freq: Once | INTRAVENOUS | Status: AC
  Administered 2014-03-21: 2 g via INTRAVENOUS
  Filled 2014-03-21: qty 2

## 2014-03-21 MED ORDER — VANCOMYCIN HCL IN DEXTROSE 1-5 GM/200ML-% IV SOLN
1000.0000 mg | INTRAVENOUS | Status: DC
  Administered 2014-03-22 – 2014-03-23 (×2): 1000 mg via INTRAVENOUS
  Filled 2014-03-21 (×3): qty 200

## 2014-03-21 MED ORDER — SODIUM CHLORIDE 0.9 % IV BOLUS (SEPSIS)
30.0000 mL/kg | Freq: Once | INTRAVENOUS | Status: AC
  Administered 2014-03-21: 1905 mL via INTRAVENOUS

## 2014-03-21 MED ORDER — ENOXAPARIN SODIUM 40 MG/0.4ML ~~LOC~~ SOLN
40.0000 mg | SUBCUTANEOUS | Status: DC
  Administered 2014-03-22 – 2014-03-26 (×5): 40 mg via SUBCUTANEOUS
  Filled 2014-03-21 (×5): qty 0.4

## 2014-03-21 MED ORDER — LOSARTAN POTASSIUM 50 MG PO TABS
50.0000 mg | ORAL_TABLET | Freq: Every day | ORAL | Status: DC
  Administered 2014-03-22 – 2014-04-03 (×12): 50 mg via ORAL
  Filled 2014-03-21 (×14): qty 1

## 2014-03-21 MED ORDER — ACETAMINOPHEN 325 MG PO TABS
650.0000 mg | ORAL_TABLET | Freq: Once | ORAL | Status: AC
  Administered 2014-03-21: 650 mg via ORAL
  Filled 2014-03-21: qty 2

## 2014-03-21 MED ORDER — PANTOPRAZOLE SODIUM 40 MG PO TBEC
40.0000 mg | DELAYED_RELEASE_TABLET | Freq: Every day | ORAL | Status: DC
  Administered 2014-03-21 – 2014-04-05 (×16): 40 mg via ORAL
  Filled 2014-03-21 (×15): qty 1

## 2014-03-21 MED ORDER — DEXTROSE 5 % IV SOLN
1.0000 g | Freq: Three times a day (TID) | INTRAVENOUS | Status: DC
  Administered 2014-03-22 – 2014-03-24 (×8): 1 g via INTRAVENOUS
  Filled 2014-03-21 (×11): qty 1

## 2014-03-21 MED ORDER — VANCOMYCIN HCL IN DEXTROSE 1-5 GM/200ML-% IV SOLN
1000.0000 mg | Freq: Once | INTRAVENOUS | Status: AC
  Administered 2014-03-21: 1000 mg via INTRAVENOUS
  Filled 2014-03-21: qty 200

## 2014-03-21 MED ORDER — IPRATROPIUM-ALBUTEROL 0.5-2.5 (3) MG/3ML IN SOLN
3.0000 mL | RESPIRATORY_TRACT | Status: DC
  Administered 2014-03-21 – 2014-03-22 (×4): 3 mL via RESPIRATORY_TRACT
  Filled 2014-03-21 (×4): qty 3

## 2014-03-21 MED ORDER — SODIUM CHLORIDE 0.9 % IV SOLN
INTRAVENOUS | Status: DC
  Administered 2014-03-21: 1000 mL via INTRAVENOUS
  Administered 2014-03-22: 17:00:00 via INTRAVENOUS

## 2014-03-21 NOTE — Progress Notes (Signed)
ANTIBIOTIC CONSULT NOTE - INITIAL  Pharmacy Consult for Aztreonam/Vancomycin Indication: pneumonia  Allergies  Allergen Reactions  . Floxin [Ofloxacin] Other (See Comments)    Lost feeling in feet  . Penicillins Other (See Comments)    Ulcers in mouth  . Sulfa Antibiotics Other (See Comments)    Ulcers in mouth    Patient Measurements: Height: 5\' 7"  (170.2 cm) Weight: 140 lb (63.504 kg) IBW/kg (Calculated) : 66.1  Vital Signs: Temp: 101.5 F (38.6 C) (10/08 1707) Temp Source: Oral (10/08 1707) BP: 157/66 mmHg (10/08 1707) Pulse Rate: 92 (10/08 1707)  Labs: No results found for this basename: WBC, HGB, PLT, LABCREA, CREATININE,  in the last 72 hours Estimated Creatinine Clearance: 57.3 ml/min (by C-G formula based on Cr of 0.79). No results found for this basename: VANCOTROUGH, VANCOPEAK, VANCORANDOM, GENTTROUGH, GENTPEAK, GENTRANDOM, TOBRATROUGH, TOBRAPEAK, TOBRARND, AMIKACINPEAK, AMIKACINTROU, AMIKACIN,  in the last 72 hours   Microbiology: No results found for this or any previous visit (from the past 720 hour(s)).  Medical History: Past Medical History  Diagnosis Date  . Hypertension   . Hypercholesteremia   . BPH (benign prostatic hyperplasia)   . ILD (interstitial lung disease)   . COPD, moderate   . Stroke     Thalamic   Assessment: 80 y/o M presents from [facility] with confusion, fever, and two falls today. Pt has penicillin allergy. CXR suspicious for infection and code sepsis called. Temp 101.5, BP 157/66, WBC 15.6. CrCl 39.9 mL/min.  Goal of Therapy:  Vancomycin trough 15-20.  Plan:  Aztreonam IV 1g q8h (1 dose given in ED) Vancomycin IV 1000 mg q24h (1 dose given in ED) F/u renal function and vancomycin trough  Marisue BrooklynGazda, Nicholas 03/21/2014,5:29 PM  Agree with the above.   Louie CasaJennifer Cydnee Fuquay, PharmD, BCPS 03/21/2014, 6:38 PM

## 2014-03-21 NOTE — H&P (Addendum)
[facility] History and Physical  Patient: Jim Contreras  ZOX:[id number]  DOB: [birth year]  DOS: the patient was seen and examined on 03/21/2014 PCP: [name], MD  Chief Complaint: shortness of breath  HPI: Jim Contreras is a 80 y.o. male with Past medical history of hypertension, COPD with chronic resp failure on chronic o2 3LPM, CVA, interstitial lung disease, BPH, diastolic dysfunction, pulmonary hypertension. The patient is presenting with complaints of shortness of breath ongoing with last 1 week progressively worsening along with cough with expectoration. He also had fever and chills. He denies any chest pain. He denies any choking episode. He is from an assisted living facility. No recent travel reported. No leg swelling or leg tenderness. No palpitation dizziness lightheadedness nausea vomiting abdominal pain diarrhea constipation burning urination. Medication has been notified based on the documentation available from the nursing home and no recent change identified. Initially the history was reported that the patient had a fall twice on my discussion with the nursing home staff today while the patient was coming out of the bed when his wheelchair was not locked, He fell down on the ground, no head injury or neck injury reported. Also since last 2 days the patient has not been acting himself as per the nursing home staff.  The patient is coming from SNF. And at his baseline independent for most of his ADL.  Review of Systems: as mentioned in the history of present illness.  A Comprehensive review of the other systems is negative.  Past Medical History  Diagnosis Date  . Hypertension   . Hypercholesteremia   . BPH (benign prostatic hyperplasia)   . ILD (interstitial lung disease)   . COPD, moderate   . Stroke     Thalamic   Past Surgical History  Procedure Laterality Date  . Cataract extraction Bilateral   . Appendectomy    . Hernia repair    . Flexible  sigmoidoscopy N/A 04/24/2013    Procedure: FLEXIBLE SIGMOIDOSCOPY;  Surgeon: Shirley Friar, MD;  Location[facility] END[facility]COPY;  Service: Endoscopy;  Laterality: N/A;   Social History:  reports that he quit smoking about 42 years ago. His smoking use included Cigarettes. He has a 40 pack-year smoking history. He has never used smokeless tobacco. He reports that he drinks alcohol. He reports that he does not use illicit drugs.  Allergies  Allergen Reactions  . Floxin [Ofloxacin] Other (See Comments)    Lost feeling in feet  . Penicillins Other (See Comments)    Ulcers in mouth  . Sulfa Antibiotics Other (See Comments)    Ulcers in mouth    No family history on file.  Prior to Admission medications   Medication Sig Start Date End Date Taking? Authorizing Provider  amLODipine (NORVASC) 5 MG tablet Take 5 mg by mouth daily.  04/10/12  Yes Historical Provider, MD  aspirin 81 MG tablet Take 81 mg by mouth daily.   Yes Historical Provider, MD  dutasteride (AVODART) 0.5 MG capsule Take 0.5 mg by mouth daily.   Yes Historical Provider, MD  lansoprazole (PREVACID) 30 MG capsule Take 30 mg by mouth daily at 12 noon.   Yes Historical Provider, MD  losartan (COZAAR) 50 MG tablet Take 50 mg by mouth daily.     Yes Historical Provider, MD  pravastatin (PRAVACHOL) 40 MG tablet Take 40 mg by mouth daily.   Yes Historical Provider, MD  Tamsulosin HCl (FLOMAX) 0.4 MG CAPS Take 0.4 mg by mouth daily.  Yes Historical Provider, MD  tiotropium (SPIRIVA HANDIHALER) 18 MCG inhalation capsule Place 1 capsule (18 mcg total) into inhaler and inhale daily. 04/02/13 04/02/14 Yes Storm FriskPatrick E Wright, MD    Physical Exam: Filed Vitals:   03/21/14 1705 03/21/14 1706 03/21/14 1707 03/21/14 1844  BP:   157/66   Pulse:   92   Temp:   101.5 F (38.6 C)   TempSrc:   Oral   Resp:   25   Height: 5\' 7"  (1.702 m)     Weight: 63.504 kg (140 lb)     SpO2:  88% 89% 92%    General: Alert, Awake and Oriented to Time, Place  and Person. Appear in moderate distress Eyes: PERRL ENT: Oral Mucosa has greenish mucus, dry. Neck: no JVD Cardiovascular: S1 and S2 Present, aortic systolic  Murmur, Peripheral Pulses Present Respiratory: Bilateral Air entry equal and Decreased, bilateral basal Crackles, extensive expiratory  wheezes Abdomen: Bowel Sound present , Soft and no  tender Skin: no Rash Extremities: no Pedal edema, no calf tenderness Neurologic: Grossly no focal neuro deficit.other than generalized weakness   Labs on Admission:  CBC:  Recent Labs Lab 03/21/14 1725  WBC 15.6*  NEUTROABS 13.2*  HGB 13.2  HCT 40.9  MCV 77.6*  PLT 195    CMP     Component Value Date/Time   NA 137 03/21/2014 1725   K 4.0 03/21/2014 1725   CL 103 03/21/2014 1725   CO2 22 03/21/2014 1725   GLUCOSE 119* 03/21/2014 1725   BUN 19 03/21/2014 1725   CREATININE 1.15 03/21/2014 1725   CALCIUM 9.2 03/21/2014 1725   PROT 7.4 03/21/2014 1725   ALBUMIN 3.4* 03/21/2014 1725   AST 17 03/21/2014 1725   ALT 9 03/21/2014 1725   ALKPHOS 61 03/21/2014 1725   BILITOT 0.7 03/21/2014 1725   GFRNONAA 55* 03/21/2014 1725   GFRAA 64* 03/21/2014 1725    No results found for this basename: LIPASE, AMYLASE,  in the last 168 hours No results found for this basename: AMMONIA,  in the last 168 hours  No results found for this basename: CKTOTAL, CKMB, CKMBINDEX, TROPONINI,  in the last 168 hours BNP (last 3 results) No results found for this basename: PROBNP,  in the last 8760 hours  Radiological Exams on Admission: Ct Head Wo Contrast  03/21/2014   CLINICAL DATA:  Altered mental status after fall.  EXAM: CT HEAD WITHOUT CONTRAST  TECHNIQUE: Contiguous axial images were obtained from the base of the skull through the vertex without intravenous contrast.  COMPARISON:  CT scan of December 31, 2012.  FINDINGS: Bony calvarium appears intact. Diffuse cortical atrophy is noted. Mild chronic ischemic white matter disease is noted. No mass effect or midline shift  is noted. Ventricular size is within normal limits. There is no evidence of mass lesion, hemorrhage or acute infarction.  IMPRESSION: Diffuse cortical atrophy. Mild chronic ischemic white matter disease. No acute intracranial abnormality seen.   Electronically Signed   By: Roque LiasJames  Green M.D.   On: 03/21/2014 18:26   Dg Chest Port 1 View  03/21/2014   CLINICAL DATA:  Confusion fever.  Hypertension.  COPD.  Cough.  EXAM: PORTABLE CHEST - 1 VIEW  COMPARISON:  12/31/2012.  03/09/2012  FINDINGS: Midline trachea. Mild cardiomegaly with atherosclerosis in the transverse aorta. No right and no definite left pleural effusion. Lower lobe predominant interstitial thickening is moderate. Right upper lobe scarring. More confluent left lower lobe patchy airspace disease.  IMPRESSION: Left  lower lobe airspace disease, suspicious for infection or aspiration. Progression of interstitial lung disease is felt less likely.  Interstitial lung disease as was detailed on 05/01/2013 high-resolution CT. Considerations include alveolar microlithiasis superimposed upon nonspecific interstitial pneumonitis.  Cardiomegaly without congestive failure.   Electronically Signed   By: Jeronimo Greaves M.D.   On: 03/21/2014 17:58    EKG: Independently reviewed. normal sinus rhythm, sinus tachycardia.  Assessment/Plan Principal Problem:   Acute on chronic respiratory failure with hypoxemia Active Problems:   COPD with emphysema Gold C    ILD (interstitial lung disease)   Pulmonary hypertension   Hypertension   Collagenous colitis   Pneumonia   Accidental fall from bed   Acute encephalopathy   1. Acute on chronic respiratory failure with hypoxemia Healthcare associated pneumonia  COPD exacerbation  Interstitial lung disease with pulmonary hypertension  The patient presented with complaints of shortness of breath, cough; he has expiratory wheezing, respiratory distress, tachypnea, with use of accessory muscles of respiration,  hypoxia but no acute hypercarbia with respiratory acidosis. The patient has evidence of acute on chronic, hypoxic respiratory failure. pO2 <60 mm Hg or SpO2 (pulse oximetry) <91% breathing room air  Patient will be admitted for suspected healthcare associated pneumonia and COPD exacerbation secondary to pneumonia. I will get sputum culture, urine antigens, influenza PCR. I will treat the patient with IV Solu-Medrol 60 mg Q 12hours, DUONEB every 4 hours, oxygen as needed, IV antibiotics vancomycin and aztreonam based on patient's allergy. Mucinex as needed. BiPAP when necessary  2. history of chronic diastolic dysfunction Hypertension  Continue ARB, holding amlodipine at present.  3. BPH Continue Avodart and Flomax  4.GERD   continue Protonix  5. Acute encephalopathy Fall from bed The patient had a fall earlier today which was thought to be mechanical, CT of the head is negative for any acute abnormality and the patient appears well oriented after initial treatment for his respiratory failure. Probable etiology of his confusion i ongoing infection with worsening hypoxi. Continue to monitor .  Advance goals of care discussion: DNR/DNI document available from the nursing home.   DVT Prophylaxis: subcutaneous Heparin Nutrition: Advance as tolerated cardiac diet   Disposition: Admitted to inpatient in step-down unit.  Author: [name], MD [facility] Pager: [phone] 03/21/2014, 8:03 PM    If 7PM-7AM, please contact night-coverage [email or web address] Password [email or web address]

## 2014-03-21 NOTE — ED Provider Notes (Signed)
CSN: [id number]     Arrival date & time 03/21/14  1656 History   First MD Initiated Contact with Patient 03/21/14 1657     Chief Complaint  Patient presents with  . Fever     (Consider location/radiation/quality/duration/timing/severity/associated sxs/prior Treatment) HPI 80 year old male presents with shortness of breath, productive cough, and confusion. History taken from his daughter. She last saw him 3 days ago and he is starting to have a worse cough than typical. He is typically on 2 L oxygen for his COPD. Shortness breath is also worsened. She's been calling him but he has not been answering. He lives is in assisted living. He has fallen the last couple days. The patient was noted to have a fever today. He is having a more productive cough than normal. The patient is a DO NOT RESUSCITATE. Patient typically gets confused like this with infections in the past.  Past Medical History  Diagnosis Date  . Hypertension   . Hypercholesteremia   . BPH (benign prostatic hyperplasia)   . ILD (interstitial lung disease)   . COPD, moderate   . Stroke     Thalamic   Past Surgical History  Procedure Laterality Date  . Cataract extraction Bilateral   . Appendectomy    . Hernia repair    . Flexible sigmoidoscopy N/A 04/24/2013    Procedure: FLEXIBLE SIGMOIDOSCOPY;  Surgeon: Shirley Friar, MD;  Location: [facility] ENDOSCOPY;  Service: Endoscopy;  Laterality: N/A;   No family history on file. History  Substance Use Topics  . Smoking status: Former Smoker -- 1.00 packs/day for 40 years    Types: Cigarettes    Quit date: 06/15/1971  . Smokeless tobacco: Never Used  . Alcohol Use: Yes     Comment: sparingly    Review of Systems  Unable to perform ROS: Mental status change  Constitutional: Positive for fever.  Respiratory: Positive for cough and shortness of breath.       Allergies  Floxin; Penicillins; and Sulfa antibiotics  Home Medications   Prior to Admission medications    Medication Sig Start Date End Date Taking? Authorizing Provider  amLODipine (NORVASC) 5 MG tablet Take 5 mg by mouth daily.  04/10/12   Historical Provider, MD  aspirin 81 MG tablet Take 81 mg by mouth daily.    Historical Provider, MD  dutasteride (AVODART) 0.5 MG capsule Take 0.5 mg by mouth daily.    Historical Provider, MD  lansoprazole (PREVACID) 30 MG capsule Take 30 mg by mouth daily at 12 noon.    Historical Provider, MD  losartan (COZAAR) 50 MG tablet Take 50 mg by mouth daily.      Historical Provider, MD  pravastatin (PRAVACHOL) 40 MG tablet Take 40 mg by mouth daily.    Historical Provider, MD  Tamsulosin HCl (FLOMAX) 0.4 MG CAPS Take 0.4 mg by mouth daily.    Historical Provider, MD  tiotropium (SPIRIVA HANDIHALER) 18 MCG inhalation capsule Place 1 capsule (18 mcg total) into inhaler and inhale daily. 04/02/13 04/02/14  Storm Frisk, MD   BP 157/66  Pulse 92  Temp(Src) 101.5 F (38.6 C) (Oral)  Resp 25  Ht 5\' 7"  (1.702 m)  Wt 140 lb (63.504 kg)  BMI 21.92 kg/m2  SpO2 89% Physical Exam  Nursing note and vitals reviewed. Constitutional: He is oriented to person, place, and time. He appears well-developed and well-nourished.  HENT:  Head: Normocephalic and atraumatic.  Right Ear: External ear normal.  Left Ear: External ear normal.  Nose: Nose normal.  Eyes: Right eye exhibits no discharge. Left eye exhibits no discharge.  Neck: Neck supple.  Cardiovascular: Normal rate, regular rhythm, normal heart sounds and intact distal pulses.   Pulmonary/Chest: No accessory muscle usage. Tachypnea noted. No respiratory distress. He has decreased breath sounds in the right lower field. He has no wheezes. He has rales in the right lower field.  Abdominal: Soft. There is no tenderness.  Musculoskeletal: He exhibits no edema and no tenderness.  Neurological: He is alert and oriented to person, place, and time.  Skin: Skin is warm and dry.    ED Course  Procedures (including  critical care time) Labs Review Labs Reviewed  CBC WITH DIFFERENTIAL - Abnormal; Notable for the following:    WBC 15.6 (*)    MCV 77.6 (*)    MCH 25.0 (*)    RDW 17.0 (*)    Neutrophils Relative % 85 (*)    Neutro Abs 13.2 (*)    Lymphocytes Relative 5 (*)    Monocytes Absolute 1.5 (*)    All other components within normal limits  COMPREHENSIVE METABOLIC PANEL - Abnormal; Notable for the following:    Glucose, Bld 119 (*)    Albumin 3.4 (*)    GFR calc non Af Amer 55 (*)    GFR calc Af Amer 64 (*)    All other components within normal limits  I-STAT ARTERIAL BLOOD GAS, ED - Abnormal; Notable for the following:    pH, Arterial 7.491 (*)    pCO2 arterial 28.6 (*)    pO2, Arterial 51.0 (*)    All other components within normal limits  CULTURE, BLOOD (ROUTINE X 2)  CULTURE, BLOOD (ROUTINE X 2)  URINE CULTURE  URINALYSIS, ROUTINE W REFLEX MICROSCOPIC  I-STAT CG4 LACTIC ACID, ED    Imaging Review Ct Head Wo Contrast  03/21/2014   CLINICAL DATA:  Altered mental status after fall.  EXAM: CT HEAD WITHOUT CONTRAST  TECHNIQUE: Contiguous axial images were obtained from the base of the skull through the vertex without intravenous contrast.  COMPARISON:  CT scan of December 31, 2012.  FINDINGS: Bony calvarium appears intact. Diffuse cortical atrophy is noted. Mild chronic ischemic white matter disease is noted. No mass effect or midline shift is noted. Ventricular size is within normal limits. There is no evidence of mass lesion, hemorrhage or acute infarction.  IMPRESSION: Diffuse cortical atrophy. Mild chronic ischemic white matter disease. No acute intracranial abnormality seen.   Electronically Signed   By: Roque Lias M.D.   On: 03/21/2014 18:26   Dg Chest Port 1 View  03/21/2014   CLINICAL DATA:  Confusion fever.  Hypertension.  COPD.  Cough.  EXAM: PORTABLE CHEST - 1 VIEW  COMPARISON:  12/31/2012.  03/09/2012  FINDINGS: Midline trachea. Mild cardiomegaly with atherosclerosis in the  transverse aorta. No right and no definite left pleural effusion. Lower lobe predominant interstitial thickening is moderate. Right upper lobe scarring. More confluent left lower lobe patchy airspace disease.  IMPRESSION: Left lower lobe airspace disease, suspicious for infection or aspiration. Progression of interstitial lung disease is felt less likely.  Interstitial lung disease as was detailed on 05/01/2013 high-resolution CT. Considerations include alveolar microlithiasis superimposed upon nonspecific interstitial pneumonitis.  Cardiomegaly without congestive failure.   Electronically Signed   By: Jeronimo Greaves M.D.   On: 03/21/2014 17:58     EKG Interpretation   Date/Time:  Thursday March 21 2014 17:03:19 EDT Ventricular Rate:  97 PR Interval:  227 QRS Duration: 91 QT Interval:  331 QTC Calculation: 420 R Axis:   115 Text Interpretation:  Right and left arm electrode reversal,  interpretation assumes no reversal Sinus rhythm Prolonged PR interval  Anterior infarct, old No significant change since last tracing Confirmed  by Thresia Ramanathan  MD, Tenessa Marsee ([id number]) on 03/21/2014 5:27:36 PM      MDM   Final diagnoses:  HCAP (healthcare-associated pneumonia)    Patient has mild increased work of breathing but is more hypoxic than normal. Placed on higher flow oxygen and given breathing treatment with some relief. X-rays consistent with new pneumonia, we'll cover for H. Given he lives in assisted living. Daughter reiterates that the patient is a DO NOT RESUSCITATE but would like fluids and antibiotics as needed. At this point will admit to the hospitalist in the step down unit.    Audree CamelScott T Anahid Eskelson, MD 03/21/14 2351

## 2014-03-21 NOTE — ED Notes (Signed)
Pt comes via [facility] from [facility] , for increased confusion and fever. Per daughter pt did not eat last night or today and pt fell twice today.

## 2014-03-22 LAB — CBC WITH DIFFERENTIAL/PLATELET
Basophils Absolute: 0 10*3/uL (ref 0.0–0.1)
Basophils Relative: 0 % (ref 0–1)
Eosinophils Absolute: 0 10*3/uL (ref 0.0–0.7)
Eosinophils Relative: 0 % (ref 0–5)
HCT: 39.4 % (ref 39.0–52.0)
HEMOGLOBIN: 12.5 g/dL — AB (ref 13.0–17.0)
Lymphocytes Relative: 3 % — ABNORMAL LOW (ref 12–46)
Lymphs Abs: 0.3 10*3/uL — ABNORMAL LOW (ref 0.7–4.0)
MCH: 25.1 pg — ABNORMAL LOW (ref 26.0–34.0)
MCHC: 31.7 g/dL (ref 30.0–36.0)
MCV: 79 fL (ref 78.0–100.0)
MONO ABS: 0.3 10*3/uL (ref 0.1–1.0)
MONOS PCT: 3 % (ref 3–12)
NEUTROS ABS: 11.6 10*3/uL — AB (ref 1.7–7.7)
Neutrophils Relative %: 94 % — ABNORMAL HIGH (ref 43–77)
Platelets: 171 10*3/uL (ref 150–400)
RBC: 4.99 MIL/uL (ref 4.22–5.81)
RDW: 17.2 % — ABNORMAL HIGH (ref 11.5–15.5)
WBC: 12.2 10*3/uL — AB (ref 4.0–10.5)

## 2014-03-22 LAB — COMPREHENSIVE METABOLIC PANEL
ALBUMIN: 2.7 g/dL — AB (ref 3.5–5.2)
ALT: 8 U/L (ref 0–53)
ANION GAP: 12 (ref 5–15)
AST: 15 U/L (ref 0–37)
Alkaline Phosphatase: 51 U/L (ref 39–117)
BUN: 17 mg/dL (ref 6–23)
CO2: 20 mEq/L (ref 19–32)
CREATININE: 1.13 mg/dL (ref 0.50–1.35)
Calcium: 7.8 mg/dL — ABNORMAL LOW (ref 8.4–10.5)
Chloride: 108 mEq/L (ref 96–112)
GFR calc Af Amer: 65 mL/min — ABNORMAL LOW (ref >90)
GFR calc non Af Amer: 56 mL/min — ABNORMAL LOW (ref >90)
Glucose, Bld: 182 mg/dL — ABNORMAL HIGH (ref 70–99)
Potassium: 3.8 mEq/L (ref 3.7–5.3)
Sodium: 140 mEq/L (ref 137–147)
Total Bilirubin: 0.5 mg/dL (ref 0.3–1.2)
Total Protein: 6.2 g/dL (ref 6.0–8.3)

## 2014-03-22 LAB — INFLUENZA PANEL BY PCR (TYPE A & B)
H1N1 flu by pcr: NOT DETECTED
INFLBPCR: NEGATIVE
Influenza A By PCR: NEGATIVE

## 2014-03-22 LAB — MRSA PCR SCREENING: MRSA BY PCR: NEGATIVE

## 2014-03-22 MED ORDER — IPRATROPIUM-ALBUTEROL 0.5-2.5 (3) MG/3ML IN SOLN
3.0000 mL | Freq: Four times a day (QID) | RESPIRATORY_TRACT | Status: DC
  Administered 2014-03-22 – 2014-03-23 (×6): 3 mL via RESPIRATORY_TRACT
  Filled 2014-03-22 (×6): qty 3

## 2014-03-22 MED ORDER — MENTHOL 3 MG MT LOZG
1.0000 | LOZENGE | OROMUCOSAL | Status: DC | PRN
  Administered 2014-03-22: 3 mg via ORAL
  Filled 2014-03-22: qty 9

## 2014-03-22 MED ORDER — DEXTROSE 5 % IV SOLN
500.0000 mg | INTRAVENOUS | Status: DC
  Administered 2014-03-22: 500 mg via INTRAVENOUS
  Filled 2014-03-22 (×2): qty 500

## 2014-03-22 MED ORDER — ASPIRIN 81 MG PO TABS: 81.0000 mg | ORAL_TABLET | Freq: Every day | ORAL | Status: DC

## 2014-03-22 MED ORDER — AMLODIPINE BESYLATE 5 MG PO TABS
5.0000 mg | ORAL_TABLET | Freq: Every day | ORAL | Status: DC
  Administered 2014-03-22 – 2014-03-25 (×4): 5 mg via ORAL
  Filled 2014-03-22 (×4): qty 1

## 2014-03-22 MED ORDER — CETYLPYRIDINIUM CHLORIDE 0.05 % MT LIQD
7.0000 mL | Freq: Two times a day (BID) | OROMUCOSAL | Status: DC
  Administered 2014-03-22 – 2014-04-05 (×25): 7 mL via OROMUCOSAL

## 2014-03-22 NOTE — Progress Notes (Signed)
Pt. Refuses condom catheter at this time.

## 2014-03-22 NOTE — Progress Notes (Signed)
[facility] F/U note  Patient with COPD Gold Stage C admitted with dyspnea and acute resp failure  Principal Problem:   Acute on chronic respiratory failure with hypoxemia/ HCAP  COPD with emphysema Gold C    ILD  - has seen Dr Delford FieldWright as outpt - on Vanc and Azactam- will add Zithromax - cont steroids, nebs and O2 - PRN BiPAP - follow in SDU  Active Problems:    Hypertension - resume home meds  BPH  Continue Avodart and Flomax   GERD  continue Protonix   Acute encephalopathy  Fall from bed-follow mental status - PT eval in AM  Jim Crunk,MD

## 2014-03-22 NOTE — Progress Notes (Signed)
Utilization review completed.  

## 2014-03-23 DIAGNOSIS — I359 Nonrheumatic aortic valve disorder, unspecified: Secondary | ICD-10-CM

## 2014-03-23 LAB — URINALYSIS, ROUTINE W REFLEX MICROSCOPIC
Bilirubin Urine: NEGATIVE
Glucose, UA: NEGATIVE mg/dL
KETONES UR: NEGATIVE mg/dL
Leukocytes, UA: NEGATIVE
Nitrite: NEGATIVE
PH: 5 (ref 5.0–8.0)
PROTEIN: NEGATIVE mg/dL
Specific Gravity, Urine: 1.013 (ref 1.005–1.030)
Urobilinogen, UA: 0.2 mg/dL (ref 0.0–1.0)

## 2014-03-23 LAB — URINE MICROSCOPIC-ADD ON

## 2014-03-23 LAB — STREP PNEUMONIAE URINARY ANTIGEN: STREP PNEUMO URINARY ANTIGEN: NEGATIVE

## 2014-03-23 MED ORDER — FUROSEMIDE 10 MG/ML IJ SOLN
40.0000 mg | Freq: Once | INTRAMUSCULAR | Status: AC
  Administered 2014-03-23: 40 mg via INTRAVENOUS
  Filled 2014-03-23: qty 4

## 2014-03-23 MED ORDER — IPRATROPIUM-ALBUTEROL 0.5-2.5 (3) MG/3ML IN SOLN
3.0000 mL | Freq: Three times a day (TID) | RESPIRATORY_TRACT | Status: DC
  Administered 2014-03-24 – 2014-04-03 (×28): 3 mL via RESPIRATORY_TRACT
  Filled 2014-03-23 (×31): qty 3

## 2014-03-23 MED ORDER — TRAZODONE HCL 50 MG PO TABS
50.0000 mg | ORAL_TABLET | Freq: Every evening | ORAL | Status: DC | PRN
  Administered 2014-03-23: 50 mg via ORAL
  Filled 2014-03-23: qty 1

## 2014-03-23 MED ORDER — AZITHROMYCIN 500 MG PO TABS
500.0000 mg | ORAL_TABLET | Freq: Every day | ORAL | Status: DC
  Administered 2014-03-23 – 2014-03-24 (×2): 500 mg via ORAL
  Filled 2014-03-23 (×2): qty 1

## 2014-03-23 NOTE — Progress Notes (Addendum)
[facility] PROGRESS NOTE  Jim Contreras VWU:[id number] DOB: 1923/11/13 DOA: 03/21/2014 PCP: [name], MD  Assessment/Plan: Principal Problem:   Acute on chronic respiratory failure with hypoxemia Active Problems:   COPD with emphysema Gold C    ILD (interstitial lung disease)   Pulmonary hypertension   Hypertension   Collagenous colitis   Pneumonia   Accidental fall from bed   Acute encephalopathy   Patient with COPD Gold Stage C admitted with dyspnea and acute resp failure   Assessment and plan Acute on chronic respiratory failure with hypoxemia/ HCAP  Healthcare associated pneumonia  COPD exacerbation  Interstitial lung disease with pulmonary hypertension  - has seen Dr Delford Field as outpt - on Vanc and Azactam- will add Zithromax  - cont steroids, nebs and O2  On oxygen 2 L at home, currently on  venti mask, will transfer if able to titrate his oxygen levels down Did not need BiPAP during this hospitalization since admission Speech therapy evaluation to rule out aspiration  Chronic stable Diastolic heart failure Will discontinue IV fluids Give 1 dose of Lasix x1 to see the oxygen can be tapered 2-D echo  Hypertension  - resume home meds   BPH  Continue Avodart and Flomax   GERD  continue Protonix   Acute encephalopathy   Fall from bed-follow mental status  - PT eval in AM    Code Status: full Family Communication: family updated about patient's clinical progress Disposition Plan:  As above    Brief narrative: Jim Contreras is a 80 y.o. male with Past medical history of hypertension, COPD with chronic resp failure on chronic o2 3LPM, CVA, interstitial lung disease, BPH, diastolic dysfunction, pulmonary hypertension.  The patient is presenting with complaints of shortness of breath ongoing with last 1 week progressively worsening along with cough with expectoration. He also had fever and chills. He denies any chest pain. He denies any choking  episode. He is from an assisted living facility. No recent travel reported. No leg swelling or leg tenderness. No palpitation dizziness lightheadedness nausea vomiting abdominal pain diarrhea constipation burning urination.  Medication has been notified based on the documentation available from the nursing home and no recent change identified.  Initially the history was reported that the patient had a fall twice on my discussion with the nursing home staff today while the patient was coming out of the bed when his wheelchair was not locked, He fell down on the ground, no head injury or neck injury reported.  Also since last 2 days the patient has not been acting himself as per the nursing home staff.  The patient is coming from SNF. And at his baseline independent for most of his ADL.   Consultants:  None  Procedures:  None  Antibiotics:  Vancomycin aztreonam  HPI/Subjective: Frustrated with his care overnight Could not sleep could not find the alarm bell, IV came out  Objective: Filed Vitals:   03/23/14 0246 03/23/14 0445 03/23/14 0744 03/23/14 0930  BP:  144/48 140/65   Pulse:  90 88   Temp:  97.9 F (36.6 C) 97.6 F (36.4 C)   TempSrc:  Axillary Oral   Resp:  20 20   Height:      Weight:  73 kg (160 lb 15 oz)    SpO2: 90% 90% 93% 93%    Intake/Output Summary (Last 24 hours) at 03/23/14 1112 Last data filed at 03/23/14 0900  Gross per 24 hour  Intake  825 ml  Output      0 ml  Net    825 ml    Exam:  General: alert & oriented x 3 In NAD  Cardiovascular: RRR, nl S1 s2  Respiratory: Decreased breath sounds at the bases, scattered rhonchi, no crackles  Abdomen: soft +BS NT/ND, no masses palpable  Extremities: No cyanosis and no edema      Data Reviewed: Basic Metabolic Panel:  Recent Labs Lab 03/21/14 1725 03/22/14 0253  NA 137 140  K 4.0 3.8  CL 103 108  CO2 22 20  GLUCOSE 119* 182*  BUN 19 17  CREATININE 1.15 1.13  CALCIUM 9.2 7.8*     Liver Function Tests:  Recent Labs Lab 03/21/14 1725 03/22/14 0253  AST 17 15  ALT 9 8  ALKPHOS 61 51  BILITOT 0.7 0.5  PROT 7.4 6.2  ALBUMIN 3.4* 2.7*   No results found for this basename: LIPASE, AMYLASE,  in the last 168 hours No results found for this basename: AMMONIA,  in the last 168 hours  CBC:  Recent Labs Lab 03/21/14 1725 03/22/14 0253  WBC 15.6* 12.2*  NEUTROABS 13.2* 11.6*  HGB 13.2 12.5*  HCT 40.9 39.4  MCV 77.6* 79.0  PLT 195 171    Cardiac Enzymes: No results found for this basename: CKTOTAL, CKMB, CKMBINDEX, TROPONINI,  in the last 168 hours BNP (last 3 results) No results found for this basename: PROBNP,  in the last 8760 hours   CBG: No results found for this basename: GLUCAP,  in the last 168 hours  Recent Results (from the past 240 hour(s))  CULTURE, BLOOD (ROUTINE X 2)     Status: None   Collection Time    03/21/14  5:25 PM      Result Value Ref Range Status   Specimen Description BLOOD RIGHT FOREARM   Final   Special Requests BOTTLES DRAWN AEROBIC AND ANAEROBIC 5CC   Final   Culture  Setup Time     Final   Value: 03/21/2014 23:02     Performed at [facility]   Culture     Final   Value:        BLOOD CULTURE RECEIVED NO GROWTH TO DATE CULTURE WILL BE HELD FOR 5 DAYS BEFORE ISSUING A FINAL NEGATIVE REPORT     Performed at [facility]   Report Status PENDING   Incomplete  CULTURE, BLOOD (ROUTINE X 2)     Status: None   Collection Time    03/21/14  5:40 PM      Result Value Ref Range Status   Specimen Description BLOOD LEFT FOREARM   Final   Special Requests BOTTLES DRAWN AEROBIC ONLY 1CC   Final   Culture  Setup Time     Final   Value: 03/21/2014 23:04     Performed at [facility]   Culture     Final   Value:        BLOOD CULTURE RECEIVED NO GROWTH TO DATE CULTURE WILL BE HELD FOR 5 DAYS BEFORE ISSUING A FINAL NEGATIVE REPORT     Performed at [facility]   Report Status PENDING   Incomplete   MRSA PCR SCREENING     Status: None   Collection Time    03/21/14 10:49 PM      Result Value Ref Range Status   MRSA by PCR NEGATIVE  NEGATIVE Final   Comment:  The GeneXpert MRSA Assay (FDA     approved for NASAL specimens     only), is one component of a     comprehensive MRSA colonization     surveillance program. It is not     intended to diagnose MRSA     infection nor to guide or     monitor treatment for     MRSA infections.     Studies: Ct Head Wo Contrast  03/21/2014   CLINICAL DATA:  Altered mental status after fall.  EXAM: CT HEAD WITHOUT CONTRAST  TECHNIQUE: Contiguous axial images were obtained from the base of the skull through the vertex without intravenous contrast.  COMPARISON:  CT scan of December 31, 2012.  FINDINGS: Bony calvarium appears intact. Diffuse cortical atrophy is noted. Mild chronic ischemic white matter disease is noted. No mass effect or midline shift is noted. Ventricular size is within normal limits. There is no evidence of mass lesion, hemorrhage or acute infarction.  IMPRESSION: Diffuse cortical atrophy. Mild chronic ischemic white matter disease. No acute intracranial abnormality seen.   Electronically Signed   By: Roque LiasJames  Green M.D.   On: 03/21/2014 18:26   Dg Chest Port 1 View  03/21/2014   CLINICAL DATA:  Confusion fever.  Hypertension.  COPD.  Cough.  EXAM: PORTABLE CHEST - 1 VIEW  COMPARISON:  12/31/2012.  03/09/2012  FINDINGS: Midline trachea. Mild cardiomegaly with atherosclerosis in the transverse aorta. No right and no definite left pleural effusion. Lower lobe predominant interstitial thickening is moderate. Right upper lobe scarring. More confluent left lower lobe patchy airspace disease.  IMPRESSION: Left lower lobe airspace disease, suspicious for infection or aspiration. Progression of interstitial lung disease is felt less likely.  Interstitial lung disease as was detailed on 05/01/2013 high-resolution CT. Considerations include  alveolar microlithiasis superimposed upon nonspecific interstitial pneumonitis.  Cardiomegaly without congestive failure.   Electronically Signed   By: Jeronimo GreavesKyle  Talbot M.D.   On: 03/21/2014 17:58    Scheduled Meds: . amLODipine  5 mg Oral Daily  . antiseptic oral rinse  7 mL Mouth Rinse BID  . aspirin EC  81 mg Oral Daily  . azithromycin  500 mg Intravenous Q24H  . aztreonam  1 g Intravenous 3 times per day  . dutasteride  0.5 mg Oral Daily  . enoxaparin (LOVENOX) injection  40 mg Subcutaneous Q24H  . furosemide  40 mg Intravenous Once  . guaiFENesin  600 mg Oral BID  . ipratropium-albuterol  3 mL Nebulization Q6H  . losartan  50 mg Oral Daily  . methylPREDNISolone (SOLU-MEDROL) injection  60 mg Intravenous Q6H  . pantoprazole  40 mg Oral Daily  . pravastatin  40 mg Oral Daily  . tamsulosin  0.4 mg Oral Daily  . vancomycin  1,000 mg Intravenous Q24H   Continuous Infusions:   Principal Problem:   Acute on chronic respiratory failure with hypoxemia Active Problems:   COPD with emphysema Gold C    ILD (interstitial lung disease)   Pulmonary hypertension   Hypertension   Collagenous colitis   Pneumonia   Accidental fall from bed   Acute encephalopathy    Time spent: 40 minutes   [facility],Norita Meigs  [facility] Pager [phone]. If 7PM-7AM, please contact night-coverage at [email or web address], password [email or web address] 03/23/2014, 11:12 AM  LOS: 2 days

## 2014-03-23 NOTE — Evaluation (Signed)
Clinical/Bedside Swallow Evaluation Patient Details  Name: Jim Contreras MRN: [id number] Date of Birth: [birth year]  Today's Date: 03/23/2014 Time: [id number] SLP Time Calculation (min): 10 min  Past Medical History:  Past Medical History  Diagnosis Date  . Hypertension   . Hypercholesteremia   . BPH (benign prostatic hyperplasia)   . ILD (interstitial lung disease)   . COPD, moderate   . Stroke     Thalamic   Past Surgical History:  Past Surgical History  Procedure Laterality Date  . Cataract extraction Bilateral   . Appendectomy    . Hernia repair    . Flexible sigmoidoscopy N/A 04/24/2013    Procedure: FLEXIBLE SIGMOIDOSCOPY;  Surgeon: Shirley FriarVincent C. Schooler, MD;  [facility];  Service: Endoscopy;  Laterality: N/A;   HPI:  80 yo male admitted for SOB, productive cough, and confusion with LLL airspace disease per CXR. PMH includes COPD, ILD, GERD, hypertension. Pt had MBS in 2013 with deep, silent penetration to the vocal cords with large sips of thin liquid. SLP recommended a regular diet and thin liquids with small, single sips to increase airway protection.   Assessment / Plan / Recommendation Clinical Impression  Pt was seen with a few trials of thin liquids, as limited pt had limited participation and declined further liquids and any solid intake. A gentle throat clear was noted x1 with a large cup sip. SLP provided Max cues for use of compensatory strategies recommended during OP MBS, despite pt verbalizing that he already knew and understood them. Pt became increasingly frustrated with attempts at PO trials by SLP, but was agreeable to SLP return at another time. Given limited assessment, would recommend to continue with recommendations from previous MBS which include regular textures and thin liquids via small, single sips. Would proceed with some caution as current respiratory status can increase risk of aspiration.    Aspiration Risk  Moderate    Diet  Recommendation Regular;Thin liquid   Liquid Administration via: Cup;Straw ("i have to use a straw") Medication Administration: Whole meds with puree Supervision: Patient able to self feed;Intermittent supervision to cue for compensatory strategies Compensations: Slow rate;Small sips/bites Postural Changes and/or Swallow Maneuvers: Seated upright 90 degrees    Other  Recommendations Oral Care Recommendations: Oral care BID   Follow Up Recommendations   (TBD, none anticipated)    Frequency and Duration min 2x/week  1 week   Pertinent Vitals/Pain n/a    SLP Swallow Goals     Swallow Study Prior Functional Status       General Date of Onset: 03/23/14 HPI: 80 yo male admitted for SOB, productive cough, and confusion with LLL airspace disease per CXR. PMH includes COPD, ILD, GERD, hypertension. Pt had MBS in 2013 with deep, silent penetration to the vocal cords with large sips of thin liquid. SLP recommended a regular diet and thin liquids with small, single sips to increase airway protection. Type of Study: Bedside swallow evaluation Previous Swallow Assessment: see HPI Diet Prior to this Study: Regular;Thin liquids Temperature Spikes Noted: Yes (low grade) Respiratory Status: venti-mask History of Recent Intubation: No Behavior/Cognition: Alert;Uncooperative Oral Cavity - Dentition: Adequate natural dentition Self-Feeding Abilities: Able to feed self Patient Positioning: Upright in bed Baseline Vocal Quality: Clear    Oral/Motor/Sensory Function     Ice Chips Ice chips: Not tested   Thin Liquid Thin Liquid: Impaired Presentation: Cup;Self Fed;Straw Pharyngeal  Phase Impairments: Throat Clearing - Immediate (x1)    Nectar Thick Nectar Thick Liquid: Not tested  Honey Thick Honey Thick Liquid: Not tested   Puree Puree: Not tested   Solid   GO    Solid: Not tested        Maxcine HamLaura Paiewonsky, M.A. CCC-SLP [phone]  Maxcine Hamaiewonsky, Ewing Fandino 03/23/2014,2:14  PM

## 2014-03-23 NOTE — Progress Notes (Signed)
Echo Lab  2D Echocardiogram completed.  Altheia Shafran L Daizha Anand, RDCS 03/23/2014 11:51 AM

## 2014-03-24 ENCOUNTER — Inpatient Hospital Stay (HOSPITAL_COMMUNITY): Payer: Medicare Other

## 2014-03-24 ENCOUNTER — Encounter (HOSPITAL_COMMUNITY): Payer: Self-pay | Admitting: Internal Medicine

## 2014-03-24 DIAGNOSIS — J9622 Acute and chronic respiratory failure with hypercapnia: Secondary | ICD-10-CM

## 2014-03-24 LAB — LEGIONELLA ANTIGEN, URINE

## 2014-03-24 LAB — PRO B NATRIURETIC PEPTIDE: Pro B Natriuretic peptide (BNP): 1012 pg/mL — ABNORMAL HIGH (ref 0–450)

## 2014-03-24 MED ORDER — FUROSEMIDE 10 MG/ML IJ SOLN
40.0000 mg | Freq: Once | INTRAMUSCULAR | Status: AC
  Administered 2014-03-24: 40 mg via INTRAVENOUS
  Filled 2014-03-24: qty 4

## 2014-03-24 MED ORDER — METHYLPREDNISOLONE SODIUM SUCC 125 MG IJ SOLR
125.0000 mg | Freq: Two times a day (BID) | INTRAMUSCULAR | Status: DC
Start: 2014-03-24 — End: 2014-03-24
  Administered 2014-03-24: 125 mg via INTRAVENOUS
  Filled 2014-03-24 (×2): qty 2

## 2014-03-24 MED ORDER — ALBUTEROL SULFATE (2.5 MG/3ML) 0.083% IN NEBU: 2.5000 mg | INHALATION_SOLUTION | RESPIRATORY_TRACT | Status: DC | PRN

## 2014-03-24 MED ORDER — SODIUM CHLORIDE 0.9 % IV SOLN
250.0000 mg | Freq: Four times a day (QID) | INTRAVENOUS | Status: DC
  Administered 2014-03-24 – 2014-03-28 (×15): 250 mg via INTRAVENOUS
  Filled 2014-03-24 (×18): qty 250

## 2014-03-24 MED ORDER — PREDNISONE 20 MG PO TABS
40.0000 mg | ORAL_TABLET | Freq: Every day | ORAL | Status: AC
  Administered 2014-03-25 – 2014-03-26 (×2): 40 mg via ORAL
  Filled 2014-03-24 (×3): qty 2

## 2014-03-24 MED ORDER — METHYLPREDNISOLONE SODIUM SUCC 125 MG IJ SOLR
60.0000 mg | Freq: Two times a day (BID) | INTRAMUSCULAR | Status: DC
  Filled 2014-03-24 (×2): qty 0.96

## 2014-03-24 MED ORDER — HYDRALAZINE HCL 20 MG/ML IJ SOLN: 10.0000 mg | Freq: Four times a day (QID) | INTRAMUSCULAR | Status: DC | PRN

## 2014-03-24 NOTE — Progress Notes (Signed)
eLink Physician-Brief Progress Note Patient Name: Jim QuintJames R Contreras DOB: [birth year] MRN: [id number]   Date of Service  03/24/2014  HPI/Events of Note  Called to room for hypoxemia Patient of Dr. Lynelle DoctorWright's, COPD, ILD; admitted on 10/8 Now improved oxygenation on NRB mask  eICU Interventions  Increase solumedrol, give extra dose of lasix Will have pulmonary fellow see this evening     Intervention Category Major Interventions: Hypoxemia - evaluation and management  Keeon Zurn 03/24/2014, 4:07 PM

## 2014-03-24 NOTE — Evaluation (Signed)
Physical Therapy Evaluation Patient Details Name: Jim QuintJames R Fors MRN: [id number] DOB: June 23, 1923 Today's Date: 03/24/2014   History of Present Illness  80 y.o. male with Past medical history of hypertension, COPD with chronic resp failure on chronic o2 3LPM, CVA, interstitial lung disease, BPH, diastolic dysfunction, pulmonary hypertension.   Pt adm due to worsening of SOB. Pt with Acute on chronic respiratory failure with hypoxemia.   Clinical Impression  Pt adm due to above. Pt presents with decreased independence with functional mobility secondary to deficits indicated below (see PT problem list). Pt to benefit from skilled acute PT to maximize functional mobility prior to D/C to next appropriate venue. Pt O2 declined to 82% on venturi mask at 12L during pre-gt activities. Pt reports at SNF prior to admission he was independent with transfers to wheelchair and mobilizing short distances with RW.     Follow Up Recommendations SNF    Equipment Recommendations  None recommended by PT    Recommendations for Other Services OT consult     Precautions / Restrictions Precautions Precautions: Fall Precaution Comments: monitor O2 sats Restrictions Weight Bearing Restrictions: No      Mobility  Bed Mobility Overal bed mobility: Needs Assistance Bed Mobility: Supine to Sit;Sit to Supine     Supine to sit: Mod assist;HOB elevated Sit to supine: Mod assist   General bed mobility comments: (A) required due to weakness and fatigue; cues for sequencing   Transfers Overall transfer level: Needs assistance Equipment used: 2 person hand held assist Transfers: Sit to/from Stand Sit to Stand: +2 physical assistance;Mod assist         General transfer comment: 2 person (A) to power up and maintain balance; pt with heavy lean posteriorly and bracing on bed; tolerated standing ~20 seconds; max cues for upright posture and weightshifting anteriorly to balance; O2 dropped to 82% and pt  returned to sitting EOB  Ambulation/Gait             General Gait Details: attempted pre gt activites at EOB; pt with heavy lean posteriorly and requried 2 person (A) for balance; O2 dropped to 82% and pt returned to sitting position   Stairs            Wheelchair Mobility    Modified Rankin (Stroke Patients Only)       Balance Overall balance assessment: Needs assistance;History of Falls Sitting-balance support: Feet supported;Bilateral upper extremity supported Sitting balance-Leahy Scale: Poor Sitting balance - Comments: bracing with UEs; denied any dizziness Postural control: Posterior lean Standing balance support: During functional activity;Bilateral upper extremity supported Standing balance-Leahy Scale: Zero Standing balance comment: 2 person (A) to maintain balance; pt with heavy lean posteriorly                             Pertinent Vitals/Pain Pain Assessment: No/denies pain    Home Living Family/patient expects to be discharged to:: Skilled nursing facility                 Additional Comments: Pt from SNF where he has been for rehab per pt    Prior Function Level of Independence: Needs assistance   Gait / Transfers Assistance Needed: pt has been using RW and wheelchair at SNF; reports he could stand and get to wheelchair independently   ADL's / Homemaking Assistance Needed: requiring (A) at SNF per pt        Hand Dominance  Extremity/Trunk Assessment   Upper Extremity Assessment: Defer to OT evaluation           Lower Extremity Assessment: Generalized weakness      Cervical / Trunk Assessment: Normal  Communication   Communication: HOH  Cognition Arousal/Alertness: Awake/alert Behavior During Therapy: WFL for tasks assessed/performed Overall Cognitive Status: Within Functional Limits for tasks assessed                      General Comments General comments (skin integrity, edema, etc.): pt with  non productive dry cough with activity; at incr risk for skin breakdown due to decr mobility discussed D/C recommendationswith pt; pt agreeable to SNF    Exercises        Assessment/Plan    PT Assessment Patient needs continued PT services  PT Diagnosis Difficulty walking;Generalized weakness   PT Problem List Decreased strength;Decreased activity tolerance;Decreased balance;Decreased mobility;Decreased knowledge of use of DME;Decreased safety awareness  PT Treatment Interventions DME instruction;Gait training;Functional mobility training;Therapeutic activities;Therapeutic exercise;Balance training;Neuromuscular re-education;Patient/family education   PT Goals (Current goals can be found in the Care Plan section) Acute Rehab PT Goals Patient Stated Goal: to get where i was at rehab PT Goal Formulation: With patient Time For Goal Achievement: 03/31/14 Potential to Achieve Goals: Fair    Frequency Min 3X/week   Barriers to discharge Decreased caregiver support wife cannot provide physical (A)    Co-evaluation               End of Session Equipment Utilized During Treatment: Gait belt;Oxygen Activity Tolerance: Patient limited by fatigue Patient left: in bed;with call bell/phone within reach;with bed alarm set Nurse Communication: Mobility status;Precautions         Time: 1100-1120 PT Time Calculation (min): 20 min   Charges:   PT Evaluation $Initial PT Evaluation Tier I: 1 Procedure PT Treatments $Therapeutic Activity: 8-22 mins   PT G CodesDonell Sievert:          Edel Rivero N, [place]PT  [phone] 03/24/2014, 12:15 PM

## 2014-03-24 NOTE — Consult Note (Signed)
PULMONARY  / CRITICAL CARE MEDICINE CONSULTATION   Name: Jim Contreras MRN: [id number] DOB: [birth year]    ADMISSION DATE:  03/21/2014 CONSULTATION DATE: 03/24/2014  REQUESTING CLINICIAN: Dr. Susie Cassette PRIMARY SERVICE: TRH  CHIEF COMPLAINT:  SOB  BRIEF PATIENT DESCRIPTION: 80 M with COPD/ILD on chronic O2, patient of PW, who presented 10/8 with SOB likely 2/2 COPD exacerbation.  SIGNIFICANT EVENTS / STUDIES:  ABG on 03/21/2014 7.491/28.6/51.0 Chest X-ray 10/11 with worsening bibasilar opacities concerning for pneumonia  LINES / TUBES: PIV  CULTURES: Blood Cultures 03/21/2014 NGTD Urine Culture 03/21/2014 NGTD Flu Panel 10/8 Negative  ANTIBIOTICS: Imipenem 10/8-  Azithro 10/8-10/11 Aztreonam 10/8 - 10/11 Vanc 10/8-10/11  HISTORY OF PRESENT ILLNESS:  Jim Contreras is an 80 yo M with Gold C COPD/ILD on chronic O2 who presented to [facility] on 10/8 with worsening SOB. PCCM was consulted the evening of 10/11 for severe hypoxemia. A majority of the following history was obtained from chart review and discussion with the RN and RT as the patient is unable to provide much useful history. He was noted to be hypoxic on the monitors earlier this evening although per RN and RT his O2 was off intermittently and may not have been totally connected during the episode. He notes that he is not short of breath.  PAST MEDICAL HISTORY :  Past Medical History  Diagnosis Date  . Hypertension   . Hypercholesteremia   . BPH (benign prostatic hyperplasia)   . ILD (interstitial lung disease)   . COPD, moderate   . Stroke     Thalamic   Past Surgical History  Procedure Laterality Date  . Cataract extraction Bilateral   . Appendectomy    . Hernia repair    . Flexible sigmoidoscopy N/A 04/24/2013    Procedure: FLEXIBLE SIGMOIDOSCOPY;  Surgeon: Shirley Friar, MD;  Location: [facility] ENDOSCOPY;  Service: Endoscopy;  Laterality: N/A;   Prior to Admission medications   Medication Sig Start Date End Date  Taking? Authorizing Provider  amLODipine (NORVASC) 5 MG tablet Take 5 mg by mouth daily.  04/10/12  Yes Historical Provider, MD  aspirin 81 MG tablet Take 81 mg by mouth daily.   Yes Historical Provider, MD  dutasteride (AVODART) 0.5 MG capsule Take 0.5 mg by mouth daily.   Yes Historical Provider, MD  lansoprazole (PREVACID) 30 MG capsule Take 30 mg by mouth daily at 12 noon.   Yes Historical Provider, MD  losartan (COZAAR) 50 MG tablet Take 50 mg by mouth daily.     Yes Historical Provider, MD  pravastatin (PRAVACHOL) 40 MG tablet Take 40 mg by mouth daily.   Yes Historical Provider, MD  Tamsulosin HCl (FLOMAX) 0.4 MG CAPS Take 0.4 mg by mouth daily.   Yes Historical Provider, MD  tiotropium (SPIRIVA HANDIHALER) 18 MCG inhalation capsule Place 1 capsule (18 mcg total) into inhaler and inhale daily. 04/02/13 04/02/14 Yes Storm Frisk, MD   Allergies  Allergen Reactions  . Floxin [Ofloxacin] Other (See Comments)    Lost feeling in feet  . Penicillins Other (See Comments)    Ulcers in mouth  . Sulfa Antibiotics Other (See Comments)    Ulcers in mouth    FAMILY HISTORY:  No family history on file. SOCIAL HISTORY:  reports that he quit smoking about 42 years ago. His smoking use included Cigarettes. He has a 40 pack-year smoking history. He has never used smokeless tobacco. He reports that he drinks alcohol. He reports that he does not use illicit  drugs.  REVIEW OF SYSTEMS:  Unable to obtain secondary to patient condition.   SUBJECTIVE:   VITAL SIGNS: Temp:  [97.3 F (36.3 C)-98.2 F (36.8 C)] 97.3 F (36.3 C) (10/11 1648) Pulse Rate:  [72-98] 74 (10/11 1648) Resp:  [15-26] 17 (10/11 1648) BP: (127-168)/(61-76) 135/62 mmHg (10/11 1648) SpO2:  [90 %-100 %] 96 % (10/11 2019) FiO2 (%):  [50 %-55 %] 50 % (10/11 1127) Weight:  [158 lb 1.1 oz (71.7 kg)] 158 lb 1.1 oz (71.7 kg) (10/11 0457) HEMODYNAMICS:   VENTILATOR SETTINGS: Vent Mode:  [-]  FiO2 (%):  [50 %-55 %] 50  % INTAKE / OUTPUT: Intake/Output     10/11 0701 - 10/12 0700   I.V. (mL/kg)    IV Piggyback 200   Total Intake(mL/kg) 200 (2.8)   Urine (mL/kg/hr) 1800 (1.9)   Total Output 1800   Net -1600         PHYSICAL EXAMINATION: General:  Elderly male in NAD Neuro:  Grossly intact HEENT:  Sclera anicteric, conjunctiva pink, MMM, OP clear Neck: Trachea supple and midline, (-) LAN or JVD Cardiovascular:  RRR, nS1/S2, (-) MRG Lungs:  Distant with mild rales bilaterally at the bases Abdomen:  S/NT/ND/(+)BS Musculoskeletal:  (-) C/C/E Skin:  Grossly Intact  LABS:  CBC  Recent Labs Lab 03/21/14 1725 03/22/14 0253  WBC 15.6* 12.2*  HGB 13.2 12.5*  HCT 40.9 39.4  PLT 195 171   Coag's No results found for this basename: APTT, INR,  in the last 168 hours BMET  Recent Labs Lab 03/21/14 1725 03/22/14 0253  NA 137 140  K 4.0 3.8  CL 103 108  CO2 22 20  BUN 19 17  CREATININE 1.15 1.13  GLUCOSE 119* 182*   Electrolytes  Recent Labs Lab 03/21/14 1725 03/22/14 0253  CALCIUM 9.2 7.8*   Sepsis Markers  Recent Labs Lab 03/21/14 1747 03/21/14 2030  LATICACIDVEN 1.45 0.58   ABG  Recent Labs Lab 03/21/14 1747  PHART 7.491*  PCO2ART 28.6*  PO2ART 51.0*   Liver Enzymes  Recent Labs Lab 03/21/14 1725 03/22/14 0253  AST 17 15  ALT 9 8  ALKPHOS 61 51  BILITOT 0.7 0.5  ALBUMIN 3.4* 2.7*   Cardiac Enzymes No results found for this basename: TROPONINI, PROBNP,  in the last 168 hours Glucose No results found for this basename: GLUCAP,  in the last 168 hours  Imaging Dg Chest Port 1 View  03/24/2014   CLINICAL DATA:  Shortness of Breath.  COPD.  Smoker.  A a seen  EXAM: PORTABLE CHEST - 1 VIEW  COMPARISON:  03/21/2014  FINDINGS: Continued left lower lobe airspace disease is similar prior study. In addition, there is increasing right basilar airspace opacity. Findings concerning for multifocal pneumonia.  Underlying COPD/hyperinflation. Heart is borderline in  size. No effusions or acute bony abnormality.  IMPRESSION: Stable left lower lobe airspace disease with worsening right basilar airspace opacity. Findings concerning for worsening multifocal pneumonia.   Electronically Signed   By: Charlett NoseKevin  Dover M.D.   On: 03/24/2014 15:17    CXR: Personally reviewed. Worsened biltaeral basilar predominant opacities.   ASSESSMENT / PLAN:   1. Acute on Chronic Hypoxic Respiratory Failure: Given elevated WBC count on presentation and X-ray suspect HCAP was the initiating factor in the acute decompensation. Pneumonia can also explain the worsened falls noted recently. I agree with the antibiotic plan discussed in the pharmacy note from earlier today. I also agree with treatment for COPD and careful  fluid management.   Continue Imipenem, would complete a total course of 8 days of antibiotics for HCAP  DC methylprednisone, start prednisone 40 mg daily to complete 5 days of steroids  Continue Standing Duonebs and PRN albuterol  Check BNP, no great evidence of fluid overload on exam and would caution against using CXray as criteria for deciding patient is wet  TODAY'S SUMMARY:   I have personally obtained a history, examined the patient, evaluated laboratory and imaging results, formulated the assessment and plan and placed orders.  CRITICAL CARE: The patient is critically ill with multiple organ systems failure and requires high complexity decision making for assessment and support, frequent evaluation and titration of therapies, application of advanced monitoring technologies and extensive interpretation of multiple databases. Critical Care Time devoted to patient care services described in this note is 30 minutes.   Evalyn CascoAdam Janene Yousuf, MD Pulmonary and Critical Care Medicine [facility] Pager: [phone]   03/24/2014, 8:32 PM

## 2014-03-24 NOTE — Progress Notes (Addendum)
[facility] PROGRESS NOTE  Jim QuintJames R Bachmann ZOX:[id number] DOB: 21-Apr-1924 DOA: 03/21/2014 PCP: Laurell JosephsMorrow, Aaron P, MD  Assessment/Plan: Principal Problem:   Acute on chronic respiratory failure with hypoxemia Active Problems:   COPD with emphysema Gold C    ILD (interstitial lung disease)   Pulmonary hypertension   Hypertension   Collagenous colitis   Pneumonia   Accidental fall from bed   Acute encephalopathy    Patient with COPD Gold Stage C admitted with dyspnea and acute hypoxemic resp failure  Assessment and plan  Acute on chronic hypoxemic respiratory failure secondary to left lower lobe pneumonia Healthcare associated pneumonia  COPD exacerbation  Interstitial lung disease with pulmonary hypertension  - has seen Dr Delford FieldWright as outpt Discontinue Azithromycin, Azactam, and Vancomycin  - Initiate Imipenem 250 mg IV q6h - cont wean  steroids, nebs and O2  On oxygen 2 L at home, currently on venti mask, will transfer if able to titrate his oxygen levels down  Start chest percussion therapy, out of bed to chair Did not need BiPAP during this hospitalization since admission  Speech therapy evaluation recommends regular diet and thin liquids Repeat chest x-ray today Repeat one more dose of Lasix to help wean oxygen down Baseline is 2 L per minute of oxygen at home continuous  Chronic stable Diastolic heart failure  Will discontinue IV fluids  Give 1 dose of Lasix x1 to see the oxygen can be tapered  2-D echo , completed results pending  Hypertension  - resume home meds   BPH  Continue Avodart and Flomax  GERD  continue Protonix   Acute encephalopathy  Fall from bed-follow mental status  - PT eval in AM    Code Status: full  Family Communication: family updated about patient's clinical progress  Disposition Plan: Attempting to transfer out to step down and oxygenation is better  Brief narrative:  Jim Contreras is a 80 y.o. male with Past medical history of  hypertension, COPD with chronic resp failure on chronic o2 3LPM, CVA, interstitial lung disease, BPH, diastolic dysfunction, pulmonary hypertension.  The patient is presenting with complaints of shortness of breath ongoing with last 1 week progressively worsening along with cough with expectoration. He also had fever and chills. He denies any chest pain. He denies any choking episode. He is from an assisted living facility. No recent travel reported. No leg swelling or leg tenderness. No palpitation dizziness lightheadedness nausea vomiting abdominal pain diarrhea constipation burning urination.  Medication has been notified based on the documentation available from the nursing home and no recent change identified.  Initially the history was reported that the patient had a fall twice on my discussion with the nursing home staff today while the patient was coming out of the bed when his wheelchair was not locked, He fell down on the ground, no head injury or neck injury reported.  Also since last 2 days the patient has not been acting himself as per the nursing home staff.  The patient is coming from SNF. And at his baseline independent for most of his ADL.   Consultants:  None Procedures:  None Antibiotics:  Vancomycin aztreonam/azithromycin    HPI/Subjective: Confused,daughter by the bedside, updated her  Objective: Filed Vitals:   03/23/14 2137 03/23/14 2309 03/24/14 0457 03/24/14 0751  BP:  168/72 127/61 143/76  Pulse: 80 89 98 72  Temp:  98.1 F (36.7 C) 98.2 F (36.8 C) 97.6 F (36.4 C)  TempSrc:  Oral Oral Oral  Resp: 24 22 15 21   Height:      Weight:   71.7 kg (158 lb 1.1 oz)   SpO2: 93% 98% 90% 90%    Intake/Output Summary (Last 24 hours) at 03/24/14 1058 Last data filed at 03/24/14 0752  Gross per 24 hour  Intake    250 ml  Output   1000 ml  Net   -750 ml    Exam:  General: alert & oriented x self, In NAD , slightly confused Cardiovascular: RRR, nl S1 s2   Respiratory: Decreased breath sounds at the bases, still wheezing, no crackles  Abdomen: soft +BS NT/ND, no masses palpable  Extremities: No cyanosis and no edema      Data Reviewed: Basic Metabolic Panel:  Recent Labs Lab 03/21/14 1725 03/22/14 0253  NA 137 140  K 4.0 3.8  CL 103 108  CO2 22 20  GLUCOSE 119* 182*  BUN 19 17  CREATININE 1.15 1.13  CALCIUM 9.2 7.8*    Liver Function Tests:  Recent Labs Lab 03/21/14 1725 03/22/14 0253  AST 17 15  ALT 9 8  ALKPHOS 61 51  BILITOT 0.7 0.5  PROT 7.4 6.2  ALBUMIN 3.4* 2.7*   No results found for this basename: LIPASE, AMYLASE,  in the last 168 hours No results found for this basename: AMMONIA,  in the last 168 hours  CBC:  Recent Labs Lab 03/21/14 1725 03/22/14 0253  WBC 15.6* 12.2*  NEUTROABS 13.2* 11.6*  HGB 13.2 12.5*  HCT 40.9 39.4  MCV 77.6* 79.0  PLT 195 171    Cardiac Enzymes: No results found for this basename: CKTOTAL, CKMB, CKMBINDEX, TROPONINI,  in the last 168 hours BNP (last 3 results) No results found for this basename: PROBNP,  in the last 8760 hours   CBG: No results found for this basename: GLUCAP,  in the last 168 hours  Recent Results (from the past 240 hour(s))  CULTURE, BLOOD (ROUTINE X 2)     Status: None   Collection Time    03/21/14  5:25 PM      Result Value Ref Range Status   Specimen Description BLOOD RIGHT FOREARM   Final   Special Requests BOTTLES DRAWN AEROBIC AND ANAEROBIC 5CC   Final   Culture  Setup Time     Final   Value: 03/21/2014 23:02     Performed at [facility]   Culture     Final   Value:        BLOOD CULTURE RECEIVED NO GROWTH TO DATE CULTURE WILL BE HELD FOR 5 DAYS BEFORE ISSUING A FINAL NEGATIVE REPORT     Performed at [facility]   Report Status PENDING   Incomplete  CULTURE, BLOOD (ROUTINE X 2)     Status: None   Collection Time    03/21/14  5:40 PM      Result Value Ref Range Status   Specimen Description BLOOD LEFT FOREARM    Final   Special Requests BOTTLES DRAWN AEROBIC ONLY 1CC   Final   Culture  Setup Time     Final   Value: 03/21/2014 23:04     Performed at [facility]   Culture     Final   Value:        BLOOD CULTURE RECEIVED NO GROWTH TO DATE CULTURE WILL BE HELD FOR 5 DAYS BEFORE ISSUING A FINAL NEGATIVE REPORT     Performed at [facility]   Report Status PENDING  Incomplete  MRSA PCR SCREENING     Status: None   Collection Time    03/21/14 10:49 PM      Result Value Ref Range Status   MRSA by PCR NEGATIVE  NEGATIVE Final   Comment:            The GeneXpert MRSA Assay (FDA     approved for NASAL specimens     only), is one component of a     comprehensive MRSA colonization     surveillance program. It is not     intended to diagnose MRSA     infection nor to guide or     monitor treatment for     MRSA infections.     Studies: Ct Head Wo Contrast  03/21/2014   CLINICAL DATA:  Altered mental status after fall.  EXAM: CT HEAD WITHOUT CONTRAST  TECHNIQUE: Contiguous axial images were obtained from the base of the skull through the vertex without intravenous contrast.  COMPARISON:  CT scan of December 31, 2012.  FINDINGS: Bony calvarium appears intact. Diffuse cortical atrophy is noted. Mild chronic ischemic white matter disease is noted. No mass effect or midline shift is noted. Ventricular size is within normal limits. There is no evidence of mass lesion, hemorrhage or acute infarction.  IMPRESSION: Diffuse cortical atrophy. Mild chronic ischemic white matter disease. No acute intracranial abnormality seen.   Electronically Signed   By: Roque Lias M.D.   On: 03/21/2014 18:26   Dg Chest Port 1 View  03/21/2014   CLINICAL DATA:  Confusion fever.  Hypertension.  COPD.  Cough.  EXAM: PORTABLE CHEST - 1 VIEW  COMPARISON:  12/31/2012.  03/09/2012  FINDINGS: Midline trachea. Mild cardiomegaly with atherosclerosis in the transverse aorta. No right and no definite left pleural effusion. Lower  lobe predominant interstitial thickening is moderate. Right upper lobe scarring. More confluent left lower lobe patchy airspace disease.  IMPRESSION: Left lower lobe airspace disease, suspicious for infection or aspiration. Progression of interstitial lung disease is felt less likely.  Interstitial lung disease as was detailed on 05/01/2013 high-resolution CT. Considerations include alveolar microlithiasis superimposed upon nonspecific interstitial pneumonitis.  Cardiomegaly without congestive failure.   Electronically Signed   By: Jeronimo Greaves M.D.   On: 03/21/2014 17:58    Scheduled Meds: . amLODipine  5 mg Oral Daily  . antiseptic oral rinse  7 mL Mouth Rinse BID  . aspirin EC  81 mg Oral Daily  . azithromycin  500 mg Oral Daily  . aztreonam  1 g Intravenous 3 times per day  . dutasteride  0.5 mg Oral Daily  . enoxaparin (LOVENOX) injection  40 mg Subcutaneous Q24H  . guaiFENesin  600 mg Oral BID  . ipratropium-albuterol  3 mL Nebulization TID  . losartan  50 mg Oral Daily  . methylPREDNISolone (SOLU-MEDROL) injection  60 mg Intravenous Q12H  . pantoprazole  40 mg Oral Daily  . pravastatin  40 mg Oral Daily  . tamsulosin  0.4 mg Oral Daily  . vancomycin  1,000 mg Intravenous Q24H   Continuous Infusions:   Principal Problem:   Acute on chronic respiratory failure with hypoxemia Active Problems:   COPD with emphysema Gold C    ILD (interstitial lung disease)   Pulmonary hypertension   Hypertension   Collagenous colitis   Pneumonia   Accidental fall from bed   Acute encephalopathy    Time spent: 40 minutes   [facility]  [facility] Pager [phone]. If 7PM-7AM,  please contact night-coverage at [email or web address], password [email or web address] 03/24/2014, 10:58 AM  LOS: 3 days

## 2014-03-24 NOTE — Progress Notes (Signed)
ANTIBIOTIC CONSULT NOTE - INITIAL  Pharmacy Consult for Imipenem Indication: HCAP  Allergies  Allergen Reactions  . Floxin [Ofloxacin] Other (See Comments)    Lost feeling in feet  . Penicillins Other (See Comments)    Ulcers in mouth  . Sulfa Antibiotics Other (See Comments)    Ulcers in mouth    Patient Measurements: Height: 5\' 7"  (170.2 cm) Weight: 158 lb 1.1 oz (71.7 kg) IBW/kg (Calculated) : 66.1  Vital Signs: Temp: 97.4 F (36.3 C) (10/11 1127) Temp Source: Oral (10/11 1127) BP: 148/64 mmHg (10/11 1127) Pulse Rate: 76 (10/11 1127)  Labs:  Recent Labs  03/21/14 1725 03/22/14 0253  WBC 15.6* 12.2*  HGB 13.2 12.5*  PLT 195 171  CREATININE 1.15 1.13   Estimated Creatinine Clearance: 42.2 ml/min (by C-G formula based on Cr of 1.13). No results found for this basename: VANCOTROUGH, VANCOPEAK, VANCORANDOM, GENTTROUGH, GENTPEAK, GENTRANDOM, TOBRATROUGH, TOBRAPEAK, TOBRARND, AMIKACINPEAK, AMIKACINTROU, AMIKACIN,  in the last 72 hours   Microbiology: Recent Results (from the past 720 hour(s))  CULTURE, BLOOD (ROUTINE X 2)     Status: None   Collection Time    03/21/14  5:25 PM      Result Value Ref Range Status   Specimen Description BLOOD RIGHT FOREARM   Final   Special Requests BOTTLES DRAWN AEROBIC AND ANAEROBIC 5CC   Final   Culture  Setup Time     Final   Value: 03/21/2014 23:02     Performed at [facility]   Culture     Final   Value:        BLOOD CULTURE RECEIVED NO GROWTH TO DATE CULTURE WILL BE HELD FOR 5 DAYS BEFORE ISSUING A FINAL NEGATIVE REPORT     Performed at [facility]   Report Status PENDING   Incomplete  CULTURE, BLOOD (ROUTINE X 2)     Status: None   Collection Time    03/21/14  5:40 PM      Result Value Ref Range Status   Specimen Description BLOOD LEFT FOREARM   Final   Special Requests BOTTLES DRAWN AEROBIC ONLY 1CC   Final   Culture  Setup Time     Final   Value: 03/21/2014 23:04     Performed at [facility]   Culture     Final   Value:        BLOOD CULTURE RECEIVED NO GROWTH TO DATE CULTURE WILL BE HELD FOR 5 DAYS BEFORE ISSUING A FINAL NEGATIVE REPORT     Performed at [facility]   Report Status PENDING   Incomplete  MRSA PCR SCREENING     Status: None   Collection Time    03/21/14 10:49 PM      Result Value Ref Range Status   MRSA by PCR NEGATIVE  NEGATIVE Final   Comment:            The GeneXpert MRSA Assay (FDA     approved for NASAL specimens     only), is one component of a     comprehensive MRSA colonization     surveillance program. It is not     intended to diagnose MRSA     infection nor to guide or     monitor treatment for     MRSA infections.    Medical History: Past Medical History  Diagnosis Date  . Hypertension   . Hypercholesteremia   . BPH (benign prostatic hyperplasia)   . ILD (  interstitial lung disease)   . COPD, moderate   . Stroke     Thalamic   Assessment: 80 y/o M presents from [facility] with confusion, fever, and two falls today. Pt has penicillin allergy. CXR suspicious for infection and code sepsis called.  Cultures drawn on 10/8 no growth thus far.  Patient is afebrile, WBC 12.2 and trending down. CrCl 42 mL/min. Stable, MAP 86.  Pharmacy consulted to begin imipenem.  Will begin with imipenem 250 mg IV q6h and monitor for clinical status.  May potentially increase to 500 mg q8h since renal function is on the edge of Crcl cutoffs.    Plan:  - Discontinue Azithromycin, Azactam, and Vancomycin - Initiate Imipenem 250 mg IV q6h - Monitor renal function closely and clinical status.  Red ChristiansSamson Ruta Capece, Pharm. D. Clinical Pharmacy Resident Pager: [phone] Ph: [phone] 03/24/2014 2:34 PM

## 2014-03-25 ENCOUNTER — Inpatient Hospital Stay (HOSPITAL_COMMUNITY): Payer: Medicare Other

## 2014-03-25 DIAGNOSIS — J439 Emphysema, unspecified: Secondary | ICD-10-CM

## 2014-03-25 LAB — CBC
HEMATOCRIT: 38 % — AB (ref 39.0–52.0)
HEMOGLOBIN: 12 g/dL — AB (ref 13.0–17.0)
MCH: 25.2 pg — AB (ref 26.0–34.0)
MCHC: 31.6 g/dL (ref 30.0–36.0)
MCV: 79.7 fL (ref 78.0–100.0)
Platelets: 194 10*3/uL (ref 150–400)
RBC: 4.77 MIL/uL (ref 4.22–5.81)
RDW: 17.2 % — ABNORMAL HIGH (ref 11.5–15.5)
WBC: 7.8 10*3/uL (ref 4.0–10.5)

## 2014-03-25 LAB — COMPREHENSIVE METABOLIC PANEL
ALK PHOS: 41 U/L (ref 39–117)
ALT: 16 U/L (ref 0–53)
AST: 22 U/L (ref 0–37)
Albumin: 2.6 g/dL — ABNORMAL LOW (ref 3.5–5.2)
Anion gap: 13 (ref 5–15)
BUN: 31 mg/dL — ABNORMAL HIGH (ref 6–23)
CALCIUM: 8.3 mg/dL — AB (ref 8.4–10.5)
CO2: 25 mEq/L (ref 19–32)
Chloride: 104 mEq/L (ref 96–112)
Creatinine, Ser: 1.12 mg/dL (ref 0.50–1.35)
GFR calc non Af Amer: 57 mL/min — ABNORMAL LOW (ref >90)
GFR, EST AFRICAN AMERICAN: 66 mL/min — AB (ref >90)
GLUCOSE: 137 mg/dL — AB (ref 70–99)
Potassium: 3.6 mEq/L — ABNORMAL LOW (ref 3.7–5.3)
Sodium: 142 mEq/L (ref 137–147)
TOTAL PROTEIN: 6.1 g/dL (ref 6.0–8.3)
Total Bilirubin: 0.3 mg/dL (ref 0.3–1.2)

## 2014-03-25 LAB — URINE CULTURE
Colony Count: NO GROWTH
Culture: NO GROWTH

## 2014-03-25 MED ORDER — FUROSEMIDE 10 MG/ML IJ SOLN
40.0000 mg | Freq: Two times a day (BID) | INTRAMUSCULAR | Status: AC
  Administered 2014-03-25 (×2): 40 mg via INTRAVENOUS
  Filled 2014-03-25 (×2): qty 4

## 2014-03-25 MED ORDER — TECHNETIUM TC 99M DIETHYLENETRIAME-PENTAACETIC ACID: 40.0000 | Freq: Once | INTRAVENOUS | Status: AC | PRN

## 2014-03-25 MED ORDER — POTASSIUM CHLORIDE CRYS ER 20 MEQ PO TBCR
20.0000 meq | EXTENDED_RELEASE_TABLET | Freq: Two times a day (BID) | ORAL | Status: DC
  Administered 2014-03-25 – 2014-04-05 (×22): 20 meq via ORAL
  Filled 2014-03-25 (×28): qty 1

## 2014-03-25 MED ORDER — AMLODIPINE BESYLATE 10 MG PO TABS
10.0000 mg | ORAL_TABLET | Freq: Every day | ORAL | Status: DC
  Administered 2014-03-26 – 2014-04-03 (×8): 10 mg via ORAL
  Filled 2014-03-25 (×10): qty 1

## 2014-03-25 MED ORDER — TECHNETIUM TO 99M ALBUMIN AGGREGATED
6.0000 | Freq: Once | INTRAVENOUS | Status: AC | PRN
  Administered 2014-03-25: 6 via INTRAVENOUS

## 2014-03-25 MED ORDER — HYDRALAZINE HCL 20 MG/ML IJ SOLN: 10.0000 mg | INTRAMUSCULAR | Status: DC | PRN

## 2014-03-25 NOTE — Progress Notes (Addendum)
[facility] PROGRESS NOTE  Jim Contreras ZOX:[id number] DOB: 04-08-1924 DOA: 03/21/2014 PCP: [name], MD  Assessment/Plan: Principal Problem:   Acute on chronic respiratory failure with hypoxemia Active Problems:   COPD with emphysema Gold C    ILD (interstitial lung disease)   Pulmonary hypertension   Hypertension   Collagenous colitis   Pneumonia   Accidental fall from bed   Acute encephalopathy   Patient with COPD Gold Stage C admitted with dyspnea and acute hypoxemic resp failure  Assessment and plan  Acute on chronic hypoxemic respiratory failure secondary to left lower lobe pneumonia  Healthcare associated pneumonia  COPD exacerbation  Interstitial lung disease with pulmonary hypertension  - has seen Dr Delford Field as outpt, seen by critical care, currently on a partial nonrebreather, Trying to wean oxygen Discontinue Azithromycin, Azactam, and Vancomycin  - Initiate Imipenem 250 mg IV q6h , patient allergic to penicillin therefore not on cephalosporin - cont wean steroids, nebs and O2 , appreciate pulmonary input On oxygen 2 L at home, currently on venti mask, will transfer if able to titrate his oxygen levels down  Start chest percussion therapy, out of bed to chair  Did not need BiPAP during this hospitalization since admission  Speech therapy evaluation recommends regular diet and thin liquids  Repeat chest x-ray today remains unchanged, slight worsening? Non-scheduled doses of Lasix , patient has diastolic dysfunction on 2-D echo    Chronic stable Diastolic heart failure  Will discontinue IV fluids  Now on scheduled doses of Lasix 2-D echo , patient has diastolic heart failure EF of 65-70%, discussed with the patient's daughter  Hypertension  Increase Norvasc,, increased frequency of hydralazine  BPH  Continue Avodart and Flomax   GERD  continue Protonix   Acute encephalopathy  Fall from bed-follow mental status  - PT eval recommended  SNF   Code Status: full  Family Communication: family updated about patient's clinical progress  Disposition Plan: Attempting to transfer out to step down and oxygenation is better   Brief narrative:  Jim Contreras is a 80 y.o. male with Past medical history of hypertension, COPD with chronic resp failure on chronic o2 3LPM, CVA, interstitial lung disease, BPH, diastolic dysfunction, pulmonary hypertension.  The patient is presenting with complaints of shortness of breath ongoing with last 1 week progressively worsening along with cough with expectoration. He also had fever and chills. He denies any chest pain. He denies any choking episode. He is from an assisted living facility. No recent travel reported. No leg swelling or leg tenderness. No palpitation dizziness lightheadedness nausea vomiting abdominal pain diarrhea constipation burning urination.  Medication has been notified based on the documentation available from the nursing home and no recent change identified.  Initially the history was reported that the patient had a fall twice on my discussion with the nursing home staff today while the patient was coming out of the bed when his wheelchair was not locked, He fell down on the ground, no head injury or neck injury reported.  Also since last 2 days the patient has not been acting himself as per the nursing home staff.  The patient is coming from SNF. And at his baseline independent for most of his ADL.  Consultants:  None Procedures:  None Antibiotics:  Vancomycin aztreonam/azithromycin   HPI/Subjective:  Confused,daughter by the bedside, updated her  Objective: Filed Vitals:   03/25/14 0400 03/25/14 0500 03/25/14 0720 03/25/14 1100  BP: 127/46  158/71   Pulse: 56  53   Temp:   98.6 F (37 C) 97.1 F (36.2 C)  TempSrc:   Axillary Axillary  Resp: 14  20   Height:      Weight:  71.1 kg (156 lb 12 oz)    SpO2: 92%  93%     Intake/Output Summary (Last 24 hours) at  03/25/14 1229 Last data filed at 03/25/14 1100  Gross per 24 hour  Intake  212.5 ml  Output   2075 ml  Net -1862.5 ml    Exam:  General: alert & oriented x 3 In NAD  Cardiovascular: RRR, nl S1 s2  Respiratory: Decreased breath sounds at the bases, scattered rhonchi, no crackles  Abdomen: soft +BS NT/ND, no masses palpable  Extremities: No cyanosis and no edema      Data Reviewed: Basic Metabolic Panel:  Recent Labs Lab 03/21/14 1725 03/22/14 0253 03/25/14 0335  NA 137 140 142  K 4.0 3.8 3.6*  CL 103 108 104  CO2 22 20 25   GLUCOSE 119* 182* 137*  BUN 19 17 31*  CREATININE 1.15 1.13 1.12  CALCIUM 9.2 7.8* 8.3*    Liver Function Tests:  Recent Labs Lab 03/21/14 1725 03/22/14 0253 03/25/14 0335  AST 17 15 22   ALT 9 8 16   ALKPHOS 61 51 41  BILITOT 0.7 0.5 0.3  PROT 7.4 6.2 6.1  ALBUMIN 3.4* 2.7* 2.6*   No results found for this basename: LIPASE, AMYLASE,  in the last 168 hours No results found for this basename: AMMONIA,  in the last 168 hours  CBC:  Recent Labs Lab 03/21/14 1725 03/22/14 0253 03/25/14 0335  WBC 15.6* 12.2* 7.8  NEUTROABS 13.2* 11.6*  --   HGB 13.2 12.5* 12.0*  HCT 40.9 39.4 38.0*  MCV 77.6* 79.0 79.7  PLT 195 171 194    Cardiac Enzymes: No results found for this basename: CKTOTAL, CKMB, CKMBINDEX, TROPONINI,  in the last 168 hours BNP (last 3 results)  Recent Labs  03/24/14 2158  PROBNP 1012.0*     CBG: No results found for this basename: GLUCAP,  in the last 168 hours  Recent Results (from the past 240 hour(s))  CULTURE, BLOOD (ROUTINE X 2)     Status: None   Collection Time    03/21/14  5:25 PM      Result Value Ref Range Status   Specimen Description BLOOD RIGHT FOREARM   Final   Special Requests BOTTLES DRAWN AEROBIC AND ANAEROBIC 5CC   Final   Culture  Setup Time     Final   Value: 03/21/2014 23:02     Performed at [facility]   Culture     Final   Value:        BLOOD CULTURE RECEIVED NO GROWTH  TO DATE CULTURE WILL BE HELD FOR 5 DAYS BEFORE ISSUING A FINAL NEGATIVE REPORT     Performed at [facility]   Report Status PENDING   Incomplete  CULTURE, BLOOD (ROUTINE X 2)     Status: None   Collection Time    03/21/14  5:40 PM      Result Value Ref Range Status   Specimen Description BLOOD LEFT FOREARM   Final   Special Requests BOTTLES DRAWN AEROBIC ONLY 1CC   Final   Culture  Setup Time     Final   Value: 03/21/2014 23:04     Performed at [facility]   Culture     Final   Value:  BLOOD CULTURE RECEIVED NO GROWTH TO DATE CULTURE WILL BE HELD FOR 5 DAYS BEFORE ISSUING A FINAL NEGATIVE REPORT     Performed at [facility]   Report Status PENDING   Incomplete  MRSA PCR SCREENING     Status: None   Collection Time    03/21/14 10:49 PM      Result Value Ref Range Status   MRSA by PCR NEGATIVE  NEGATIVE Final   Comment:            The GeneXpert MRSA Assay (FDA     approved for NASAL specimens     only), is one component of a     comprehensive MRSA colonization     surveillance program. It is not     intended to diagnose MRSA     infection nor to guide or     monitor treatment for     MRSA infections.  URINE CULTURE     Status: None   Collection Time    03/23/14  9:00 PM      Result Value Ref Range Status   Specimen Description URINE, CLEAN CATCH   Final   Special Requests NONE   Final   Culture  Setup Time     Final   Value: 03/23/2014 21:40     Performed at [facility]   Colony Count     Final   Value: NO GROWTH     Performed at [facility]   Culture     Final   Value: NO GROWTH     Performed at [facility]   Report Status 03/25/2014 FINAL   Final     Studies: Ct Head Wo Contrast  03/21/2014   CLINICAL DATA:  Altered mental status after fall.  EXAM: CT HEAD WITHOUT CONTRAST  TECHNIQUE: Contiguous axial images were obtained from the base of the skull through the vertex without intravenous contrast.   COMPARISON:  CT scan of December 31, 2012.  FINDINGS: Bony calvarium appears intact. Diffuse cortical atrophy is noted. Mild chronic ischemic white matter disease is noted. No mass effect or midline shift is noted. Ventricular size is within normal limits. There is no evidence of mass lesion, hemorrhage or acute infarction.  IMPRESSION: Diffuse cortical atrophy. Mild chronic ischemic white matter disease. No acute intracranial abnormality seen.   Electronically Signed   By: Roque LiasJames  Green M.D.   On: 03/21/2014 18:26   Dg Chest Port 1 View  03/24/2014   CLINICAL DATA:  Shortness of Breath.  COPD.  Smoker.  A a seen  EXAM: PORTABLE CHEST - 1 VIEW  COMPARISON:  03/21/2014  FINDINGS: Continued left lower lobe airspace disease is similar prior study. In addition, there is increasing right basilar airspace opacity. Findings concerning for multifocal pneumonia.  Underlying COPD/hyperinflation. Heart is borderline in size. No effusions or acute bony abnormality.  IMPRESSION: Stable left lower lobe airspace disease with worsening right basilar airspace opacity. Findings concerning for worsening multifocal pneumonia.   Electronically Signed   By: Charlett NoseKevin  Dover M.D.   On: 03/24/2014 15:17   Dg Chest Port 1 View  03/21/2014   CLINICAL DATA:  Confusion fever.  Hypertension.  COPD.  Cough.  EXAM: PORTABLE CHEST - 1 VIEW  COMPARISON:  12/31/2012.  03/09/2012  FINDINGS: Midline trachea. Mild cardiomegaly with atherosclerosis in the transverse aorta. No right and no definite left pleural effusion. Lower lobe predominant interstitial thickening is moderate. Right upper lobe scarring. More confluent left lower  lobe patchy airspace disease.  IMPRESSION: Left lower lobe airspace disease, suspicious for infection or aspiration. Progression of interstitial lung disease is felt less likely.  Interstitial lung disease as was detailed on 05/01/2013 high-resolution CT. Considerations include alveolar microlithiasis superimposed upon  nonspecific interstitial pneumonitis.  Cardiomegaly without congestive failure.   Electronically Signed   By: Jeronimo GreavesKyle  Talbot M.D.   On: 03/21/2014 17:58    Scheduled Meds: . amLODipine  5 mg Oral Daily  . antiseptic oral rinse  7 mL Mouth Rinse BID  . aspirin EC  81 mg Oral Daily  . dutasteride  0.5 mg Oral Daily  . enoxaparin (LOVENOX) injection  40 mg Subcutaneous Q24H  . furosemide  40 mg Intravenous Q12H  . guaiFENesin  600 mg Oral BID  . imipenem-cilastatin  250 mg Intravenous 4 times per day  . ipratropium-albuterol  3 mL Nebulization TID  . losartan  50 mg Oral Daily  . pantoprazole  40 mg Oral Daily  . pravastatin  40 mg Oral Daily  . predniSONE  40 mg Oral Q breakfast  . tamsulosin  0.4 mg Oral Daily   Continuous Infusions:   Principal Problem:   Acute on chronic respiratory failure with hypoxemia Active Problems:   COPD with emphysema Gold C    ILD (interstitial lung disease)   Pulmonary hypertension   Hypertension   Collagenous colitis   Pneumonia   Accidental fall from bed   Acute encephalopathy    Time spent: 40 minutes   [facility] Institute[facility]OL,Breckin Zafar  [facility] Pager [phone]. If 7PM-7AM, please contact night-coverage at [email or web address], password [email or web address] 03/25/2014, 12:29 PM  LOS: 4 days

## 2014-03-25 NOTE — Progress Notes (Signed)
PULMONARY  / CRITICAL CARE MEDICINE CONSULTATION   Name: Georgina QuintJames R Yun MRN: [id number] DOB: 10/23/1923    ADMISSION DATE:  03/21/2014 CONSULTATION DATE: 03/24/2014  REQUESTING CLINICIAN: Dr. Susie CassetteAbrol PRIMARY SERVICE: [facility]  CHIEF COMPLAINT:  SOB  BRIEF PATIENT DESCRIPTION: [age] M with COPD/ILD on chronic O2, patient of PW, who presented 10/8 with SOB likely 2/2 COPD exacerbation.  SIGNIFICANT EVENTS / STUDIES:  ABG on 03/21/2014 7.491/28.6/51.0 Chest X-ray 10/11 with worsening bibasilar opacities concerning for pneumonia  LINES / TUBES: PIV  CULTURES: Blood Cultures 03/21/2014 NGTD Urine Culture 03/21/2014 NGTD Flu Panel 10/8 Negative  ANTIBIOTICS: Imipenem 10/8-  Azithro 10/8-10/11 Aztreonam 10/8 - 10/11 Vanc 10/8-10/11  SUBJECTIVE:   VITAL SIGNS: Temp:  [97.3 F (36.3 C)-98.6 F (37 C)] 98.6 F (37 C) (10/12 0720) Pulse Rate:  [53-76] 53 (10/12 0720) Resp:  [14-26] 20 (10/12 0720) BP: (127-158)/(46-71) 158/71 mmHg (10/12 0720) SpO2:  [92 %-100 %] 93 % (10/12 0720) FiO2 (%):  [50 %] 50 % (10/11 1127) Weight:  [71.1 kg (156 lb 12 oz)] 71.1 kg (156 lb 12 oz) (10/12 0500) HEMODYNAMICS:   VENTILATOR SETTINGS: Vent Mode:  [-]  FiO2 (%):  [50 %] 50 % INTAKE / OUTPUT: Intake/Output     10/11 0701 - 10/12 0700 10/12 0701 - 10/13 0700   I.V. (mL/kg)     IV Piggyback 312.5    Total Intake(mL/kg) 312.5 (4.4)    Urine (mL/kg/hr) 2500 (1.5) 175 (0.9)   Total Output 2500 175   Net -2187.5 -175          PHYSICAL EXAMINATION: General:  Elderly male in NAD Neuro:  Grossly intact HEENT:  Sclera anicteric, conjunctiva pink, MMM, OP clear Neck: Trachea supple and midline, (-) LAN or JVD Cardiovascular:  RRR, nS1/S2, (-) MRG Lungs:  Distant with mild rales bilaterally at the bases Abdomen:  S/NT/ND/(+)BS Musculoskeletal:  (-) C/C/E Skin:  Grossly Intact  LABS:  CBC  Recent Labs Lab 03/21/14 1725 03/22/14 0253 03/25/14 0335  WBC 15.6* 12.2* 7.8  HGB 13.2 12.5*  12.0*  HCT 40.9 39.4 38.0*  PLT 195 171 194   Coag's No results found for this basename: APTT, INR,  in the last 168 hours BMET  Recent Labs Lab 03/21/14 1725 03/22/14 0253 03/25/14 0335  NA 137 140 142  K 4.0 3.8 3.6*  CL 103 108 104  CO2 22 20 25   BUN 19 17 31*  CREATININE 1.15 1.13 1.12  GLUCOSE 119* 182* 137*   Electrolytes  Recent Labs Lab 03/21/14 1725 03/22/14 0253 03/25/14 0335  CALCIUM 9.2 7.8* 8.3*   Sepsis Markers  Recent Labs Lab 03/21/14 1747 03/21/14 2030  LATICACIDVEN 1.45 0.58   ABG  Recent Labs Lab 03/21/14 1747  PHART 7.491*  PCO2ART 28.6*  PO2ART 51.0*   Liver Enzymes  Recent Labs Lab 03/21/14 1725 03/22/14 0253 03/25/14 0335  AST 17 15 22   ALT 9 8 16   ALKPHOS 61 51 41  BILITOT 0.7 0.5 0.3  ALBUMIN 3.4* 2.7* 2.6*   Cardiac Enzymes  Recent Labs Lab 03/24/14 2158  PROBNP 1012.0*   Glucose No results found for this basename: GLUCAP,  in the last 168 hours  Imaging Dg Chest Port 1 View  03/24/2014   CLINICAL DATA:  Shortness of Breath.  COPD.  Smoker.  A a seen  EXAM: PORTABLE CHEST - 1 VIEW  COMPARISON:  03/21/2014  FINDINGS: Continued left lower lobe airspace disease is similar prior study. In addition, there is increasing right  basilar airspace opacity. Findings concerning for multifocal pneumonia.  Underlying COPD/hyperinflation. Heart is borderline in size. No effusions or acute bony abnormality.  IMPRESSION: Stable left lower lobe airspace disease with worsening right basilar airspace opacity. Findings concerning for worsening multifocal pneumonia.   Electronically Signed   By: Charlett NoseKevin  Dover M.D.   On: 03/24/2014 15:17    CXR: Personally reviewed. Worsened biltaeral basilar predominant opacities.   ASSESSMENT / PLAN:  1. Acute on Chronic Hypoxic Respiratory Failure: With elevated WBC count on presentation and progressive bibasilar infiltrates.. Pneumonia can also explain the worsened falls noted recently. No active  wheeze. Consider flare ILD, cardiac or non-cardiac edema.   Agree with broad abx, could consider change Imipenem to cephalosporin soon (imipenem is in short supply), would complete a total course of 8 days of antibiotics for HCAP  prednisone 40 mg daily to complete 5 days of steroids  Continue Standing Duonebs and PRN albuterol  Consider empiric diuresis >   TODAY'S SUMMARY:   I have personally obtained a history, examined the patient, evaluated laboratory and imaging results, formulated the assessment and plan and placed orders.   Levy Pupaobert Kazoua Gossen, MD, PhD 03/25/2014, 9:54 AM [facility] [phone] or if no answer [phone]

## 2014-03-25 NOTE — Progress Notes (Signed)
OT Cancellation Note  Patient Details Name: Georgina QuintJames R Arena MRN: [id number] DOB: [birth year]   Cancelled Treatment:    Reason Eval/Treat Not Completed: Patient at procedure or test/ unavailable (IR. will attempt later if able.)  [facility] CenterWARD,HILLARY Sueanne Maniaci, OTR/L  [phone] 03/25/2014 03/25/2014, 12:11 PM

## 2014-03-25 NOTE — Progress Notes (Signed)
Pt. Was given a flutter valve & instructed on how to use it & how often. Pt. Has flutter valve on bedside table & states that he will use it Q4 during the night if he wakes up.

## 2014-03-25 NOTE — Progress Notes (Signed)
Utilization review completed.  

## 2014-03-25 NOTE — Progress Notes (Signed)
Speech Language Pathology Treatment: Dysphagia  Patient Details Name: Jim QuintJames R Guimont MRN: [id number] DOB: 28-May-1924 Today's Date: 03/25/2014 Time: [id number] SLP Time Calculation (min): 15 min  Assessment / Plan / Recommendation Clinical Impression  F/u for dysphagia.  Pt on partial non-rebreather; oxygenating at around 92%.  Lunch tray present.  Pt eating rapidly, attempting to eat quickly while mask is removed, then replacing for one ventilatory cycle, removing mask and consuming multiple bites/sips again.  This led to immediate coughing, desaturation into low 80s, with increased respiratory rate.  Discussed with pt shared pathway of aerodigestive system; the necessity of giving breathing priority over eating; and importance of taking one bite, replacing mask, allowing 5-6 breaths, then single bolus of food again.  When practicing this pattern of slower eating and time for recovery with mask in place, coughing episodes stopped.  Recommend continuing current diet with precautions.  Pt demonstrated understanding - will follow briefly.     HPI HPI: 80 yo male admitted for SOB, productive cough, and confusion with LLL airspace disease per CXR. PMH includes COPD, ILD, GERD, hypertension. Pt had MBS in 2013 with deep, silent penetration to the vocal cords with large sips of thin liquid. SLP recommended a regular diet and thin liquids with small, single sips to increase airway protection.   Pertinent Vitals Pain Assessment: No/denies pain  SLP Plan  Continue with current plan of care    Recommendations Diet recommendations: Regular;Thin liquid Medication Administration: Whole meds with puree Supervision: Patient able to self feed;Intermittent supervision to cue for compensatory strategies Compensations: Slow rate;Small sips/bites (5-6 breaths between each bite) Postural Changes and/or Swallow Maneuvers: Seated upright 90 degrees              Oral Care Recommendations: Oral care BID Follow up  Recommendations: None Plan: Continue with current plan of care    Vedanshi Massaro L. [name], KentuckyMA CCC/SLP Pager [phone]      Blenda MountsCouture, Naser Schuld Laurice 03/25/2014, 1:27 PM

## 2014-03-25 NOTE — Clinical Social Work Psychosocial (Signed)
Clinical Social Work Department BRIEF PSYCHOSOCIAL ASSESSMENT 03/25/2014  Patient:  Jim Contreras, Jim Contreras     Account Number:  [id number]     Admit date:  03/21/2014  Clinical Social Worker:  Marciano Sequin  Date/Time:  03/25/2014 02:58 PM  Referred by:  RN  Date Referred:  03/25/2014 Referred for  SNF Placement   Other Referral:   Interview type:  Patient Other interview type:    PSYCHOSOCIAL DATA Living Status:  FACILITY Admitted from facility:  [facility] Level of care:  Assisted Living Primary support name:  Bergfeld Rogers,Linda Primary support relationship to patient:  CHILD, ADULT Degree of support available:   Strong Support    CURRENT CONCERNS  Other Concerns:    SOCIAL WORK ASSESSMENT / PLAN CSW met pt at bedside. CSW introduce self and purpose of visit. Pt presented with a normal affect and calm mood. Pt reported being in good spirits and being ready to return home. Pt reported being a long-term care resident at [facility]. CSW explained the discharge process to the pt. CSW provided the pt with contact information for further questions. CSW will continue to follow this pt and assist with discharge as needed.   Assessment/plan status:  Information/Referral to [employer] Other assessment/ plan:   Information/referral to community resources:   Return to [place]: Pt agreed to return back to his AFL to received rehab.    [place], MSW, [place]

## 2014-03-25 NOTE — Care Management Note (Signed)
  Page 1 of 1   04/05/2014     1:28:31 PM CARE MANAGEMENT NOTE 04/05/2014  Patient:  Jim Contreras,[name]   Account Number:  [id number]  Date Initiated:  03/25/2014  Documentation initiated by:  Donn PieriniWEBSTER,KRISTI  Subjective/Objective Assessment:   Pt admitted with resp distress/ PNA     Action/Plan:   PTA pt lived at home- PT/OT evals, SNF recommended- CSW consulted   Anticipated DC Date:  04/02/2014   Anticipated DC Plan:  ASSISTED LIVING / REST HOME  In-house referral  Clinical Social Worker      DC Planning Services  CM consult      Choice offered to / List presented to:             Status of service:  In process, will continue to follow Medicare Important Message given?  YES (If response is "NO", the following Medicare IM given date fields will be blank) Date Medicare IM given:  04/05/2014 Medicare IM given by:  Ronny FlurryWILE,Harless Molinari Date Additional Medicare IM given:  03/29/2014 Additional Medicare IM given by:  Donn PieriniKRISTI WEBSTER  Discharge Disposition:    Per UR Regulation:  Reviewed for med. necessity/level of care/duration of stay  If discussed at Long Length of Stay Meetings, dates discussed:   03/26/2014  03/28/2014  04/02/2014  04/04/2014    Comments:  04-05-14 [facility] and 3 n 1 orders be faxed to her and she will arrange . Faxed same to [phone] and [phone]. Ronny FlurryHeather Hersel Mcmeen RN BSN    03/25/14- 1400- Donn PieriniKristi Webster RN, BSN [phone] Pt continues on VM vs NRB- attempting weaning- CSW consulted for SNF placement

## 2014-03-25 NOTE — Progress Notes (Signed)
Physical Therapy Treatment Patient Details Name: Jim Contreras MRN: [id number] DOB: 1923/12/29 Today's Date: 03/25/2014    History of Present Illness 80 y.o. male with Past medical history of hypertension, COPD with chronic resp failure on chronic o2 3LPM, CVA, interstitial lung disease, BPH, diastolic dysfunction, pulmonary hypertension.   Pt adm due to worsening of SOB. Pt with Acute on chronic respiratory failure with hypoxemia.     PT Comments    Pt pt progressing with therapy, tolerated 16' of ambulation with RW and +2 min A. O2 sats as low as 87% on NRB, increased back to 94% after 3 min seated rest. PT will continue to follow.   Follow Up Recommendations  SNF     Equipment Recommendations  Rolling walker with 5" wheels    Recommendations for Other Services OT consult     Precautions / Restrictions Precautions Precautions: Fall Precaution Comments: monitor O2 sats Restrictions Weight Bearing Restrictions: No    Mobility  Bed Mobility Overal bed mobility: Needs Assistance Bed Mobility: Supine to Sit     Supine to sit: Supervision;HOB elevated     General bed mobility comments: pt pivoted to EOB with HOB ~30 degrees and scooted out to edge with supervision but needed increased time and vc's to wt shift to get self to edge  Transfers Overall transfer level: Needs assistance Equipment used: Rolling walker (2 wheeled) Transfers: Sit to/from Stand Sit to Stand: +2 safety/equipment;Mod assist         General transfer comment: pt able to power self up with hands on RW and RW stabilized (despite vc's for hands on bed) but strong post lean with mod A to prevent post LOB with sit back to bed, +1 for safety and mgmt of lines. vc's for reaching to chair before sitting, pt did follow this cue  Ambulation/Gait Ambulation/Gait assistance: Mod assist;+2 safety/equipment Ambulation Distance (Feet): 16 Feet Assistive device: Rolling walker (2 wheeled) Gait  Pattern/deviations: Step-through pattern;Decreased step length - right;Decreased step length - left;Narrow base of support Gait velocity: decreased   General Gait Details: pt unfamiliar with RW, vc's for mgmt, pt continuing to lean posterior with manual facilitation of fwd wt shift. Difficulty turning RW, +1 for lines and chair close behind. O2 sats as low as 87% on NRB   Stairs            Wheelchair Mobility    Modified Rankin (Stroke Patients Only)       Balance Overall balance assessment: Needs assistance;History of Falls Sitting-balance support: Single extremity supported;Feet supported Sitting balance-Leahy Scale: Fair Sitting balance - Comments: pt able to maintain balance sitting EOB without only one hand for support Postural control: Posterior lean Standing balance support: Bilateral upper extremity supported;During functional activity Standing balance-Leahy Scale: Poor Standing balance comment: continues with post lean but able to ambulate with min A and RW                    Cognition Arousal/Alertness: Awake/alert Behavior During Therapy: WFL for tasks assessed/performed Overall Cognitive Status: Within Functional Limits for tasks assessed                      Exercises General Exercises - Lower Extremity Ankle Circles/Pumps: AROM;Both;10 reps;Seated Heel Slides: AROM;Both;10 reps;Seated Straight Leg Raises: AROM;Both;10 reps;Seated    General Comments        Pertinent Vitals/Pain Pain Assessment: No/denies pain See assessment above    Home Living  Prior Function            PT Goals (current goals can now be found in the care plan section) Acute Rehab PT Goals Patient Stated Goal: to get where i was at rehab PT Goal Formulation: With patient Time For Goal Achievement: 03/31/14 Potential to Achieve Goals: Fair Progress towards PT goals: Progressing toward goals    Frequency  Min 3X/week    PT  Plan Current plan remains appropriate    Co-evaluation             End of Session Equipment Utilized During Treatment: Gait belt;Oxygen Activity Tolerance: Patient limited by fatigue Patient left: in chair;with call bell/phone within reach     Time: [id number] PT Time Calculation (min): 19 min  Charges:  $Therapeutic Activity: 8-22 mins                    G Codes:     Lyanne CoVictoria Demetres Prochnow, PT  Acute Rehab Services  [phone]  Lyanne CoManess, Yuuki Skeens 03/25/2014, 12:24 PM

## 2014-03-26 ENCOUNTER — Inpatient Hospital Stay (HOSPITAL_COMMUNITY): Payer: Medicare Other

## 2014-03-26 DIAGNOSIS — J9600 Acute respiratory failure, unspecified whether with hypoxia or hypercapnia: Secondary | ICD-10-CM

## 2014-03-26 DIAGNOSIS — J849 Interstitial pulmonary disease, unspecified: Secondary | ICD-10-CM

## 2014-03-26 LAB — BASIC METABOLIC PANEL
Anion gap: 12 (ref 5–15)
BUN: 31 mg/dL — AB (ref 6–23)
CO2: 30 mEq/L (ref 19–32)
Calcium: 8.5 mg/dL (ref 8.4–10.5)
Chloride: 100 mEq/L (ref 96–112)
Creatinine, Ser: 0.97 mg/dL (ref 0.50–1.35)
GFR, EST AFRICAN AMERICAN: 83 mL/min — AB (ref >90)
GFR, EST NON AFRICAN AMERICAN: 72 mL/min — AB (ref >90)
Glucose, Bld: 103 mg/dL — ABNORMAL HIGH (ref 70–99)
POTASSIUM: 3.5 meq/L — AB (ref 3.7–5.3)
SODIUM: 142 meq/L (ref 137–147)

## 2014-03-26 LAB — MAGNESIUM: Magnesium: 2.3 mg/dL (ref 1.5–2.5)

## 2014-03-26 LAB — PHOSPHORUS: PHOSPHORUS: 2.7 mg/dL (ref 2.3–4.6)

## 2014-03-26 MED ORDER — ENOXAPARIN SODIUM 40 MG/0.4ML ~~LOC~~ SOLN
40.0000 mg | SUBCUTANEOUS | Status: DC
  Administered 2014-03-27 – 2014-04-05 (×10): 40 mg via SUBCUTANEOUS
  Filled 2014-03-26 (×10): qty 0.4

## 2014-03-26 MED ORDER — IOHEXOL 350 MG/ML SOLN
80.0000 mL | Freq: Once | INTRAVENOUS | Status: AC | PRN
  Administered 2014-03-26: 62 mL via INTRAVENOUS

## 2014-03-26 MED ORDER — HEPARIN (PORCINE) IN NACL 100-0.45 UNIT/ML-% IJ SOLN
1250.0000 [IU]/h | INTRAMUSCULAR | Status: DC
  Filled 2014-03-26: qty 250

## 2014-03-26 NOTE — Progress Notes (Signed)
PULMONARY  / CRITICAL CARE MEDICINE CONSULTATION   Name: Jim Contreras MRN: [id number] DOB: March 25, 1924    ADMISSION DATE:  03/21/2014 CONSULTATION DATE: 03/24/2014  REQUESTING CLINICIAN: Dr. Susie CassetteAbrol PRIMARY SERVICE: TRH  CHIEF COMPLAINT:  SOB  BRIEF PATIENT DESCRIPTION: [age] M with COPD/ILD on chronic O2, patient of PW, who presented 10/8 with SOB likely 2/2 COPD exacerbation.  SIGNIFICANT EVENTS / STUDIES:  ABG on 03/21/2014 7.491/28.6/51.0 Chest X-ray 10/11 with worsening bibasilar opacities concerning for pneumonia  LINES / TUBES: PIV  CULTURES: Blood Cultures 03/21/2014 NGTD Urine Culture 03/21/2014 NGTD Flu Panel 10/8 Negative  ANTIBIOTICS: Imipenem 10/8-  Azithro 10/8-10/11 Aztreonam 10/8 - 10/11 Vanc 10/8-10/11  SUBJECTIVE:   No real change last 24 hours, did diurese, is net negative  VITAL SIGNS: Temp:  [97.1 F (36.2 C)-98.4 F (36.9 C)] 97.8 F (36.6 C) (10/13 0718) Pulse Rate:  [59-73] 59 (10/13 0519) Resp:  [16-23] 16 (10/13 0519) BP: (124-155)/(65-77) 141/65 mmHg (10/13 0519) SpO2:  [91 %-100 %] 97 % (10/13 0817) Weight:  [70 kg (154 lb 5.2 oz)] 70 kg (154 lb 5.2 oz) (10/13 0500) HEMODYNAMICS:   VENTILATOR SETTINGS:   INTAKE / OUTPUT: Intake/Output     10/12 0701 - 10/13 0700 10/13 0701 - 10/14 0700   IV Piggyback 400    Total Intake(mL/kg) 400 (5.7)    Urine (mL/kg/hr) 2750 (1.6) 100 (0.4)   Total Output 2750 100   Net -2350 -100        Urine Occurrence 1 x      PHYSICAL EXAMINATION: General:  Elderly male in NAD Neuro:  Grossly intact HEENT:  Sclera anicteric, conjunctiva pink, MMM, OP clear Neck: Trachea supple and midline, (-) LAN or JVD Cardiovascular:  RRR, nS1/S2, (-) MRG Lungs:  Distant with mild rales bilaterally at the bases Abdomen:  S/NT/ND/(+)BS Musculoskeletal:  (-) C/C/E Skin:  Grossly Intact  LABS:  CBC  Recent Labs Lab 03/21/14 1725 03/22/14 0253 03/25/14 0335  WBC 15.6* 12.2* 7.8  HGB 13.2 12.5* 12.0*  HCT  40.9 39.4 38.0*  PLT 195 171 194   Coag's No results found for this basename: APTT, INR,  in the last 168 hours BMET  Recent Labs Lab 03/22/14 0253 03/25/14 0335 03/26/14 0232  NA 140 142 142  K 3.8 3.6* 3.5*  CL 108 104 100  CO2 20 25 30   BUN 17 31* 31*  CREATININE 1.13 1.12 0.97  GLUCOSE 182* 137* 103*   Electrolytes  Recent Labs Lab 03/22/14 0253 03/25/14 0335 03/26/14 0232  CALCIUM 7.8* 8.3* 8.5  MG  --   --  2.3  PHOS  --   --  2.7   Sepsis Markers  Recent Labs Lab 03/21/14 1747 03/21/14 2030  LATICACIDVEN 1.45 0.58   ABG  Recent Labs Lab 03/21/14 1747  PHART 7.491*  PCO2ART 28.6*  PO2ART 51.0*   Liver Enzymes  Recent Labs Lab 03/21/14 1725 03/22/14 0253 03/25/14 0335  AST 17 15 22   ALT 9 8 16   ALKPHOS 61 51 41  BILITOT 0.7 0.5 0.3  ALBUMIN 3.4* 2.7* 2.6*   Cardiac Enzymes  Recent Labs Lab 03/24/14 2158  PROBNP 1012.0*   Glucose No results found for this basename: GLUCAP,  in the last 168 hours  Imaging Nm Pulmonary Perf And Vent  03/25/2014   CLINICAL DATA:  Acute on chronic respiratory failure. Hypoxemia. COPD. Emphysema. Encephalopathy.  EXAM: NUCLEAR MEDICINE VENTILATION - PERFUSION LUNG SCAN  TECHNIQUE: Ventilation images were obtained in multiple projections using  inhaled aerosol technetium 99 M DTPA. Perfusion images were obtained in multiple projections after intravenous injection of Tc-6338m MAA.  RADIOPHARMACEUTICALS:  6.0 mCi Tc-3638m DTPA aerosol and 40 mCi Tc-3638m MAA  COMPARISON:  03/24/2014  FINDINGS: Ventilation: Abnormal accumulation of radiotracer medially near the hilum in both lungs. Hypoventilation at the right lung base and in the lingula. Hypoventilation in the lung apices.  Perfusion: The patient was apparently unable to raise the right arm, leading to a photopenic defect on the lateral projection. Patchy moderate-sized perfusion defect posteriorly in the right lung corresponding to radiographic and ventilation  abnormalities. Patchy hypoperfusion in both upper lobes.  IMPRESSION: 1. Intermediate probability of pulmonary embolus (risk 20-79%) due to triple matched large segmental defects in the lower lung zone and multiple moderate to large matched the defects in the upper lungs. There is known airspace opacity in the lung bases as well as severe emphysema especially affecting the upper lobes.   Electronically Signed   By: Herbie BaltimoreWalt  Liebkemann M.D.   On: 03/25/2014 13:22   Dg Chest Port 1 View  03/24/2014   CLINICAL DATA:  Shortness of Breath.  COPD.  Smoker.  A a seen  EXAM: PORTABLE CHEST - 1 VIEW  COMPARISON:  03/21/2014  FINDINGS: Continued left lower lobe airspace disease is similar prior study. In addition, there is increasing right basilar airspace opacity. Findings concerning for multifocal pneumonia.  Underlying COPD/hyperinflation. Heart is borderline in size. No effusions or acute bony abnormality.  IMPRESSION: Stable left lower lobe airspace disease with worsening right basilar airspace opacity. Findings concerning for worsening multifocal pneumonia.   Electronically Signed   By: Charlett NoseKevin  Dover M.D.   On: 03/24/2014 15:17    CXR: Personally reviewed. Worsened biltaeral basilar predominant opacities.   ASSESSMENT / PLAN:  1. Acute on Chronic Hypoxic Respiratory Failure: With elevated WBC count on presentation and progressive bibasilar infiltrates.. Pneumonia can also explain the worsened falls noted recently. No active wheeze. Consider flare ILD, cardiac or non-cardiac edema.   Agree with broad abx, could consider change Imipenem to cephalosporin soon (imipenem is in short supply), would complete a total course of 8 days of antibiotics for HCAP  prednisone 40 mg daily to complete 5 days  Continue Standing Duonebs and PRN albuterol  Dose lasix daily as he can tolerate.   TODAY'S SUMMARY:   I have personally obtained a history, examined the patient, evaluated laboratory and imaging results,  formulated the assessment and plan and placed orders.   Jim Pupaobert Byrum, MD, PhD 03/26/2014, 10:38 AM [facility] [phone] or if no answer [phone]

## 2014-03-26 NOTE — Progress Notes (Signed)
*  Preliminary Results* Bilateral lower extremity venous duplex completed. Bilateral lower extremities are negative for deep vein thrombosis. There is no evidence of Baker's cyst bilaterally.  03/26/2014  Gertie FeyMichelle Khara Renaud, RVT, RDCS, RDMS

## 2014-03-26 NOTE — Progress Notes (Addendum)
[facility] PROGRESS NOTE  Jim QuintJames R Wheller UJW:[id number] DOB: 1923-07-26 DOA: 03/21/2014 PCP: Laurell JosephsMorrow, Aaron P, MD  Assessment/Plan: Principal Problem:   Acute on chronic respiratory failure with hypoxemia Active Problems:   COPD with emphysema Gold C    ILD (interstitial lung disease)   Pulmonary hypertension   Hypertension   Collagenous colitis   Pneumonia   Accidental fall from bed   Acute encephalopathy    Patient with COPD Gold Stage C admitted with dyspnea and acute hypoxemic resp failure    Assessment and plan  Acute on chronic hypoxemic respiratory failure secondary to left lower lobe pneumonia  Healthcare associated pneumonia  COPD exacerbation  Interstitial lung disease with pulmonary hypertension  - has seen Dr Delford FieldWright as outpt, pulmonary service following on a daily basis, currently still on a partial nonrebreather,  Trying to wean oxygen ,  Discontinue Azithromycin, Azactam, and Vancomycin on 10/11 We switched him to Imipenem 250 mg IV q6h , patient allergic to penicillin therefore cannot get cephalosporin  - cont wean steroids, nebs and O2 , appreciate pulmonary input  On oxygen 2 L at home, currently on venti mask, will transfer to telemetry if able to titrate his oxygen levels down  Continue chest percussion therapy, out of bed to chair  Did not need BiPAP during this hospitalization since admission  Speech therapy evaluation recommends regular diet and thin liquids  Repeat chest x-ray today remains unchanged, slight worsening?  Hold Lasix , today as the patient will need IV contrast for CTA VQ scan intermediate probability, start the patient on the heparin drip, discontinue if CT angiography negative for PE Therefore have ordered a CTA to rule out PE Venous Doppler of the bilateral lower extremities pending  Hypokalemia replete  Chronic stable Diastolic heart failure  Discontinued IV hydration Now on scheduled doses of Lasix  2-D echo , showed  diastolic heart failure EF of 65-70%, discussed with the patient's daughter   Hypertension  Increase Norvasc,, increased frequency of hydralazine   BPH  Continue Avodart and Flomax   GERD  continue Protonix   Acute encephalopathy  Fall from bed prior to admission-follow mental status  - PT eval recommended SNF  Follow precautions  Code Status: full  Family Communication: family updated about patient's clinical progress  Disposition Plan: Attempting to transfer out to step down and oxygenation is better , eventually will need SNF   Brief narrative:  Jim Contreras is a 80 y.o. male with Past medical history of hypertension, COPD with chronic resp failure on chronic o2 3LPM, CVA, interstitial lung disease, BPH, diastolic dysfunction, pulmonary hypertension.  The patient is presenting with complaints of shortness of breath ongoing with last 1 week progressively worsening along with cough with expectoration. He also had fever and chills. He denies any chest pain. He denies any choking episode. He is from an assisted living facility. No recent travel reported. No leg swelling or leg tenderness. No palpitation dizziness lightheadedness nausea vomiting abdominal pain diarrhea constipation burning urination.  Medication has been notified based on the documentation available from the nursing home and no recent change identified.  Initially the history was reported that the patient had a fall twice on my discussion with the nursing home staff today while the patient was coming out of the bed when his wheelchair was not locked, He fell down on the ground, no head injury or neck injury reported.  Also since last 2 days the patient has not been acting himself as per  the nursing home staff.  The patient is coming from SNF. And at his baseline independent for most of his ADL.  Consultants:  None Procedures:  None Antibiotics:  Vancomycin aztreonam/azithromycin discontinued None switch to imipenem  on 10/11  HPI/Subjective: Patient had a rough night, condom catheter came off  Objective: Filed Vitals:   03/26/14 0500 03/26/14 0519 03/26/14 0718 03/26/14 0817  BP:  141/65    Pulse:  59    Temp:  97.7 F (36.5 C) 97.8 F (36.6 C)   TempSrc:  Oral Oral   Resp:  16    Height:      Weight: 70 kg (154 lb 5.2 oz)     SpO2:  96%  97%    Intake/Output Summary (Last 24 hours) at 03/26/14 1149 Last data filed at 03/26/14 1610  Gross per 24 hour  Intake    300 ml  Output   2375 ml  Net  -2075 ml    Exam:  General: Elderly male in NAD  Neuro: Grossly intact  HEENT: Sclera anicteric, conjunctiva pink, MMM, OP clear  Neck: Trachea supple and midline, (-) LAN or JVD  Cardiovascular: RRR, nS1/S2, (-) MRG  Lungs: Distant with mild rales bilaterally at the bases  Abdomen: S/NT/ND/(+)BS  Musculoskeletal: (-) C/C/E  Skin: Grossly Intact      Data Reviewed: Basic Metabolic Panel:  Recent Labs Lab 03/21/14 1725 03/22/14 0253 03/25/14 0335 03/26/14 0232  NA 137 140 142 142  K 4.0 3.8 3.6* 3.5*  CL 103 108 104 100  CO2 22 20 25 30   GLUCOSE 119* 182* 137* 103*  BUN 19 17 31* 31*  CREATININE 1.15 1.13 1.12 0.97  CALCIUM 9.2 7.8* 8.3* 8.5  MG  --   --   --  2.3  PHOS  --   --   --  2.7    Liver Function Tests:  Recent Labs Lab 03/21/14 1725 03/22/14 0253 03/25/14 0335  AST 17 15 22   ALT 9 8 16   ALKPHOS 61 51 41  BILITOT 0.7 0.5 0.3  PROT 7.4 6.2 6.1  ALBUMIN 3.4* 2.7* 2.6*   No results found for this basename: LIPASE, AMYLASE,  in the last 168 hours No results found for this basename: AMMONIA,  in the last 168 hours  CBC:  Recent Labs Lab 03/21/14 1725 03/22/14 0253 03/25/14 0335  WBC 15.6* 12.2* 7.8  NEUTROABS 13.2* 11.6*  --   HGB 13.2 12.5* 12.0*  HCT 40.9 39.4 38.0*  MCV 77.6* 79.0 79.7  PLT 195 171 194    Cardiac Enzymes: No results found for this basename: CKTOTAL, CKMB, CKMBINDEX, TROPONINI,  in the last 168 hours BNP (last 3  results)  Recent Labs  03/24/14 2158  PROBNP 1012.0*     CBG: No results found for this basename: GLUCAP,  in the last 168 hours  Recent Results (from the past 240 hour(s))  CULTURE, BLOOD (ROUTINE X 2)     Status: None   Collection Time    03/21/14  5:25 PM      Result Value Ref Range Status   Specimen Description BLOOD RIGHT FOREARM   Final   Special Requests BOTTLES DRAWN AEROBIC AND ANAEROBIC 5CC   Final   Culture  Setup Time     Final   Value: 03/21/2014 23:02     Performed at [facility]   Culture     Final   Value:        BLOOD CULTURE RECEIVED  NO GROWTH TO DATE CULTURE WILL BE HELD FOR 5 DAYS BEFORE ISSUING A FINAL NEGATIVE REPORT     Performed at [facility]   Report Status PENDING   Incomplete  CULTURE, BLOOD (ROUTINE X 2)     Status: None   Collection Time    03/21/14  5:40 PM      Result Value Ref Range Status   Specimen Description BLOOD LEFT FOREARM   Final   Special Requests BOTTLES DRAWN AEROBIC ONLY 1CC   Final   Culture  Setup Time     Final   Value: 03/21/2014 23:04     Performed at [facility]   Culture     Final   Value:        BLOOD CULTURE RECEIVED NO GROWTH TO DATE CULTURE WILL BE HELD FOR 5 DAYS BEFORE ISSUING A FINAL NEGATIVE REPORT     Performed at [facility]   Report Status PENDING   Incomplete  MRSA PCR SCREENING     Status: None   Collection Time    03/21/14 10:49 PM      Result Value Ref Range Status   MRSA by PCR NEGATIVE  NEGATIVE Final   Comment:            The GeneXpert MRSA Assay (FDA     approved for NASAL specimens     only), is one component of a     comprehensive MRSA colonization     surveillance program. It is not     intended to diagnose MRSA     infection nor to guide or     monitor treatment for     MRSA infections.  URINE CULTURE     Status: None   Collection Time    03/23/14  9:00 PM      Result Value Ref Range Status   Specimen Description URINE, CLEAN CATCH   Final    Special Requests NONE   Final   Culture  Setup Time     Final   Value: 03/23/2014 21:40     Performed at [facility] Count     Final   Value: NO GROWTH     Performed at [facility]   Culture     Final   Value: NO GROWTH     Performed at [facility]   Report Status 03/25/2014 FINAL   Final     Studies: Ct Head Wo Contrast  03/21/2014   CLINICAL DATA:  Altered mental status after fall.  EXAM: CT HEAD WITHOUT CONTRAST  TECHNIQUE: Contiguous axial images were obtained from the base of the skull through the vertex without intravenous contrast.  COMPARISON:  CT scan of December 31, 2012.  FINDINGS: Bony calvarium appears intact. Diffuse cortical atrophy is noted. Mild chronic ischemic white matter disease is noted. No mass effect or midline shift is noted. Ventricular size is within normal limits. There is no evidence of mass lesion, hemorrhage or acute infarction.  IMPRESSION: Diffuse cortical atrophy. Mild chronic ischemic white matter disease. No acute intracranial abnormality seen.   Electronically Signed   By: Roque Lias M.D.   On: 03/21/2014 18:26   Nm Pulmonary Perf And Vent  03/25/2014   CLINICAL DATA:  Acute on chronic respiratory failure. Hypoxemia. COPD. Emphysema. Encephalopathy.  EXAM: NUCLEAR MEDICINE VENTILATION - PERFUSION LUNG SCAN  TECHNIQUE: Ventilation images were obtained in multiple projections using inhaled aerosol technetium 99 M DTPA. Perfusion images  were obtained in multiple projections after intravenous injection of Tc-603m MAA.  RADIOPHARMACEUTICALS:  6.0 mCi Tc-153m DTPA aerosol and 40 mCi Tc-363m MAA  COMPARISON:  03/24/2014  FINDINGS: Ventilation: Abnormal accumulation of radiotracer medially near the hilum in both lungs. Hypoventilation at the right lung base and in the lingula. Hypoventilation in the lung apices.  Perfusion: The patient was apparently unable to raise the right arm, leading to a photopenic defect on the lateral projection.  Patchy moderate-sized perfusion defect posteriorly in the right lung corresponding to radiographic and ventilation abnormalities. Patchy hypoperfusion in both upper lobes.  IMPRESSION: 1. Intermediate probability of pulmonary embolus (risk 20-79%) due to triple matched large segmental defects in the lower lung zone and multiple moderate to large matched the defects in the upper lungs. There is known airspace opacity in the lung bases as well as severe emphysema especially affecting the upper lobes.   Electronically Signed   By: Herbie BaltimoreWalt  Liebkemann M.D.   On: 03/25/2014 13:22   Dg Chest Port 1 View  03/24/2014   CLINICAL DATA:  Shortness of Breath.  COPD.  Smoker.  A a seen  EXAM: PORTABLE CHEST - 1 VIEW  COMPARISON:  03/21/2014  FINDINGS: Continued left lower lobe airspace disease is similar prior study. In addition, there is increasing right basilar airspace opacity. Findings concerning for multifocal pneumonia.  Underlying COPD/hyperinflation. Heart is borderline in size. No effusions or acute bony abnormality.  IMPRESSION: Stable left lower lobe airspace disease with worsening right basilar airspace opacity. Findings concerning for worsening multifocal pneumonia.   Electronically Signed   By: Charlett NoseKevin  Dover M.D.   On: 03/24/2014 15:17   Dg Chest Port 1 View  03/21/2014   CLINICAL DATA:  Confusion fever.  Hypertension.  COPD.  Cough.  EXAM: PORTABLE CHEST - 1 VIEW  COMPARISON:  12/31/2012.  03/09/2012  FINDINGS: Midline trachea. Mild cardiomegaly with atherosclerosis in the transverse aorta. No right and no definite left pleural effusion. Lower lobe predominant interstitial thickening is moderate. Right upper lobe scarring. More confluent left lower lobe patchy airspace disease.  IMPRESSION: Left lower lobe airspace disease, suspicious for infection or aspiration. Progression of interstitial lung disease is felt less likely.  Interstitial lung disease as was detailed on 05/01/2013 high-resolution CT.  Considerations include alveolar microlithiasis superimposed upon nonspecific interstitial pneumonitis.  Cardiomegaly without congestive failure.   Electronically Signed   By: Jeronimo GreavesKyle  Talbot M.D.   On: 03/21/2014 17:58    Scheduled Meds: . amLODipine  10 mg Oral Daily  . antiseptic oral rinse  7 mL Mouth Rinse BID  . aspirin EC  81 mg Oral Daily  . dutasteride  0.5 mg Oral Daily  . enoxaparin (LOVENOX) injection  40 mg Subcutaneous Q24H  . guaiFENesin  600 mg Oral BID  . imipenem-cilastatin  250 mg Intravenous 4 times per day  . ipratropium-albuterol  3 mL Nebulization TID  . losartan  50 mg Oral Daily  . pantoprazole  40 mg Oral Daily  . potassium chloride  20 mEq Oral BID  . pravastatin  40 mg Oral Daily  . predniSONE  40 mg Oral Q breakfast  . tamsulosin  0.4 mg Oral Daily   Continuous Infusions:   Principal Problem:   Acute on chronic respiratory failure with hypoxemia Active Problems:   COPD with emphysema Gold C    ILD (interstitial lung disease)   Pulmonary hypertension   Hypertension   Collagenous colitis   Pneumonia   Accidental fall from bed  Acute encephalopathy    Time spent: 40 minutes   [facility]  [facility] Pager [phone]. If 7PM-7AM, please contact night-coverage at [email or web address], password [facility] 03/26/2014, 11:49 AM  LOS: 5 days

## 2014-03-26 NOTE — Progress Notes (Signed)
Speech Language Pathology Treatment: Dysphagia  Patient Details Name: Jim Contreras MRN: [id number] DOB: [birth year] Today's Date: 03/26/2014 Time: 1000-1009 SLP Time Calculation (min): 9 min  Assessment / Plan / Recommendation Clinical Impression  F/u for swallowing.  Concerned re: PO toleration. Pt inconsistently following precautions for measured, slow intake and allowance for adequate respiration.  Coughing observed in correlation with water consumption today, and nurse tech states she removed his dinner tray last night due to the constant coughing.  Chest X-ray with worsening bibasilar opacities concerning for pneumonia.  Discussed with pt changing diet to dysphagia 3, nectar-thick liquids (which he tolerates better than thins at bedside) and proceeding with MBS tomorrow to determine better methods for airway protection.  He agrees.         HPI HPI: 80 yo male admitted for SOB, productive cough, and confusion with LLL airspace disease per CXR. PMH includes COPD, ILD, GERD, hypertension. Pt had MBS in 2013 with deep, silent penetration to the vocal cords with large sips of thin liquid. SLP recommended a regular diet and thin liquids with small, single sips to increase airway protection.   Pertinent Vitals Pain Assessment: No/denies pain  SLP Plan  Continue with current plan of care (consider repeat MBS vs FEES)    Recommendations Diet recommendations: Dysphagia 3 (mechanical soft);Nectar-thick liquid Liquids provided via: Cup Medication Administration: Whole meds with puree Supervision: Patient able to self feed;Intermittent supervision to cue for compensatory strategies Compensations: Slow rate;Small sips/bites Postural Changes and/or Swallow Maneuvers: Seated upright 90 degrees              Oral Care Recommendations: Oral care BID Follow up Recommendations:  (tba) Plan: Continue with current plan of care (consider repeat MBS vs FEES)    Jim Contreras, [place]MA CCC/SLP Pager  [phone]      Blenda Contreras, Jim Pree Laurice 03/26/2014, 2:57 PM

## 2014-03-26 NOTE — Progress Notes (Signed)
Occupational Therapy Evaluation Patient Details Name: Jim Contreras R Gockley MRN: [id number] DOB: Oct 17, 1923 Today's Date: 03/26/2014    History of Present Illness 80 y.o. male with Past medical history of hypertension, COPD with chronic resp failure on chronic o2 3LPM, CVA, interstitial lung disease, BPH, diastolic dysfunction, pulmonary hypertension.   Pt adm due to worsening of SOB. Pt with Acute on chronic respiratory failure with hypoxemia.    Clinical Impression   PTA, pt was @ SNF for rehab. Pt presents with decline in function as compared to his baseline. Pt will need to return for additional rehab at SNF. Pt will benefit from skilled OT services to facilitate D/C to next venue due to below deficits. Pt is very motivated to regain strength and increase his level of independence.     Follow Up Recommendations  SNF;Supervision/Assistance - 24 hour    Equipment Recommendations  None recommended by OT    Recommendations for Other Services       Precautions / Restrictions Precautions Precautions: Fall Precaution Comments: monitor O2 sats      Mobility Bed Mobility Overal bed mobility: Needs Assistance;+2 for physical assistance Bed Mobility: Sit to Supine       Sit to supine: Mod assist;+2 for physical assistance   General bed mobility comments: assisted with nursing  Transfers                 General transfer comment: not assessed this session as pt just returned to bed from chair. Mod A +2     Balance         Postural control: Posterior lean   Standing balance-Leahy Scale: Poor Standing balance comment: unable to achieve full upright posture                            ADL Overall ADL's : Needs assistance/impaired Eating/Feeding: Supervision/ safety Eating/Feeding Details (indicate cue type and reason): nectar thick liquids Grooming: Set up;Sitting   Upper Body Bathing: Set up;Supervision/ safety;Sitting;Bed level   Lower Body Bathing:  Moderate assistance;Bed level;Sitting/lateral leans   Upper Body Dressing : Moderate assistance;Bed level;Sitting   Lower Body Dressing: Moderate assistance;Bed level;Sitting/lateral leans               Functional mobility during ADLs:  (not assessed this session due to fatigue. ) General ADL Comments: Pt with increased independence with ADL @ bed level using lateral leans/rolling. O2 remained in 90s 15L NRB. Pt states he had "graduated" from OT at the facility PTA.     Vision    wears glasses                 Perception     Praxis      Pertinent Vitals/Pain Pain Assessment: 0-10 Pain Score: 2  Pain Location: R side Pain Descriptors / Indicators: Aching Pain Intervention(s): Limited activity within patient's tolerance     Hand Dominance Right   Extremity/Trunk Assessment Upper Extremity Assessment Upper Extremity Assessment: Generalized weakness   Lower Extremity Assessment Lower Extremity Assessment: Generalized weakness   Cervical / Trunk Assessment Cervical / Trunk Assessment: Kyphotic   Communication Communication Communication: HOH   Cognition Arousal/Alertness: Awake/alert Behavior During Therapy: WFL for tasks assessed/performed Overall Cognitive Status: No family/caregiver present to determine baseline cognitive functioning       Memory:  (not oriented to correct ime. "november")             General Comments  Exercises Exercises: Other exercises Other Exercises Other Exercises: encouraged BUE AROM an BLE AROM @ bed level within tolerance   Shoulder Instructions      Home Living Family/patient expects to be discharged to:: Skilled nursing facility                                 Additional Comments: Pt from SNF where he has been for rehab per pt - pt unable ot remember name of facility      Prior Functioning/Environment Level of Independence: Needs assistance  Gait / Transfers Assistance Needed: pt has been  using RW and wheelchair at SNF; reports he could stand and get to wheelchair independently  ADL's / Homemaking Assistance Needed: pt states he was mod I with ADL at facility Communication / Swallowing Assistance Needed: nectar liquids Comments: pt giving different information when compared to PT eval    OT Diagnosis: Generalized weakness   OT Problem List: Decreased strength;Decreased activity tolerance;Impaired balance (sitting and/or standing);Decreased safety awareness;Cardiopulmonary status limiting activity   OT Treatment/Interventions: Self-care/ADL training;Therapeutic exercise;Energy conservation;Therapeutic activities;Patient/family education;Balance training    OT Goals(Current goals can be found in the care plan section) Acute Rehab OT Goals Patient Stated Goal: to eventually retun home to live OT Goal Formulation: With patient Time For Goal Achievement: 04/09/14 Potential to Achieve Goals: Good  OT Frequency: Min 2X/week   Barriers to D/C:            Co-evaluation              End of Session Equipment Utilized During Treatment: Oxygen (15L NRB) Nurse Communication: Mobility status  Activity Tolerance: Patient limited by fatigue Patient left: in bed;with call bell/phone within reach;with bed alarm set   Time: [id number] OT Time Calculation (min): 14 min Charges:  OT General Charges $OT Visit: 1 Procedure OT Evaluation $Initial OT Evaluation Tier I: 1 Procedure OT Treatments $Therapeutic Activity: 8-22 mins G-Codes:    Naida Escalante,HILLARY 03/26/2014, 3:34 PM   [facility] Zykia [place], OTR/L  [phone] 03/26/2014

## 2014-03-27 ENCOUNTER — Inpatient Hospital Stay (HOSPITAL_COMMUNITY): Payer: Medicare Other

## 2014-03-27 LAB — COMPREHENSIVE METABOLIC PANEL
ALBUMIN: 2.6 g/dL — AB (ref 3.5–5.2)
ALK PHOS: 43 U/L (ref 39–117)
ALT: 20 U/L (ref 0–53)
AST: 18 U/L (ref 0–37)
Anion gap: 10 (ref 5–15)
BILIRUBIN TOTAL: 0.3 mg/dL (ref 0.3–1.2)
BUN: 31 mg/dL — ABNORMAL HIGH (ref 6–23)
CHLORIDE: 102 meq/L (ref 96–112)
CO2: 28 mEq/L (ref 19–32)
CREATININE: 0.92 mg/dL (ref 0.50–1.35)
Calcium: 8.3 mg/dL — ABNORMAL LOW (ref 8.4–10.5)
GFR calc non Af Amer: 73 mL/min — ABNORMAL LOW (ref >90)
GFR, EST AFRICAN AMERICAN: 85 mL/min — AB (ref >90)
Glucose, Bld: 110 mg/dL — ABNORMAL HIGH (ref 70–99)
POTASSIUM: 4.3 meq/L (ref 3.7–5.3)
Sodium: 140 mEq/L (ref 137–147)
TOTAL PROTEIN: 6.2 g/dL (ref 6.0–8.3)

## 2014-03-27 LAB — CULTURE, BLOOD (ROUTINE X 2)
Culture: NO GROWTH
Culture: NO GROWTH

## 2014-03-27 LAB — CBC
HEMATOCRIT: 42 % (ref 39.0–52.0)
Hemoglobin: 13.4 g/dL (ref 13.0–17.0)
MCH: 24.9 pg — ABNORMAL LOW (ref 26.0–34.0)
MCHC: 31.9 g/dL (ref 30.0–36.0)
MCV: 78.1 fL (ref 78.0–100.0)
Platelets: 204 10*3/uL (ref 150–400)
RBC: 5.38 MIL/uL (ref 4.22–5.81)
RDW: 16.8 % — AB (ref 11.5–15.5)
WBC: 9.9 10*3/uL (ref 4.0–10.5)

## 2014-03-27 LAB — GLUCOSE, CAPILLARY: GLUCOSE-CAPILLARY: 130 mg/dL — AB (ref 70–99)

## 2014-03-27 MED ORDER — FUROSEMIDE 10 MG/ML IJ SOLN
40.0000 mg | Freq: Every day | INTRAMUSCULAR | Status: DC
  Administered 2014-03-27 – 2014-03-28 (×2): 40 mg via INTRAVENOUS
  Filled 2014-03-27 (×2): qty 4

## 2014-03-27 NOTE — Clinical Social Work Note (Signed)
[facility] is medically [facility] CSW will continue to support and assist with discharge.    Jim Contreras, MSW, LCSWA [phone]

## 2014-03-27 NOTE — Progress Notes (Signed)
[facility] PROGRESS NOTE  Jim Contreras ZOX:[id number] DOB: 02-Nov-1923 DOA: 03/21/2014 PCP: [name], MD  Assessment/Plan: Principal Problem:   Acute on chronic respiratory failure with hypoxemia Active Problems:   COPD with emphysema Gold C    ILD (interstitial lung disease)   Pulmonary hypertension   Hypertension   Collagenous colitis   Pneumonia   Accidental fall from bed   Acute encephalopathy   Patient with COPD Gold Stage C admitted with dyspnea and acute hypoxemic resp failure   Assessment and plan  Acute on chronic hypoxemic respiratory failure secondary to left lower lobe pneumonia  Healthcare associated pneumonia  COPD exacerbation  Interstitial lung disease with pulmonary hypertension  - has seen Dr Delford Field as outpt, pulmonary service following on a daily basis, currently on nasal cannula at 6 L Trying to wean oxygen ,  Discontinue Azithromycin, Azactam, and Vancomycin on 10/11  We switched him to Imipenem 250 mg IV q6h , patient allergic to penicillin therefore cannot get cephalosporin  - cont wean steroids, nebs and O2 , appreciate pulmonary input  On oxygen 2 L at home, currently on venti mask, will transfer to telemetry if able to titrate his oxygen levels down  Continue chest percussion therapy, out of bed to chair  Did not need BiPAP during this hospitalization since admission  Speech therapy evaluation recommends regular diet and thin liquids  Repeat chest x-ray today remains unchanged, slight worsening?  We'll restart Lasix, 40 mg x1 VQ scan intermediate probability, therefore due to CT angiography which was negative for PE  Venous Doppler of the bilateral lower extremities negative for DVT Transfer to telemetry if able to stay on 6 L and maintain oxygenation equal to or greater than 90%  Hypokalemia replete  Chronic stable Diastolic heart failure  Discontinued IV hydration  Restart Lasix 2-D echo , showed diastolic heart failure EF of  81-19%, discussed with the patient's daughter    Hypertension  Increase Norvasc,, increased frequency of hydralazine  Noted bradycardia of unclear etiology Currently on no beta blockers  BPH  Continue Avodart and Flomax   GERD  continue Protonix   Acute encephalopathy  Fall from bed prior to admission-follow mental status  - PT eval recommended SNF     Follow precautions  Code Status: full  Family Communication: family updated about patient's clinical progress  Disposition Plan: Attempting to transfer out to step down and oxygenation is better , eventually will need SNF    Brief narrative:  Jim Contreras is a 80 y.o. male with Past medical history of hypertension, COPD with chronic resp failure on chronic o2 3LPM, CVA, interstitial lung disease, BPH, diastolic dysfunction, pulmonary hypertension.  The patient is presenting with complaints of shortness of breath ongoing with last 1 week progressively worsening along with cough with expectoration. He also had fever and chills. He denies any chest pain. He denies any choking episode. He is from an assisted living facility. No recent travel reported. No leg swelling or leg tenderness. No palpitation dizziness lightheadedness nausea vomiting abdominal pain diarrhea constipation burning urination.  Medication has been notified based on the documentation available from the nursing home and no recent change identified.  Initially the history was reported that the patient had a fall twice on my discussion with the nursing home staff today while the patient was coming out of the bed when his wheelchair was not locked, He fell down on the ground, no head injury or neck injury reported.  Also since  last 2 days the patient has not been acting himself as per the nursing home staff.  The patient is coming from SNF. And at his baseline independent for most of his ADL.  Consultants:  None Procedures:  None Antibiotics:  Vancomycin  aztreonam/azithromycin discontinued  None switch to imipenem on 10/11 Objective: Filed Vitals:   03/27/14 0718 03/27/14 0942 03/27/14 1026 03/27/14 1100  BP: 121/50  123/63   Pulse: 49     Temp: 97.8 F (36.6 C)   97.8 F (36.6 C)  TempSrc: Axillary   Axillary  Resp: 11     Height:      Weight:      SpO2: 94% 88%      Intake/Output Summary (Last 24 hours) at 03/27/14 1134 Last data filed at 03/27/14 1100  Gross per 24 hour  Intake    101 ml  Output    101 ml  Net      0 ml    Exam:  General: alert & oriented x 3 In NAD  Cardiovascular: RRR, nl S1 s2  Respiratory: Wheezing present, scattered rhonchi, no crackles  Abdomen: soft +BS NT/ND, no masses palpable  Extremities: No cyanosis and no edema      Data Reviewed: Basic Metabolic Panel:  Recent Labs Lab 03/21/14 1725 03/22/14 0253 03/25/14 0335 03/26/14 0232 03/27/14 0332  NA 137 140 142 142 140  K 4.0 3.8 3.6* 3.5* 4.3  CL 103 108 104 100 102  CO2 22 20 25 30 28   GLUCOSE 119* 182* 137* 103* 110*  BUN 19 17 31* 31* 31*  CREATININE 1.15 1.13 1.12 0.97 0.92  CALCIUM 9.2 7.8* 8.3* 8.5 8.3*  MG  --   --   --  2.3  --   PHOS  --   --   --  2.7  --     Liver Function Tests:  Recent Labs Lab 03/21/14 1725 03/22/14 0253 03/25/14 0335 03/27/14 0332  AST 17 15 22 18   ALT 9 8 16 20   ALKPHOS 61 51 41 43  BILITOT 0.7 0.5 0.3 0.3  PROT 7.4 6.2 6.1 6.2  ALBUMIN 3.4* 2.7* 2.6* 2.6*   No results found for this basename: LIPASE, AMYLASE,  in the last 168 hours No results found for this basename: AMMONIA,  in the last 168 hours  CBC:  Recent Labs Lab 03/21/14 1725 03/22/14 0253 03/25/14 0335 03/27/14 0332  WBC 15.6* 12.2* 7.8 9.9  NEUTROABS 13.2* 11.6*  --   --   HGB 13.2 12.5* 12.0* 13.4  HCT 40.9 39.4 38.0* 42.0  MCV 77.6* 79.0 79.7 78.1  PLT 195 171 194 204    Cardiac Enzymes: No results found for this basename: CKTOTAL, CKMB, CKMBINDEX, TROPONINI,  in the last 168 hours BNP (last 3  results)  Recent Labs  03/24/14 2158  PROBNP 1012.0*     CBG: No results found for this basename: GLUCAP,  in the last 168 hours  Recent Results (from the past 240 hour(s))  CULTURE, BLOOD (ROUTINE X 2)     Status: None   Collection Time    03/21/14  5:25 PM      Result Value Ref Range Status   Specimen Description BLOOD RIGHT FOREARM   Final   Special Requests BOTTLES DRAWN AEROBIC AND ANAEROBIC 5CC   Final   Culture  Setup Time     Final   Value: 03/21/2014 23:02     Performed at [facility]  Final   Value: NO GROWTH 5 DAYS     Performed at [facility]   Report Status 03/27/2014 FINAL   Final  CULTURE, BLOOD (ROUTINE X 2)     Status: None   Collection Time    03/21/14  5:40 PM      Result Value Ref Range Status   Specimen Description BLOOD LEFT FOREARM   Final   Special Requests BOTTLES DRAWN AEROBIC ONLY 1CC   Final   Culture  Setup Time     Final   Value: 03/21/2014 23:04     Performed at [facility]   Culture     Final   Value: NO GROWTH 5 DAYS     Performed at [facility]   Report Status 03/27/2014 FINAL   Final  MRSA PCR SCREENING     Status: None   Collection Time    03/21/14 10:49 PM      Result Value Ref Range Status   MRSA by PCR NEGATIVE  NEGATIVE Final   Comment:            The GeneXpert MRSA Assay (FDA     approved for NASAL specimens     only), is one component of a     comprehensive MRSA colonization     surveillance program. It is not     intended to diagnose MRSA     infection nor to guide or     monitor treatment for     MRSA infections.  URINE CULTURE     Status: None   Collection Time    03/23/14  9:00 PM      Result Value Ref Range Status   Specimen Description URINE, CLEAN CATCH   Final   Special Requests NONE   Final   Culture  Setup Time     Final   Value: 03/23/2014 21:40     Performed at [facility]   Colony Count     Final   Value: NO GROWTH     Performed at [facility]   Culture     Final   Value: NO GROWTH     Performed at [facility]   Report Status 03/25/2014 FINAL   Final     Studies: Ct Head Wo Contrast  03/21/2014   CLINICAL DATA:  Altered mental status after fall.  EXAM: CT HEAD WITHOUT CONTRAST  TECHNIQUE: Contiguous axial images were obtained from the base of the skull through the vertex without intravenous contrast.  COMPARISON:  CT scan of December 31, 2012.  FINDINGS: Bony calvarium appears intact. Diffuse cortical atrophy is noted. Mild chronic ischemic white matter disease is noted. No mass effect or midline shift is noted. Ventricular size is within normal limits. There is no evidence of mass lesion, hemorrhage or acute infarction.  IMPRESSION: Diffuse cortical atrophy. Mild chronic ischemic white matter disease. No acute intracranial abnormality seen.   Electronically Signed   By: Roque LiasJames  Green M.D.   On: 03/21/2014 18:26   Ct Angio Chest Pe W/cm &/or Wo Cm  03/26/2014   CLINICAL DATA:  11ighty-eight -year-old male with acute shortness of breath. Intermediate V/Q scan. Initial encounter.  EXAM: CT ANGIOGRAPHY CHEST WITH CONTRAST  TECHNIQUE: Multidetector CT imaging of the chest was performed using the standard protocol during bolus administration of intravenous contrast. Multiplanar CT image reconstructions and MIPs were obtained to evaluate the vascular anatomy.  CONTRAST:  62mL OMNIPAQUE IOHEXOL 350 MG/ML SOLN  COMPARISON:  High-resolution Chest CT 05/01/2013. Portable chest x-ray 03/24/2014. V/Q scan 03/25/2014.  FINDINGS: Good contrast bolus timing in the pulmonary arterial tree. Respiratory motion artifact at both lung bases. No focal filling defect identified in the pulmonary arterial tree to suggest the presence of acute pulmonary embolism.  Chronic emphysema. Mildly lower lung volumes compared to 2014. Central airways are patent except for a atelectatic changes. Small bilateral layering pleural effusions. Combination of  compressive lower lobe atelectasis and also some patchy right lower lobe pulmonary opacity. In the peripheral right lower lobe there is hyperdense opacity (series 4, image 73 which might be previously aspirated oral contrast, uncertain. However, this was also present to a degree in 2014 and at that time alveolar or microlithiasis was considered. Chronic peripheral increased reticular opacity in the mid lungs is stable.  No pericardial effusion. Calcified coronary artery and aortic atherosclerosis. Stable small right peritracheal and mediastinal lymph nodes. No axillary lymphadenopathy. Negative thoracic inlet.  Negative visualized liver, spleen, pancreas, adrenal glands, kidneys. New moderate-sized gastric hiatal hernia.  Chronic left costovertebral angle rib fractures (e.g. Left seventh rib series 6, image 38). Osteopenia. Mild T12 compression fracture appears nonacute but increased since 2014. No acute osseous abnormality identified.  Review of the MIP images confirms the above findings.  IMPRESSION: 1.  No evidence of acute pulmonary embolus. 2. Chronic emphysema and interstitial lung disease with superimposed small pleural effusions and acute right lower lobe opacity suspicious for pneumonia. 3. Superimposed chronic and progressed right lower lobe hyperdense material with a diagnosis of alveolar microlithiasis considered on the prior study of 2014. 4. Chronic but progressed T12 compression fracture.   Electronically Signed   By: Augusto GambleLee  Hall M.D.   On: 03/26/2014 13:28   Nm Pulmonary Perf And Vent  03/25/2014   CLINICAL DATA:  Acute on chronic respiratory failure. Hypoxemia. COPD. Emphysema. Encephalopathy.  EXAM: NUCLEAR MEDICINE VENTILATION - PERFUSION LUNG SCAN  TECHNIQUE: Ventilation images were obtained in multiple projections using inhaled aerosol technetium 99 M DTPA. Perfusion images were obtained in multiple projections after intravenous injection of Tc-4069m MAA.  RADIOPHARMACEUTICALS:  6.0 mCi Tc-6969m  DTPA aerosol and 40 mCi Tc-8769m MAA  COMPARISON:  03/24/2014  FINDINGS: Ventilation: Abnormal accumulation of radiotracer medially near the hilum in both lungs. Hypoventilation at the right lung base and in the lingula. Hypoventilation in the lung apices.  Perfusion: The patient was apparently unable to raise the right arm, leading to a photopenic defect on the lateral projection. Patchy moderate-sized perfusion defect posteriorly in the right lung corresponding to radiographic and ventilation abnormalities. Patchy hypoperfusion in both upper lobes.  IMPRESSION: 1. Intermediate probability of pulmonary embolus (risk 20-79%) due to triple matched large segmental defects in the lower lung zone and multiple moderate to large matched the defects in the upper lungs. There is known airspace opacity in the lung bases as well as severe emphysema especially affecting the upper lobes.   Electronically Signed   By: Herbie BaltimoreWalt  Liebkemann M.D.   On: 03/25/2014 13:22   Dg Chest Port 1 View  03/24/2014   CLINICAL DATA:  Shortness of Breath.  COPD.  Smoker.  A a seen  EXAM: PORTABLE CHEST - 1 VIEW  COMPARISON:  03/21/2014  FINDINGS: Continued left lower lobe airspace disease is similar prior study. In addition, there is increasing right basilar airspace opacity. Findings concerning for multifocal pneumonia.  Underlying COPD/hyperinflation. Heart is borderline in size. No effusions or acute bony abnormality.  IMPRESSION: Stable left lower lobe airspace disease  with worsening right basilar airspace opacity. Findings concerning for worsening multifocal pneumonia.   Electronically Signed   By: Charlett Nose M.D.   On: 03/24/2014 15:17   Dg Chest Port 1 View  03/21/2014   CLINICAL DATA:  Confusion fever.  Hypertension.  COPD.  Cough.  EXAM: PORTABLE CHEST - 1 VIEW  COMPARISON:  12/31/2012.  03/09/2012  FINDINGS: Midline trachea. Mild cardiomegaly with atherosclerosis in the transverse aorta. No right and no definite left pleural  effusion. Lower lobe predominant interstitial thickening is moderate. Right upper lobe scarring. More confluent left lower lobe patchy airspace disease.  IMPRESSION: Left lower lobe airspace disease, suspicious for infection or aspiration. Progression of interstitial lung disease is felt less likely.  Interstitial lung disease as was detailed on 05/01/2013 high-resolution CT. Considerations include alveolar microlithiasis superimposed upon nonspecific interstitial pneumonitis.  Cardiomegaly without congestive failure.   Electronically Signed   By: Jeronimo Greaves M.D.   On: 03/21/2014 17:58    Scheduled Meds: . amLODipine  10 mg Oral Daily  . antiseptic oral rinse  7 mL Mouth Rinse BID  . aspirin EC  81 mg Oral Daily  . dutasteride  0.5 mg Oral Daily  . enoxaparin (LOVENOX) injection  40 mg Subcutaneous Q24H  . guaiFENesin  600 mg Oral BID  . imipenem-cilastatin  250 mg Intravenous 4 times per day  . ipratropium-albuterol  3 mL Nebulization TID  . losartan  50 mg Oral Daily  . pantoprazole  40 mg Oral Daily  . potassium chloride  20 mEq Oral BID  . pravastatin  40 mg Oral Daily  . tamsulosin  0.4 mg Oral Daily   Continuous Infusions:   Principal Problem:   Acute on chronic respiratory failure with hypoxemia Active Problems:   COPD with emphysema Gold C    ILD (interstitial lung disease)   Pulmonary hypertension   Hypertension   Collagenous colitis   Pneumonia   Accidental fall from bed   Acute encephalopathy    Time spent: 40 minutes   [facility]  [facility] Pager [phone]. If 7PM-7AM, please contact night-coverage at [email or web address], password [facility] 03/27/2014, 11:34 AM  LOS: 6 days

## 2014-03-27 NOTE — Progress Notes (Signed)
Tried on 28% venti-mask, desat down to 83%, back on 100% nrm, no complaints.  Continue to watch.

## 2014-03-27 NOTE — Procedures (Signed)
Objective Swallowing Evaluation: Modified Barium Swallowing Study  Patient Details  Name: Jim Contreras MRN: [id number] Date of Birth: [birth year]  Today's Date: 03/27/2014 Time: [id number] SLP Time Calculation (min): 25 min  Past Medical History:  Past Medical History  Diagnosis Date  . Hypertension   . Hypercholesteremia   . BPH (benign prostatic hyperplasia)   . ILD (interstitial lung disease)   . COPD, moderate   . Stroke     Thalamic   Past Surgical History:  Past Surgical History  Procedure Laterality Date  . Cataract extraction Bilateral   . Appendectomy    . Hernia repair    . Flexible sigmoidoscopy N/A 04/24/2013    Procedure: FLEXIBLE SIGMOIDOSCOPY;  Surgeon: Shirley FriarVincent C. Schooler, MD;  [facility];  Service: Endoscopy;  Laterality: N/A;   HPI:  80 yo male admitted for SOB, productive cough, and confusion with LLL airspace disease per CXR. PMH includes COPD, ILD, GERD, hypertension. Pt had MBS in 2013 with deep, silent penetration to the vocal cords with large sips of thin liquid. SLP recommended a regular diet and thin liquids with small, single sips to increase airway protection.  Pt has been followed for several days with increasing concerns for aspiration.  Has required a partial nonrebreather.  Recent CXR showed worsened biltaeral basilar predominant opacities.  Has been coughing with meals, due in part to rushing rather than eating more slowly and allowing time for respiration.  MBS recommended.       Assessment / Plan / Recommendation Clinical Impression  Dysphagia Diagnosis: Mild pharyngeal phase dysphagia  Clinical impression: Pt's swallow abilities appeared to be quite functional during MBS.  Demonstrated only occasional penetration of thin liquids, but it was not observed to reach the level of the vocal cords.  There was mild pharyngeal residue post-swallow.  No aspiration. During assessment, pt's pace of consumption was controlled by examiner.  When  pt is functionally eating/drinking,  he has been observed to do so at rapid rates, leading to clinical signs of aspiration.    Recommend regular diet, thin liquids but strict precautions to minimize potential for aspiration:  SLOW RATE; REPLACE NRB BETWEEN BOLUSES OF FOOD/LIQUID AND ALLOW 5-6 BREATHS BEFORE RESUMING EATING.    Treatment Recommendation    Continue per original plan   Diet Recommendation Regular;Thin liquid   Liquid Administration via: Cup;Straw Medication Administration: Whole meds with puree Supervision: Patient able to self feed;Intermittent supervision to cue for compensatory strategies Compensations: Slow rate;Small sips/bites (breathe 5-6 times between swallows) Postural Changes and/or Swallow Maneuvers: Seated upright 90 degrees    Other  Recommendations Oral Care Recommendations: Oral care BID   Follow Up Recommendations   (tba)                SLP Swallow Goals     General Date of Onset: 03/27/14 HPI: 80 yo male admitted for SOB, productive cough, and confusion with LLL airspace disease per CXR. PMH includes COPD, ILD, GERD, hypertension. Pt had MBS in 2013 with deep, silent penetration to the vocal cords with large sips of thin liquid. SLP recommended a regular diet and thin liquids with small, single sips to increase airway protection.  Pt has been followed for several days with increasing concerns for aspiration.  Has required a partial nonrebreather.  Recent CXR showed worsened biltaeral basilar predominant opacities.  Has been coughing with meals, due in part to rushing rather than eating more slowly and allowing time for respiration.  MBS recommended.   Type  of Study: Modified Barium Swallowing Study Reason for Referral: Objectively evaluate swallowing function Previous Swallow Assessment: see HPI Diet Prior to this Study: Dysphagia 3 (soft);Nectar-thick liquids Temperature Spikes Noted: No Respiratory Status: non-rebreather History of Recent  Intubation: No Behavior/Cognition: Alert;Cooperative Oral Cavity - Dentition: Adequate natural dentition Oral Motor / Sensory Function: Within functional limits Self-Feeding Abilities: Able to feed self Patient Positioning: Upright in chair Baseline Vocal Quality: Clear Volitional Cough: Strong Volitional Swallow: Able to elicit Anatomy: Within functional limits Pharyngeal Secretions: Not observed secondary MBS    Reason for Referral Objectively evaluate swallowing function   Oral Phase Oral Preparation/Oral Phase Oral Phase: WFL   Pharyngeal Phase Pharyngeal Phase Pharyngeal Phase: Impaired Pharyngeal - Thin Pharyngeal - Thin Cup: Delayed swallow initiation;Penetration/Aspiration during swallow;Pharyngeal residue - valleculae Penetration/Aspiration details (thin cup): Material enters airway, remains ABOVE vocal cords and not ejected out Pharyngeal - Solids Pharyngeal - Puree: Premature spillage to valleculae;Pharyngeal residue - valleculae Pharyngeal - Regular: Delayed swallow initiation;Pharyngeal residue - valleculae  Cervical Esophageal Phase    Jim Contreras L. Jim Contreras, [place] CCC/SLP Pager [phone]              Jim Contreras, Jim Contreras 03/27/2014, 2:40 PM

## 2014-03-27 NOTE — Progress Notes (Signed)
ANTIBIOTIC CONSULT NOTE - Follow Up  Pharmacy Consult for Imipenem Indication: HCAP  Allergies  Allergen Reactions  . Floxin [Ofloxacin] Other (See Comments)    Lost feeling in feet  . Penicillins Other (See Comments)    Ulcers in mouth  . Sulfa Antibiotics Other (See Comments)    Ulcers in mouth    Patient Measurements: Height: 5\' 7"  (170.2 cm) Weight: 152 lb 5.4 oz (69.1 kg) IBW/kg (Calculated) : 66.1  Vital Signs: Temp: 97.8 F (36.6 C) (10/14 1100) Temp Source: Axillary (10/14 1100) BP: 123/63 mmHg (10/14 1026) Pulse Rate: 49 (10/14 0718)  Labs:  Recent Labs  03/25/14 0335 03/26/14 0232 03/27/14 0332  WBC 7.8  --  9.9  HGB 12.0*  --  13.4  PLT 194  --  204  CREATININE 1.12 0.97 0.92   Estimated Creatinine Clearance: 51.9 ml/min (by C-G formula based on Cr of 0.92). No results found for this basename: VANCOTROUGH, Leodis BinetVANCOPEAK, VANCORANDOM, GENTTROUGH, GENTPEAK, GENTRANDOM, TOBRATROUGH, TOBRAPEAK, TOBRARND, AMIKACINPEAK, AMIKACINTROU, AMIKACIN,  in the last 72 hours   Microbiology: Recent Results (from the past 720 hour(s))  CULTURE, BLOOD (ROUTINE X 2)     Status: None   Collection Time    03/21/14  5:25 PM      Result Value Ref Range Status   Specimen Description BLOOD RIGHT FOREARM   Final   Special Requests BOTTLES DRAWN AEROBIC AND ANAEROBIC 5CC   Final   Culture  Setup Time     Final   Value: 03/21/2014 23:02     [facility]   Culture     Final   Value: NO GROWTH 5 DAYS     [facility] at [facility]   Report Status 03/27/2014 FINAL   Final  CULTURE, BLOOD (ROUTINE X 2)     Status: None   Collection Time    03/21/14  5:40 PM      Result Value Ref Range Status   Specimen Description BLOOD LEFT FOREARM   Final   Special Requests BOTTLES DRAWN AEROBIC ONLY 1CC   Final   Culture  Setup Time     Final   Value: 03/21/2014 23:04     [facility]formed at [facility]   Culture     Final   Value: NO GROWTH 5 DAYS      Performed at [facility]   Report Status 03/27/2014 FINAL   Final  MRSA PCR SCREENING     Status: None   Collection Time    03/21/14 10:49 PM      Result Value Ref Range Status   MRSA by PCR NEGATIVE  NEGATIVE Final   Comment:            The GeneXpert MRSA Assay (FDA     approved for NASAL specimens     only), is one component of a     comprehensive MRSA colonization     surveillance program. It is not     intended to diagnose MRSA     infection nor to guide or     monitor treatment for     MRSA infections.  URINE CULTURE     Status: None   Collection Time    03/23/14  9:00 PM      Result Value Ref Range Status   Specimen Description URINE, CLEAN CATCH   Final   Special Requests NONE   Final   Culture  Setup Time     Final  Value: 03/23/2014 21:40     [facility]   Colony Count     Final   Value: NO GROWTH     Performed at [facility]   Culture     Final   Value: NO GROWTH     Performed at [facility]   Report Status 03/25/2014 FINAL   Final    Medical History: Past Medical History  Diagnosis Date  . Hypertension   . Hypercholesteremia   . BPH (benign prostatic hyperplasia)   . ILD (interstitial lung disease)   . COPD, moderate   . Stroke     Thalamic   Assessment: 80 y/o M presented from [facility] with confusion, fever, and two falls. Pharmacy managing Imipenem for HCAP. WBC has trended down to nl. Pt remains afebrile. CrCl ~ 69 mL/min. D#4 of Imipenem / D#7 of antibiotics.   10/9 Azithromycin >> 10/11 10/9 Azactam >> 10/11 10/9 Vanc >> 10/11 10/11 Imipenem 250 mg IV q6h >>   10/8 blood x2- neg 10/10 Legionella >> neg 10/10 Strep pneumo >> neg 10/10 UCx >> neg    Plan:  - Continue Imipenem 250 mg IV Q 6 hours  - Monitor CBC, renal fx, cultures and patient's clinical progress - Consider switching to an alternative antibiotic given critical shortage of Imipenem.   Vinnie LevelBenjamin Jennie Bolar, PharmD.   Clinical Pharmacist Pager [phone]

## 2014-03-28 DIAGNOSIS — I270 Primary pulmonary hypertension: Secondary | ICD-10-CM

## 2014-03-28 MED ORDER — FUROSEMIDE 40 MG PO TABS
60.0000 mg | ORAL_TABLET | Freq: Every day | ORAL | Status: DC
  Administered 2014-03-28: 60 mg via ORAL
  Filled 2014-03-28 (×3): qty 1

## 2014-03-28 MED ORDER — DEXTROSE 5 % IV SOLN
1.0000 g | INTRAVENOUS | Status: DC
  Administered 2014-03-28: 1 g via INTRAVENOUS
  Filled 2014-03-28 (×2): qty 10

## 2014-03-28 MED ORDER — DEXTROSE 5 % IV SOLN
500.0000 mg | INTRAVENOUS | Status: DC
  Administered 2014-03-28: 500 mg via INTRAVENOUS
  Filled 2014-03-28 (×2): qty 500

## 2014-03-28 MED ORDER — FUROSEMIDE 40 MG PO TABS
40.0000 mg | ORAL_TABLET | Freq: Every day | ORAL | Status: DC
  Filled 2014-03-28: qty 1

## 2014-03-28 NOTE — Progress Notes (Signed)
Utilization review completed.  

## 2014-03-28 NOTE — Progress Notes (Addendum)
Patient oxygen level dropped into seventies while on 55% Venti Mask, removed mask and applied a non-rebreather.  Pt. Saturations levels increased into the low nineties.   Will continue to monitor.

## 2014-03-28 NOTE — Progress Notes (Addendum)
[facility] PROGRESS NOTE  [name] YNW:[id number] DOB: 02-13-1924 DOA: 03/21/2014 PCP: [name], MD  Assessment/Plan: Principal Problem:   Acute on chronic respiratory failure with hypoxemia Active Problems:   COPD with emphysema Gold C    ILD (interstitial lung disease)   Pulmonary hypertension   Hypertension   Collagenous colitis   Pneumonia   Accidental fall from bed   Acute encephalopathy   Patient with COPD Gold Stage C admitted with dyspnea and acute hypoxemic resp failure    Assessment and plan  Acute on chronic hypoxemic respiratory failure secondary to left lower lobe pneumonia  Healthcare associated pneumonia  COPD exacerbation  Interstitial lung disease with pulmonary hypertension  - has seen Dr Delford Field as outpt, pulmonary service following on a daily basis, currently on nasal cannula at 6 L  Trying to wean oxygen ,  Discontinued Azithromycin, Azactam, and Vancomycin on 10/11  We switched him to Imipenem 250 mg IV q6h , on 10/15 patient was started on Rocephin and azithromycin - cont wean steroids, nebs and O2 , appreciate pulmonary input  On oxygen 2 L at home, currently on nonrebreather Continue chest percussion therapy, out of bed to chair , start patient on incentive spirometry Speech therapy evaluation recommends regular diet and thin liquids  Switch to by mouth Lasix at 40 mg a day VQ scan intermediate probability, therefore did CT angiography which was negative for PE  Venous Doppler of the bilateral lower extremities negative for DVT  Transfer to telemetry if able to stay on 6 L and maintain oxygenation equal to or greater than 90%    Hypokalemia replete   Chronic stable Diastolic heart failure  Discontinued IV hydration  Restart Lasix  2-D echo , showed diastolic heart failure EF of 65-70%, discussed with the patient's daughter  Start by mouth Lasix, titrate to blood pressure, electrolytes, renal function  Hypertension  Continue  Norvasc, Lasix   BPH  Continue Avodart and Flomax   GERD  continue Protonix   Acute encephalopathy  Fall from bed prior to admission-follow mental status  - PT eval recommended SNF   Fall precautions    Code Status: full  Family Communication: family updated about patient's clinical progress  Disposition Plan: Attempting to transfer out to step down and oxygenation is better , eventually will need SNF   Brief narrative:  Jim Contreras is a 80 y.o. male with Past medical history of hypertension, COPD with chronic resp failure on chronic o2 3LPM, CVA, interstitial lung disease, BPH, diastolic dysfunction, pulmonary hypertension.  The patient is presenting with complaints of shortness of breath ongoing with last 1 week progressively worsening along with cough with expectoration. He also had fever and chills. He denies any chest pain. He denies any choking episode. He is from an assisted living facility. No recent travel reported. No leg swelling or leg tenderness. No palpitation dizziness lightheadedness nausea vomiting abdominal pain diarrhea constipation burning urination.  Medication has been notified based on the documentation available from the nursing home and no recent change identified.  Initially the history was reported that the patient had a fall twice on my discussion with the nursing home staff today while the patient was coming out of the bed when his wheelchair was not locked, He fell down on the ground, no head injury or neck injury reported.  Also since last 2 days the patient has not been acting himself as per the nursing home staff.  The patient is coming from  SNF. And at his baseline independent for most of his ADL.  Consultants:  None Procedures:  None Antibiotics:  Vancomycin aztreonam/azithromycin discontinued 10/11  imipenem on 10/11-10/15 Rocephin, azithromycin started 10/15  HPI/Subjective: Subjective  improving, currently on 100%  FiO2  Objective: Filed Vitals:   03/27/14 2312 03/28/14 0500 03/28/14 0531 03/28/14 0723  BP: 119/56  141/57   Pulse: 62  58   Temp: 97.8 F (36.6 C)  97.6 F (36.4 C) 98 F (36.7 C)  TempSrc: Axillary  Axillary Axillary  Resp: 20  16   Height:      Weight:  69 kg (152 lb 1.9 oz)    SpO2: 92%  95% 92%    Intake/Output Summary (Last 24 hours) at 03/28/14 1209 Last data filed at 03/28/14 0530  Gross per 24 hour  Intake    240 ml  Output    150 ml  Net     90 ml    Exam:  General: alert & oriented x 3 In NAD  Cardiovascular: RRR, nl S1 s2  Respiratory: Decreased breath sounds at the bases, scattered rhonchi, no crackles  Abdomen: soft +BS NT/ND, no masses palpable  Extremities: No cyanosis and no edema      Data Reviewed: Basic Metabolic Panel:  Recent Labs Lab 03/21/14 1725 03/22/14 0253 03/25/14 0335 03/26/14 0232 03/27/14 0332  NA 137 140 142 142 140  K 4.0 3.8 3.6* 3.5* 4.3  CL 103 108 104 100 102  CO2 22 20 25 30 28   GLUCOSE 119* 182* 137* 103* 110*  BUN 19 17 31* 31* 31*  CREATININE 1.15 1.13 1.12 0.97 0.92  CALCIUM 9.2 7.8* 8.3* 8.5 8.3*  MG  --   --   --  2.3  --   PHOS  --   --   --  2.7  --     Liver Function Tests:  Recent Labs Lab 03/21/14 1725 03/22/14 0253 03/25/14 0335 03/27/14 0332  AST 17 15 22 18   ALT 9 8 16 20   ALKPHOS 61 51 41 43  BILITOT 0.7 0.5 0.3 0.3  PROT 7.4 6.2 6.1 6.2  ALBUMIN 3.4* 2.7* 2.6* 2.6*   No results found for this basename: LIPASE, AMYLASE,  in the last 168 hours No results found for this basename: AMMONIA,  in the last 168 hours  CBC:  Recent Labs Lab 03/21/14 1725 03/22/14 0253 03/25/14 0335 03/27/14 0332  WBC 15.6* 12.2* 7.8 9.9  NEUTROABS 13.2* 11.6*  --   --   HGB 13.2 12.5* 12.0* 13.4  HCT 40.9 39.4 38.0* 42.0  MCV 77.6* 79.0 79.7 78.1  PLT 195 171 194 204    Cardiac Enzymes: No results found for this basename: CKTOTAL, CKMB, CKMBINDEX, TROPONINI,  in the last 168 hours BNP (last 3  results)  Recent Labs  03/24/14 2158  PROBNP 1012.0*     CBG:  Recent Labs Lab 03/27/14 1118  GLUCAP 130*    Recent Results (from the past 240 hour(s))  CULTURE, BLOOD (ROUTINE X 2)     Status: None   Collection Time    03/21/14  5:25 PM      Result Value Ref Range Status   Specimen Description BLOOD RIGHT FOREARM   Final   Special Requests BOTTLES DRAWN AEROBIC AND ANAEROBIC 5CC   Final   Culture  Setup Time     Final   Value: 03/21/2014 23:02     Performed at [facility]  Final   Value: NO GROWTH 5 DAYS     Performed at [facility]   Report Status 03/27/2014 FINAL   Final  CULTURE, BLOOD (ROUTINE X 2)     Status: None   Collection Time    03/21/14  5:40 PM      Result Value Ref Range Status   Specimen Description BLOOD LEFT FOREARM   Final   Special Requests BOTTLES DRAWN AEROBIC ONLY 1CC   Final   Culture  Setup Time     Final   Value: 03/21/2014 23:04     Performed at [facility]   Culture     Final   Value: NO GROWTH 5 DAYS     Performed at [facility]   Report Status 03/27/2014 FINAL   Final  MRSA PCR SCREENING     Status: None   Collection Time    03/21/14 10:49 PM      Result Value Ref Range Status   MRSA by PCR NEGATIVE  NEGATIVE Final   Comment:            The GeneXpert MRSA Assay (FDA     approved for NASAL specimens     only), is one component of a     comprehensive MRSA colonization     surveillance program. It is not     intended to diagnose MRSA     infection nor to guide or     monitor treatment for     MRSA infections.  URINE CULTURE     Status: None   Collection Time    03/23/14  9:00 PM      Result Value Ref Range Status   Specimen Description URINE, CLEAN CATCH   Final   Special Requests NONE   Final   Culture  Setup Time     Final   Value: 03/23/2014 21:40     Performed at [facility]   Colony Count     Final   Value: NO GROWTH     Performed at [facility]    Culture     Final   Value: NO GROWTH     Performed at [facility]   Report Status 03/25/2014 FINAL   Final     Studies: Ct Head Wo Contrast  03/21/2014   CLINICAL DATA:  Altered mental status after fall.  EXAM: CT HEAD WITHOUT CONTRAST  TECHNIQUE: Contiguous axial images were obtained from the base of the skull through the vertex without intravenous contrast.  COMPARISON:  CT scan of December 31, 2012.  FINDINGS: Bony calvarium appears intact. Diffuse cortical atrophy is noted. Mild chronic ischemic white matter disease is noted. No mass effect or midline shift is noted. Ventricular size is within normal limits. There is no evidence of mass lesion, hemorrhage or acute infarction.  IMPRESSION: Diffuse cortical atrophy. Mild chronic ischemic white matter disease. No acute intracranial abnormality seen.   Electronically Signed   By: Roque LiasJames  Green M.D.   On: 03/21/2014 18:26   Ct Angio Chest Pe W/cm &/or Wo Cm  03/26/2014   CLINICAL DATA:  39ighty-eight -year-old male with acute shortness of breath. Intermediate V/Q scan. Initial encounter.  EXAM: CT ANGIOGRAPHY CHEST WITH CONTRAST  TECHNIQUE: Multidetector CT imaging of the chest was performed using the standard protocol during bolus administration of intravenous contrast. Multiplanar CT image reconstructions and MIPs were obtained to evaluate the vascular anatomy.  CONTRAST:  62mL OMNIPAQUE IOHEXOL 350 MG/ML SOLN  COMPARISON:  High-resolution Chest CT 05/01/2013. Portable chest x-ray 03/24/2014. V/Q scan 03/25/2014.  FINDINGS: Good contrast bolus timing in the pulmonary arterial tree. Respiratory motion artifact at both lung bases. No focal filling defect identified in the pulmonary arterial tree to suggest the presence of acute pulmonary embolism.  Chronic emphysema. Mildly lower lung volumes compared to 2014. Central airways are patent except for a atelectatic changes. Small bilateral layering pleural effusions. Combination of compressive lower lobe  atelectasis and also some patchy right lower lobe pulmonary opacity. In the peripheral right lower lobe there is hyperdense opacity (series 4, image 73 which might be previously aspirated oral contrast, uncertain. However, this was also present to a degree in 2014 and at that time alveolar or microlithiasis was considered. Chronic peripheral increased reticular opacity in the mid lungs is stable.  No pericardial effusion. Calcified coronary artery and aortic atherosclerosis. Stable small right peritracheal and mediastinal lymph nodes. No axillary lymphadenopathy. Negative thoracic inlet.  Negative visualized liver, spleen, pancreas, adrenal glands, kidneys. New moderate-sized gastric hiatal hernia.  Chronic left costovertebral angle rib fractures (e.g. Left seventh rib series 6, image 38). Osteopenia. Mild T12 compression fracture appears nonacute but increased since 2014. No acute osseous abnormality identified.  Review of the MIP images confirms the above findings.  IMPRESSION: 1.  No evidence of acute pulmonary embolus. 2. Chronic emphysema and interstitial lung disease with superimposed small pleural effusions and acute right lower lobe opacity suspicious for pneumonia. 3. Superimposed chronic and progressed right lower lobe hyperdense material with a diagnosis of alveolar microlithiasis considered on the prior study of 2014. 4. Chronic but progressed T12 compression fracture.   Electronically Signed   By: Augusto Gamble M.D.   On: 03/26/2014 13:28   Nm Pulmonary Perf And Vent  03/25/2014   CLINICAL DATA:  Acute on chronic respiratory failure. Hypoxemia. COPD. Emphysema. Encephalopathy.  EXAM: NUCLEAR MEDICINE VENTILATION - PERFUSION LUNG SCAN  TECHNIQUE: Ventilation images were obtained in multiple projections using inhaled aerosol technetium 99 M DTPA. Perfusion images were obtained in multiple projections after intravenous injection of Tc-52m MAA.  RADIOPHARMACEUTICALS:  6.0 mCi Tc-70m DTPA aerosol and 40 mCi  Tc-71m MAA  COMPARISON:  03/24/2014  FINDINGS: Ventilation: Abnormal accumulation of radiotracer medially near the hilum in both lungs. Hypoventilation at the right lung base and in the lingula. Hypoventilation in the lung apices.  Perfusion: The patient was apparently unable to raise the right arm, leading to a photopenic defect on the lateral projection. Patchy moderate-sized perfusion defect posteriorly in the right lung corresponding to radiographic and ventilation abnormalities. Patchy hypoperfusion in both upper lobes.  IMPRESSION: 1. Intermediate probability of pulmonary embolus (risk 20-79%) due to triple matched large segmental defects in the lower lung zone and multiple moderate to large matched the defects in the upper lungs. There is known airspace opacity in the lung bases as well as severe emphysema especially affecting the upper lobes.   Electronically Signed   By: Herbie Baltimore M.D.   On: 03/25/2014 13:22   Dg Chest Port 1 View  03/24/2014   CLINICAL DATA:  Shortness of Breath.  COPD.  Smoker.  A a seen  EXAM: PORTABLE CHEST - 1 VIEW  COMPARISON:  03/21/2014  FINDINGS: Continued left lower lobe airspace disease is similar prior study. In addition, there is increasing right basilar airspace opacity. Findings concerning for multifocal pneumonia.  Underlying COPD/hyperinflation. Heart is borderline in size. No effusions or acute bony abnormality.  IMPRESSION: Stable left lower lobe airspace disease  with worsening right basilar airspace opacity. Findings concerning for worsening multifocal pneumonia.   Electronically Signed   By: Charlett NoseKevin  Dover M.D.   On: 03/24/2014 15:17   Dg Chest Port 1 View  03/21/2014   CLINICAL DATA:  Confusion fever.  Hypertension.  COPD.  Cough.  EXAM: PORTABLE CHEST - 1 VIEW  COMPARISON:  12/31/2012.  03/09/2012  FINDINGS: Midline trachea. Mild cardiomegaly with atherosclerosis in the transverse aorta. No right and no definite left pleural effusion. Lower lobe  predominant interstitial thickening is moderate. Right upper lobe scarring. More confluent left lower lobe patchy airspace disease.  IMPRESSION: Left lower lobe airspace disease, suspicious for infection or aspiration. Progression of interstitial lung disease is felt less likely.  Interstitial lung disease as was detailed on 05/01/2013 high-resolution CT. Considerations include alveolar microlithiasis superimposed upon nonspecific interstitial pneumonitis.  Cardiomegaly without congestive failure.   Electronically Signed   By: Jeronimo GreavesKyle  Talbot M.D.   On: 03/21/2014 17:58   Dg Swallowing Func-speech Pathology  03/27/2014   Carolan ShiverAmanda Laurice Couture, CCC-SLP     03/27/2014  2:45 PM Objective Swallowing Evaluation: Modified Barium Swallowing Study   Patient Details  Name: Georgina QuintJames R Tetro MRN: [id number] Date of Birth: 1924/01/03  Today's Date: 03/27/2014 Time: [id number] SLP Time Calculation (min): 25 min  Past Medical History:  Past Medical History  Diagnosis Date  . Hypertension   . Hypercholesteremia   . BPH (benign prostatic hyperplasia)   . ILD (interstitial lung disease)   . COPD, moderate   . Stroke     Thalamic   Past Surgical History:  Past Surgical History  Procedure Laterality Date  . Cataract extraction Bilateral   . Appendectomy    . Hernia repair    . Flexible sigmoidoscopy N/A 04/24/2013    Procedure: FLEXIBLE SIGMOIDOSCOPY;  Surgeon: Shirley FriarVincent C.  Schooler, MD;  [facility];  Service: Endoscopy;   Laterality: N/A;   HPI:  80 yo male admitted for SOB, productive cough, and confusion with  LLL airspace disease per CXR. PMH includes COPD, ILD, GERD,  hypertension. Pt had MBS in 2013 with deep, silent penetration to  the vocal cords with large sips of thin liquid. SLP recommended a  regular diet and thin liquids with small, single sips to increase  airway protection.  Pt has been followed for several days with  increasing concerns for aspiration.  Has required a partial  nonrebreather.  Recent CXR showed  worsened biltaeral basilar  predominant opacities.  Has been coughing with meals, due in part  to rushing rather than eating more slowly and allowing time for  respiration.  MBS recommended.       Assessment / Plan / Recommendation Clinical Impression  Dysphagia Diagnosis: Mild pharyngeal phase dysphagia  Clinical impression: Pt's swallow abilities appeared to be quite  functional during MBS.  Demonstrated only occasional penetration  of thin liquids, but it was not observed to reach the level of  the vocal cords.  There was mild pharyngeal residue post-swallow.   No aspiration. During assessment, pt's pace of consumption was  controlled by examiner.  When pt is functionally eating/drinking,   he has been observed to do so at rapid rates, leading to  clinical signs of aspiration.    Recommend regular diet, thin liquids but strict precautions to  minimize potential for aspiration:  SLOW RATE; REPLACE NRB BETWEEN BOLUSES OF FOOD/LIQUID AND ALLOW  5-6 BREATHS BEFORE RESUMING EATING.    Treatment Recommendation    Continue per original  plan   Diet Recommendation Regular;Thin liquid   Liquid Administration via: Cup;Straw Medication Administration: Whole meds with puree Supervision: Patient able to self feed;Intermittent supervision  to cue for compensatory strategies Compensations: Slow rate;Small sips/bites (breathe 5-6 times  between swallows) Postural Changes and/or Swallow Maneuvers: Seated upright 90  degrees    Other  Recommendations Oral Care Recommendations: Oral care BID   Follow Up Recommendations   (tba)                SLP Swallow Goals     General Date of Onset: 03/27/14 HPI: 80 yo male admitted for SOB, productive cough, and confusion  with LLL airspace disease per CXR. PMH includes COPD, ILD, GERD,  hypertension. Pt had MBS in 2013 with deep, silent penetration to  the vocal cords with large sips of thin liquid. SLP recommended a  regular diet and thin liquids with small, single sips to increase  airway  protection.  Pt has been followed for several days with  increasing concerns for aspiration.  Has required a partial  nonrebreather.  Recent CXR showed worsened biltaeral basilar  predominant opacities.  Has been coughing with meals, due in part  to rushing rather than eating more slowly and allowing time for  respiration.  MBS recommended.   Type of Study: Modified Barium Swallowing Study Reason for Referral: Objectively evaluate swallowing function Previous Swallow Assessment: see HPI Diet Prior to this Study: Dysphagia 3 (soft);Nectar-thick liquids Temperature Spikes Noted: No Respiratory Status: non-rebreather History of Recent Intubation: No Behavior/Cognition: Alert;Cooperative Oral Cavity - Dentition: Adequate natural dentition Oral Motor / Sensory Function: Within functional limits Self-Feeding Abilities: Able to feed self Patient Positioning: Upright in chair Baseline Vocal Quality: Clear Volitional Cough: Strong Volitional Swallow: Able to elicit Anatomy: Within functional limits Pharyngeal Secretions: Not observed secondary MBS    Reason for Referral Objectively evaluate swallowing function   Oral Phase Oral Preparation/Oral Phase Oral Phase: WFL   Pharyngeal Phase Pharyngeal Phase Pharyngeal Phase: Impaired Pharyngeal - Thin Pharyngeal - Thin Cup: Delayed swallow  initiation;Penetration/Aspiration during swallow;Pharyngeal  residue - valleculae Penetration/Aspiration details (thin cup): Material enters  airway, remains ABOVE vocal cords and not ejected out Pharyngeal - Solids Pharyngeal - Puree: Premature spillage to valleculae;Pharyngeal  residue - valleculae Pharyngeal - Regular: Delayed swallow initiation;Pharyngeal  residue - valleculae  Cervical Esophageal Phase    Amanda L. Samson Frederic, [place] CCC/SLP Pager [phone]              Blenda Mounts Laurice 03/27/2014, 2:40 PM     Scheduled Meds: . amLODipine  10 mg Oral Daily  . antiseptic oral rinse  7 mL Mouth Rinse BID  . aspirin EC  81 mg Oral Daily   . cefTRIAXone (ROCEPHIN)  IV  1 g Intravenous Q24H  . dutasteride  0.5 mg Oral Daily  . enoxaparin (LOVENOX) injection  40 mg Subcutaneous Q24H  . furosemide  40 mg Intravenous Daily  . guaiFENesin  600 mg Oral BID  . ipratropium-albuterol  3 mL Nebulization TID  . losartan  50 mg Oral Daily  . pantoprazole  40 mg Oral Daily  . potassium chloride  20 mEq Oral BID  . pravastatin  40 mg Oral Daily  . tamsulosin  0.4 mg Oral Daily   Continuous Infusions:   Principal Problem:   Acute on chronic respiratory failure with hypoxemia Active Problems:   COPD with emphysema Gold C    ILD (interstitial lung disease)   Pulmonary hypertension   Hypertension  Collagenous colitis   Pneumonia   Accidental fall from bed   Acute encephalopathy    Time spent: 40 minutes   [facility]  [facility] Pager [phone]. If 7PM-7AM, please contact night-coverage at [email or web address], password [place] 03/28/2014, 12:09 PM  LOS: 7 days

## 2014-03-28 NOTE — Progress Notes (Signed)
Reviewed, continue with current POC.  Katira Dumais, PT DPT  [phone]  

## 2014-03-28 NOTE — Progress Notes (Signed)
Speech Language Pathology Treatment: Dysphagia  Patient Details Name: Jim Contreras MRN: [id number] DOB: 11/23/1923 Today's Date: 03/28/2014 Time: 2179-8102 SLP Time Calculation (min): 8 min  Assessment / Plan / Recommendation Clinical Impression  F/u after yesterday's MBS.  Pt with better toleration of POs today. Observed with latter part of lunch; no overt s/s aspiration. Discussed inherent risk of aspiration given his chronic respiratory condition and reviewed necessity of precautions to minimize risk: primarily allowing ample time to breathe between food/liquid boluses; not rushing to eat while NRB is temporarily removed.  Pt verbalizes understanding.  No further intervention is needed - pt generally following instructions.      HPI HPI: 80 yo male admitted for SOB, productive cough, and confusion with LLL airspace disease per CXR. PMH includes COPD, ILD, GERD, hypertension. Pt had MBS in 2013 with deep, silent penetration to the vocal cords with large sips of thin liquid. SLP recommended a regular diet and thin liquids with small, single sips to increase airway protection.  Pt has been followed for several days with increasing concerns for aspiration.  Has required a partial nonrebreather.  Recent CXR showed worsened biltaeral basilar predominant opacities.  Has been coughing with meals, due in part to rushing rather than eating more slowly and allowing time for respiration.     Pertinent Vitals Pain Assessment: 0-10 Pain Score:  (not rated) Pain Location: R side trunk Pain Descriptors / Indicators: Aching Pain Intervention(s): Limited activity within patient's tolerance;Monitored during session  SLP Plan  All goals met    Recommendations Diet recommendations: Regular;Thin liquid Liquids provided via: Cup;Straw Medication Administration: Whole meds with puree Supervision: Patient able to self feed;Intermittent supervision to cue for compensatory strategies Compensations: Slow  rate;Small sips/bites (allow 5-6 breaths between each bite/sip) Postural Changes and/or Swallow Maneuvers: Seated upright 90 degrees              Plan: All goals met   Jim Contreras L. Tivis Ringer, [place] CCC/SLP Pager [phone]      Juan Quam Laurice 03/28/2014, 2:33 PM

## 2014-03-28 NOTE — Progress Notes (Signed)
ANTIBIOTIC CONSULT NOTE - FOLLOW UP  Pharmacy Consult for Primaxin --> Ceftriaxone Indication: PNA  Allergies  Allergen Reactions  . Floxin [Ofloxacin] Other (See Comments)    Lost feeling in feet  . Penicillins Other (See Comments)    Ulcers in mouth  . Sulfa Antibiotics Other (See Comments)    Ulcers in mouth    Patient Measurements: Height: 5\' 7"  (170.2 cm) Weight: 152 lb 1.9 oz (69 kg) IBW/kg (Calculated) : 66.1 Adjusted Body Weight:   Vital Signs: Temp: 98 F (36.7 C) (10/15 0723) Temp Source: Axillary (10/15 0723) BP: 141/57 mmHg (10/15 0531) Pulse Rate: 58 (10/15 0531) Intake/Output from previous day: 10/14 0701 - 10/15 0700 In: 241 [P.O.:241] Out: 152 [Urine:151; Stool:1] Intake/Output from this shift:    Labs:  Recent Labs  03/26/14 0232 03/27/14 0332  WBC  --  9.9  HGB  --  13.4  PLT  --  204  CREATININE 0.97 0.92   Estimated Creatinine Clearance: 51.9 ml/min (by C-G formula based on Cr of 0.92). No results found for this basename: VANCOTROUGH, Leodis BinetVANCOPEAK, VANCORANDOM, GENTTROUGH, GENTPEAK, GENTRANDOM, TOBRATROUGH, TOBRAPEAK, TOBRARND, AMIKACINPEAK, AMIKACINTROU, AMIKACIN,  in the last 72 hours   Microbiology: Recent Results (from the past 720 hour(s))  CULTURE, BLOOD (ROUTINE X 2)     Status: None   Collection Time    03/21/14  5:25 PM      Result Value Ref Range Status   Specimen Description BLOOD RIGHT FOREARM   Final   Special Requests BOTTLES DRAWN AEROBIC AND ANAEROBIC 5CC   Final   Culture  Setup Time     Final   Value: 03/21/2014 23:02     Performed at [facility]   Culture     Final   Value: NO GROWTH 5 DAYS     Performed at [facility]   Report Status 03/27/2014 FINAL   Final  CULTURE, BLOOD (ROUTINE X 2)     Status: None   Collection Time    03/21/14  5:40 PM      Result Value Ref Range Status   Specimen Description BLOOD LEFT FOREARM   Final   Special Requests BOTTLES DRAWN AEROBIC ONLY 1CC   Final   Culture   Setup Time     Final   Value: 03/21/2014 23:04     Performed at [facility]   Culture     Final   Value: NO GROWTH 5 DAYS     Performed at [facility]   Report Status 03/27/2014 FINAL   Final  MRSA PCR SCREENING     Status: None   Collection Time    03/21/14 10:49 PM      Result Value Ref Range Status   MRSA by PCR NEGATIVE  NEGATIVE Final   Comment:            The GeneXpert MRSA Assay (FDA     approved for NASAL specimens     only), is one component of a     comprehensive MRSA colonization     surveillance program. It is not     intended to diagnose MRSA     infection nor to guide or     monitor treatment for     MRSA infections.  URINE CULTURE     Status: None   Collection Time    03/23/14  9:00 PM      Result Value Ref Range Status   Specimen Description URINE, CLEAN CATCH  Final   Special Requests NONE   Final   Culture  Setup Time     Final   Value: 03/23/2014 21:40     Performed at [facility]   Colony Count     Final   Value: NO GROWTH     Performed at [facility]   Culture     Final   Value: NO GROWTH     Performed at [facility]   Report Status 03/25/2014 FINAL   Final    Anti-infectives   Start     Dose/Rate Route Frequency Ordered Stop   03/24/14 1430  imipenem-cilastatin (PRIMAXIN) 250 mg in sodium chloride 0.9 % 100 mL IVPB  Status:  Discontinued     250 mg 200 mL/hr over 30 Minutes Intravenous 4 times per day 03/24/14 1423 03/28/14 1107   03/23/14 1700  azithromycin (ZITHROMAX) tablet 500 mg  Status:  Discontinued     500 mg Oral Daily 03/23/14 1533 03/24/14 1413   03/22/14 2000  vancomycin (VANCOCIN) IVPB 1000 mg/200 mL premix  Status:  Discontinued     1,000 mg 200 mL/hr over 60 Minutes Intravenous Every 24 hours 03/21/14 1840 03/24/14 1413   03/22/14 1700  azithromycin (ZITHROMAX) 500 mg in dextrose 5 % 250 mL IVPB  Status:  Discontinued     500 mg 250 mL/hr over 60 Minutes Intravenous Every 24 hours  03/22/14 1630 03/23/14 1532   03/22/14 0200  aztreonam (AZACTAM) 1 g in dextrose 5 % 50 mL IVPB  Status:  Discontinued     1 g 100 mL/hr over 30 Minutes Intravenous 3 times per day 03/21/14 1840 03/24/14 1413   03/21/14 1730  aztreonam (AZACTAM) 2 g in dextrose 5 % 50 mL IVPB     2 g 100 mL/hr over 30 Minutes Intravenous  Once 03/21/14 1718 03/21/14 1847   03/21/14 1730  vancomycin (VANCOCIN) IVPB 1000 mg/200 mL premix     1,000 mg 200 mL/hr over 60 Minutes Intravenous  Once 03/21/14 1718 03/21/14 2018      Assessment: 89yom on Primaxin Day 4 (Day 7 of total antibiotics) for suspected PNA. Patient is currently afebrile, WBC wnl and cultures have reported no significant growth. CCM has consulted pharmacy to transition patient to Ceftriaxone to complete 8 days of antibiotics. Patient does have PCN allergy (ulcers in mouth) but OK to use Ceftriaxone per CCM PA Ollis.    Plan:  1. Ceftriaxone 1g IV q24h x 2 days (will complete 8 days total antibiotics) 2. Pharmacy will sign off. Please reconsult if additional assistance is needed.   Cleon DewDulaney, [place] [phone] 03/28/2014,11:46 AM

## 2014-03-28 NOTE — Progress Notes (Signed)
PULMONARY  / CRITICAL CARE MEDICINE CONSULTATION   Name: Jim Contreras MRN: [id number] DOB: 1923/08/30    ADMISSION DATE:  03/21/2014 CONSULTATION DATE: 03/24/2014  REQUESTING CLINICIAN: Dr. Susie Cassette PRIMARY SERVICE: TRH  CHIEF COMPLAINT:  SOB  BRIEF PATIENT DESCRIPTION: 80 M with COPD/ILD on chronic O2, patient of PW, who presented 10/8 with SOB likely 2/2 COPD exacerbation.  SIGNIFICANT EVENTS / STUDIES:  10/08 ABG > 7.491/28.6/51.0 10/11 Chest X-ray > with worsening bibasilar opacities concerning for pneumonia 10/15 On SDU, requiring increased O2, Partial NRB  LINES / TUBES: PIV  CULTURES: Blood Cultures 10/8 >> Urine Culture 10/8 >> Flu Panel 10/8 >> Negative  ANTIBIOTICS: Imipenem 10/8 >>  Azithro 10/8-10/11 Aztreonam 10/8 - 10/11 Vanc 10/8-10/11  SUBJECTIVE: PT reports pt walked approx 100 ft with high flow O2 and tolerated well.   VITAL SIGNS: Temp:  [97.5 F (36.4 C)-98 F (36.7 C)] 98 F (36.7 C) (10/15 0723) Pulse Rate:  [58-79] 58 (10/15 0531) Resp:  [16-23] 16 (10/15 0531) BP: (107-141)/(52-57) 141/57 mmHg (10/15 0531) SpO2:  [87 %-98 %] 92 % (10/15 0723) FiO2 (%):  [100 %] 100 % (10/15 0723) Weight:  [152 lb 1.9 oz (69 kg)] 152 lb 1.9 oz (69 kg) (10/15 0500)  HEMODYNAMICS:    VENTILATOR SETTINGS: Vent Mode:  [-]  FiO2 (%):  [100 %] 100 %  INTAKE / OUTPUT: Intake/Output     10/14 0701 - 10/15 0700 10/15 0701 - 10/16 0700   P.O. 241    IV Piggyback     Total Intake(mL/kg) 241 (3.5)    Urine (mL/kg/hr) 151 (0.1)    Stool 1 (0)    Total Output 152     Net +89          Urine Occurrence 4 x      PHYSICAL EXAMINATION: General:  Elderly male in NAD Neuro:  Grossly intact HEENT:  Sclera anicteric, conjunctiva pink, MMM, OP clear Neck: Trachea supple and midline, (-) LAN or JVD Cardiovascular:  RRR, nS1/S2, (-) MRG Lungs:  Distant with mild rales bilaterally at the bases Abdomen:  S/NT/ND/(+)BS Musculoskeletal:  (-) C/C/E Skin:  Grossly  Intact  LABS:  CBC  Recent Labs Lab 03/22/14 0253 03/25/14 0335 03/27/14 0332  WBC 12.2* 7.8 9.9  HGB 12.5* 12.0* 13.4  HCT 39.4 38.0* 42.0  PLT 171 194 204   BMET  Recent Labs Lab 03/25/14 0335 03/26/14 0232 03/27/14 0332  NA 142 142 140  K 3.6* 3.5* 4.3  CL 104 100 102  CO2 25 30 28   BUN 31* 31* 31*  CREATININE 1.12 0.97 0.92  GLUCOSE 137* 103* 110*   Electrolytes  Recent Labs Lab 03/25/14 0335 03/26/14 0232 03/27/14 0332  CALCIUM 8.3* 8.5 8.3*  MG  --  2.3  --   PHOS  --  2.7  --    Sepsis Markers  Recent Labs Lab 03/21/14 1747 03/21/14 2030  LATICACIDVEN 1.45 0.58   ABG  Recent Labs Lab 03/21/14 1747  PHART 7.491*  PCO2ART 28.6*  PO2ART 51.0*   Liver Enzymes  Recent Labs Lab 03/22/14 0253 03/25/14 0335 03/27/14 0332  AST 15 22 18   ALT 8 16 20   ALKPHOS 51 41 43  BILITOT 0.5 0.3 0.3  ALBUMIN 2.7* 2.6* 2.6*   Cardiac Enzymes  Recent Labs Lab 03/24/14 2158  PROBNP 1012.0*   Glucose  Recent Labs Lab 03/27/14 1118  GLUCAP 130*    Imaging Ct Angio Chest Pe W/cm &/or Wo Cm  03/26/2014  CLINICAL DATA:  80 -year-old male with acute shortness of breath. Intermediate V/Q scan. Initial encounter.  EXAM: CT ANGIOGRAPHY CHEST WITH CONTRAST  TECHNIQUE: Multidetector CT imaging of the chest was performed using the standard protocol during bolus administration of intravenous contrast. Multiplanar CT image reconstructions and MIPs were obtained to evaluate the vascular anatomy.  CONTRAST:  62mL OMNIPAQUE IOHEXOL 350 MG/ML SOLN  COMPARISON:  High-resolution Chest CT 05/01/2013. Portable chest x-ray 03/24/2014. V/Q scan 03/25/2014.  FINDINGS: Good contrast bolus timing in the pulmonary arterial tree. Respiratory motion artifact at both lung bases. No focal filling defect identified in the pulmonary arterial tree to suggest the presence of acute pulmonary embolism.  Chronic emphysema. Mildly lower lung volumes compared to 2014.  Central airways are patent except for a atelectatic changes. Small bilateral layering pleural effusions. Combination of compressive lower lobe atelectasis and also some patchy right lower lobe pulmonary opacity. In the peripheral right lower lobe there is hyperdense opacity (series 4, image 73 which might be previously aspirated oral contrast, uncertain. However, this was also present to a degree in 2014 and at that time alveolar or microlithiasis was considered. Chronic peripheral increased reticular opacity in the mid lungs is stable.  No pericardial effusion. Calcified coronary artery and aortic atherosclerosis. Stable small right peritracheal and mediastinal lymph nodes. No axillary lymphadenopathy. Negative thoracic inlet.  Negative visualized liver, spleen, pancreas, adrenal glands, kidneys. New moderate-sized gastric hiatal hernia.  Chronic left costovertebral angle rib fractures (e.g. Left seventh rib series 6, image 38). Osteopenia. Mild T12 compression fracture appears nonacute but increased since 2014. No acute osseous abnormality identified.  Review of the MIP images confirms the above findings.  IMPRESSION: 1.  No evidence of acute pulmonary embolus. 2. Chronic emphysema and interstitial lung disease with superimposed small pleural effusions and acute right lower lobe opacity suspicious for pneumonia. 3. Superimposed chronic and progressed right lower lobe hyperdense material with a diagnosis of alveolar microlithiasis considered on the prior study of 2014. 4. Chronic but progressed T12 compression fracture.   Electronically Signed   By: Augusto GambleLee  Hall M.D.   On: 03/26/2014 13:28   Dg Swallowing Func-speech Pathology  03/27/2014   Carolan ShiverAmanda Laurice Couture, CCC-SLP     03/27/2014  2:45 PM Objective Swallowing Evaluation: Modified Barium Swallowing Study   Patient Details  Name: Jim Contreras MRN: [id number] Date of Birth: Mar 15, 1924  Today's Date: 03/27/2014 Time: [id number] SLP Time Calculation (min): 25  min  Past Medical History:  Past Medical History  Diagnosis Date  . Hypertension   . Hypercholesteremia   . BPH (benign prostatic hyperplasia)   . ILD (interstitial lung disease)   . COPD, moderate   . Stroke     Thalamic   Past Surgical History:  Past Surgical History  Procedure Laterality Date  . Cataract extraction Bilateral   . Appendectomy    . Hernia repair    . Flexible sigmoidoscopy N/A 04/24/2013    Procedure: FLEXIBLE SIGMOIDOSCOPY;  Surgeon: Shirley FriarVincent C.  Schooler, MD;  [facility];  Service: Endoscopy;   Laterality: N/A;   HPI:  80 yo male admitted for SOB, productive cough, and confusion with  LLL airspace disease per CXR. PMH includes COPD, ILD, GERD,  hypertension. Pt had MBS in 2013 with deep, silent penetration to  the vocal cords with large sips of thin liquid. SLP recommended a  regular diet and thin liquids with small, single sips to increase  airway protection.  Pt has been followed for several days  with  increasing concerns for aspiration.  Has required a partial  nonrebreather.  Recent CXR showed worsened biltaeral basilar  predominant opacities.  Has been coughing with meals, due in part  to rushing rather than eating more slowly and allowing time for  respiration.  MBS recommended.       Assessment / Plan / Recommendation Clinical Impression  Dysphagia Diagnosis: Mild pharyngeal phase dysphagia  Clinical impression: Pt's swallow abilities appeared to be quite  functional during MBS.  Demonstrated only occasional penetration  of thin liquids, but it was not observed to reach the level of  the vocal cords.  There was mild pharyngeal residue post-swallow.   No aspiration. During assessment, pt's pace of consumption was  controlled by examiner.  When pt is functionally eating/drinking,   he has been observed to do so at rapid rates, leading to  clinical signs of aspiration.    Recommend regular diet, thin liquids but strict precautions to  minimize potential for aspiration:  SLOW RATE;  REPLACE NRB BETWEEN BOLUSES OF FOOD/LIQUID AND ALLOW  5-6 BREATHS BEFORE RESUMING EATING.    Treatment Recommendation    Continue per original plan   Diet Recommendation Regular;Thin liquid   Liquid Administration via: Cup;Straw Medication Administration: Whole meds with puree Supervision: Patient able to self feed;Intermittent supervision  to cue for compensatory strategies Compensations: Slow rate;Small sips/bites (breathe 5-6 times  between swallows) Postural Changes and/or Swallow Maneuvers: Seated upright 90  degrees    Other  Recommendations Oral Care Recommendations: Oral care BID   Follow Up Recommendations   (tba)                SLP Swallow Goals     General Date of Onset: 03/27/14 HPI: 80 yo male admitted for SOB, productive cough, and confusion  with LLL airspace disease per CXR. PMH includes COPD, ILD, GERD,  hypertension. Pt had MBS in 2013 with deep, silent penetration to  the vocal cords with large sips of thin liquid. SLP recommended a  regular diet and thin liquids with small, single sips to increase  airway protection.  Pt has been followed for several days with  increasing concerns for aspiration.  Has required a partial  nonrebreather.  Recent CXR showed worsened biltaeral basilar  predominant opacities.  Has been coughing with meals, due in part  to rushing rather than eating more slowly and allowing time for  respiration.  MBS recommended.   Type of Study: Modified Barium Swallowing Study Reason for Referral: Objectively evaluate swallowing function Previous Swallow Assessment: see HPI Diet Prior to this Study: Dysphagia 3 (soft);Nectar-thick liquids Temperature Spikes Noted: No Respiratory Status: non-rebreather History of Recent Intubation: No Behavior/Cognition: Alert;Cooperative Oral Cavity - Dentition: Adequate natural dentition Oral Motor / Sensory Function: Within functional limits Self-Feeding Abilities: Able to feed self Patient Positioning: Upright in chair Baseline Vocal Quality:  Clear Volitional Cough: Strong Volitional Swallow: Able to elicit Anatomy: Within functional limits Pharyngeal Secretions: Not observed secondary MBS    Reason for Referral Objectively evaluate swallowing function   Oral Phase Oral Preparation/Oral Phase Oral Phase: WFL   Pharyngeal Phase Pharyngeal Phase Pharyngeal Phase: Impaired Pharyngeal - Thin Pharyngeal - Thin Cup: Delayed swallow  initiation;Penetration/Aspiration during swallow;Pharyngeal  residue - valleculae Penetration/Aspiration details (thin cup): Material enters  airway, remains ABOVE vocal cords and not ejected out Pharyngeal - Solids Pharyngeal - Puree: Premature spillage to valleculae;Pharyngeal  residue - valleculae Pharyngeal - Regular: Delayed swallow initiation;Pharyngeal  residue - valleculae  Cervical Esophageal  Phase    Amanda L. [name], [place] CCC/SLP Pager [phone]              Blenda MountsCouture, Amanda Laurice 03/27/2014, 2:40 PM     CXR: Personally reviewed. Worsened biltaeral basilar predominant opacities.   ASSESSMENT / PLAN:  Acute on Chronic Hypoxic Respiratory Failure: With elevated WBC count on presentation and progressive bibasilar infiltrates.. Pneumonia can also explain the worsened falls noted recently. No active wheeze. Consider flare ILD, cardiac or non-cardiac edema.   Plan: -Agree with abx, change to cephalosporin,  would complete a total course of 8 days of antibiotics  -prednisone 40 mg daily to complete 5 days -Continue Duonebs and PRN albuterol -Dose lasix daily as renal fxn / BP permit.  -wean O2 for sats > 90% -mobilize as able, appreciate PT -SLP following    Canary BrimBrandi Ollis, NP-C [facility] Pgr: [phone] or [phone]   I have personally obtained a history, examined the patient, evaluated laboratory and imaging results, formulated the assessment and plan and placed orders.   Levy Pupaobert Danicia Terhaar, MD, PhD 03/28/2014, 12:07 PM [facility] [phone] or if no answer  [phone]

## 2014-03-28 NOTE — Clinical Social Work Note (Signed)
CSW provided Ms. Harris with an update regarding the pt condition. When the pt is medically [facility] CSW will continue to support and assist with discharge.    Airrion Otting, MSW, LCSWA  [phone]

## 2014-03-28 NOTE — Progress Notes (Signed)
Physical Therapy Treatment Patient Details Name: Jim QuintJames R Contreras MRN: [id number] DOB: 03-Jun-1924 Today's Date: 03/28/2014    History of Present Illness 80 y.o. male with Past medical history of hypertension, COPD with chronic resp failure on chronic o2 3LPM, CVA, interstitial lung disease, BPH, diastolic dysfunction, pulmonary hypertension.   Pt adm due to worsening of SOB. Pt with Acute on chronic respiratory failure with hypoxemia.     PT Comments    Pt progressing towards goals. Pt able to amb 60' with mod A +2 for equipment and tolerate LE therex well. During standing activities, pt with heavy posterior lean, requiring assistance to prevent falling back. Pt on 10L O2 via NRB in room and 15L O2 via NRB for ambulation to maintain O2 sat >90%.  Current d/c recommendation remains appropriate.   Follow Up Recommendations  SNF     Equipment Recommendations  Rolling walker with 5" wheels    Recommendations for Other Services       Precautions / Restrictions Precautions Precautions: Fall Precaution Comments: monitor O2 sats Restrictions Weight Bearing Restrictions: No    Mobility  Bed Mobility Overal bed mobility: Needs Assistance Bed Mobility: Supine to Sit     Supine to sit: Supervision     General bed mobility comments: O2 sat above 90% on 10L via NRB  Transfers Overall transfer level: Needs assistance Equipment used: Rolling walker (2 wheeled) Transfers: Sit to/from Stand Sit to Stand: Mod assist;+2 safety/equipment         General transfer comment: assistance to power up. pt with posterior lean and leaning LE against bed, facilitation of hip extensors to reduce lean and encourage weightbearing through entire foot. O2 sat above 90% on 10L O2 via NRB. VC for technique, but pt non compliant.   Ambulation/Gait Ambulation/Gait assistance: Mod assist;+2 safety/equipment Ambulation Distance (Feet): 50 Feet Assistive device: Rolling walker (2 wheeled) Gait  Pattern/deviations: Step-through pattern;Decreased step length - left;Decreased stride length;Narrow base of support;Leaning posteriorly Gait velocity: decreased Gait velocity interpretation: Below normal speed for age/gender General Gait Details: VC for technique and assist for management of RW. Faciliatation of hip extensors to reduce trunk forward flexion. O2sat maintained above 95% on 15L O2 via NRB.    Stairs            Wheelchair Mobility    Modified Rankin (Stroke Patients Only)       Balance Overall balance assessment: Needs assistance Sitting-balance support: Feet supported Sitting balance-Leahy Scale: Fair Sitting balance - Comments: pt able to maintain balance without UE support, but can not tolerate challenge. Pt reporting "vertigo" with initial sitting EOB, sx reduced with continued sitting. Postural control: Posterior lean Standing balance support: During functional activity;Bilateral upper extremity supported Standing balance-Leahy Scale: Poor Standing balance comment: unable to maintain balance without support.                     Cognition Arousal/Alertness: Awake/alert Behavior During Therapy: WFL for tasks assessed/performed Overall Cognitive Status: No family/caregiver present to determine baseline cognitive functioning                      Exercises General Exercises - Lower Extremity Ankle Circles/Pumps: AROM;20 reps;Supine;Both Long Arc Quad: AROM;15 reps;Both;Seated Hip ABduction/ADduction: AROM;Both;10 reps;Seated (long sitting )    General Comments General comments (skin integrity, edema, etc.): Pt with 2 instance of incontinence during session, pt bathed and cleaned up. When recieved pt, pt on RA to take medications, O2 sats dropped to high 70s%,  increased to 90s% and maintained >90% for rest of session once NRB reapplied. Pt encouraged to continue LE exercises performed during session, requires VC to slow down during exercises.         Pertinent Vitals/Pain Pain Assessment: 0-10 Pain Score:  (not rated) Pain Location: R side trunk Pain Descriptors / Indicators: Aching Pain Intervention(s): Limited activity within patient's tolerance;Monitored during session    Home Living                      Prior Function            PT Goals (current goals can now be found in the care plan section) Acute Rehab PT Goals PT Goal Formulation: With patient Time For Goal Achievement: 03/31/14 Potential to Achieve Goals: Fair Progress towards PT goals: Progressing toward goals    Frequency  Min 3X/week    PT Plan Current plan remains appropriate    Co-evaluation             End of Session Equipment Utilized During Treatment: Gait belt;Oxygen Activity Tolerance: Patient limited by fatigue;Patient tolerated treatment well Patient left: with chair alarm set;with call bell/phone within reach     Time: 1013-1052 PT Time Calculation (min): 39 min  Charges:                       G Codes:      Tariq Pernell 03/28/2014, 11:23 AM Cathlyn ParsonsElizabeth Jamier Urbas, SPT

## 2014-03-29 LAB — COMPREHENSIVE METABOLIC PANEL
ALBUMIN: 2.6 g/dL — AB (ref 3.5–5.2)
ALT: 14 U/L (ref 0–53)
ANION GAP: 11 (ref 5–15)
AST: 14 U/L (ref 0–37)
Alkaline Phosphatase: 49 U/L (ref 39–117)
BUN: 32 mg/dL — AB (ref 6–23)
CALCIUM: 8.3 mg/dL — AB (ref 8.4–10.5)
CO2: 29 mEq/L (ref 19–32)
CREATININE: 1.27 mg/dL (ref 0.50–1.35)
Chloride: 99 mEq/L (ref 96–112)
GFR calc Af Amer: 56 mL/min — ABNORMAL LOW (ref >90)
GFR calc non Af Amer: 49 mL/min — ABNORMAL LOW (ref >90)
Glucose, Bld: 105 mg/dL — ABNORMAL HIGH (ref 70–99)
Potassium: 4.2 mEq/L (ref 3.7–5.3)
Sodium: 139 mEq/L (ref 137–147)
TOTAL PROTEIN: 6 g/dL (ref 6.0–8.3)
Total Bilirubin: 0.4 mg/dL (ref 0.3–1.2)

## 2014-03-29 MED ORDER — ALBUTEROL SULFATE (2.5 MG/3ML) 0.083% IN NEBU: 2.5000 mg | INHALATION_SOLUTION | RESPIRATORY_TRACT | Status: DC

## 2014-03-29 MED ORDER — FUROSEMIDE 40 MG PO TABS
60.0000 mg | ORAL_TABLET | Freq: Every day | ORAL | Status: DC
  Administered 2014-03-30 – 2014-04-03 (×5): 60 mg via ORAL
  Filled 2014-03-29 (×7): qty 1

## 2014-03-29 MED ORDER — FUROSEMIDE 40 MG PO TABS
40.0000 mg | ORAL_TABLET | Freq: Every day | ORAL | Status: DC
  Administered 2014-03-29: 40 mg via ORAL
  Filled 2014-03-29: qty 1

## 2014-03-29 MED ORDER — FUROSEMIDE 40 MG PO TABS: 40.0000 mg | ORAL_TABLET | Freq: Every day | ORAL | Status: DC

## 2014-03-29 MED ORDER — ALBUTEROL SULFATE (2.5 MG/3ML) 0.083% IN NEBU: 2.5000 mg | INHALATION_SOLUTION | RESPIRATORY_TRACT | Status: DC | PRN

## 2014-03-29 MED ORDER — ENSURE COMPLETE PO LIQD
237.0000 mL | Freq: Two times a day (BID) | ORAL | Status: DC
  Administered 2014-03-30 – 2014-04-05 (×9): 237 mL via ORAL

## 2014-03-29 NOTE — Progress Notes (Signed)
PULMONARY  / CRITICAL CARE MEDICINE CONSULTATION   Name: Jim Contreras Townsel MRN: [id number] DOB: [birth year]    ADMISSION DATE:  03/21/2014 CONSULTATION DATE: 03/24/2014  REQUESTING CLINICIAN: Dr. Susie CassetteAbrol PRIMARY SERVICE: TRH  CHIEF COMPLAINT:  SOB  BRIEF PATIENT DESCRIPTION: [age] M with COPD/ILD on chronic O2, patient of PW, who presented 10/8 with SOB likely 2/2 COPD exacerbation.  SIGNIFICANT EVENTS / STUDIES:  10/08 ABG > 7.491/28.6/51.0 10/11 Chest X-ray > with worsening bibasilar opacities concerning for pneumonia 10/15 On SDU, requiring increased O2, Partial NRB  LINES / TUBES: PIV  CULTURES:  Blood Cultures 10/8 >> Negative Urine Culture 10/8 >> Negative Flu Panel 10/8 >> Negative   SUBJECTIVE:  A bit stronger 87% on 5L/min [place]   VITAL SIGNS: Temp:  [97.3 F (36.3 C)-98 F (36.7 C)] 97.3 F (36.3 C) (10/16 0358) Pulse Rate:  [70-95] 70 (10/16 0400) Resp:  [16-20] 19 (10/16 0400) BP: (95-122)/(42-62) 121/60 mmHg (10/16 0824) SpO2:  [78 %-95 %] 90 % (10/16 0934) FiO2 (%):  [55 %-100 %] 80 % (10/15 1844)  HEMODYNAMICS:    VENTILATOR SETTINGS: Vent Mode:  [-]  FiO2 (%):  [55 %-100 %] 80 %  INTAKE / OUTPUT: Intake/Output     10/15 0701 - 10/16 0700 10/16 0701 - 10/17 0700   P.O. 600    Total Intake(mL/kg) 600 (8.7)    Urine (mL/kg/hr) 200 (0.1)    Stool     Total Output 200     Net +400          Urine Occurrence 4 x    Stool Occurrence 1 x      PHYSICAL EXAMINATION: General:  Elderly male in NAD Neuro:  Grossly intact HEENT:  Sclera anicteric, conjunctiva pink, MMM, OP clear Neck: Trachea supple and midline, (-) LAN or JVD Cardiovascular:  RRR, nS1/S2, (-) MRG Lungs:  Distant with mild rales bilaterally at the bases Abdomen:  S/NT/ND/(+)BS Musculoskeletal:  (-) C/C/E Skin:  Grossly Intact  LABS:  CBC  Recent Labs Lab 03/25/14 0335 03/27/14 0332  WBC 7.8 9.9  HGB 12.0* 13.4  HCT 38.0* 42.0  PLT 194 204   BMET  Recent Labs Lab  03/26/14 0232 03/27/14 0332 03/29/14 0252  NA 142 140 139  K 3.5* 4.3 4.2  CL 100 102 99  CO2 30 28 29   BUN 31* 31* 32*  CREATININE 0.97 0.92 1.27  GLUCOSE 103* 110* 105*   Electrolytes  Recent Labs Lab 03/26/14 0232 03/27/14 0332 03/29/14 0252  CALCIUM 8.5 8.3* 8.3*  MG 2.3  --   --   PHOS 2.7  --   --    Sepsis Markers No results found for this basename: LATICACIDVEN, PROCALCITON, O2SATVEN,  in the last 168 hours ABG No results found for this basename: PHART, PCO2ART, PO2ART,  in the last 168 hours Liver Enzymes  Recent Labs Lab 03/25/14 0335 03/27/14 0332 03/29/14 0252  AST 22 18 14   ALT 16 20 14   ALKPHOS 41 43 49  BILITOT 0.3 0.3 0.4  ALBUMIN 2.6* 2.6* 2.6*   Cardiac Enzymes  Recent Labs Lab 03/24/14 2158  PROBNP 1012.0*   Glucose  Recent Labs Lab 03/27/14 1118  GLUCAP 130*    Imaging Dg Swallowing Func-speech Pathology  03/27/2014   Carolan ShiverAmanda Laurice Couture, CCC-SLP     03/27/2014  2:45 PM Objective Swallowing Evaluation: Modified Barium Swallowing Study   Patient Details  Name: Jim Contreras Mitter MRN: [id number] Date of Birth: [birth year]  Today's Date: 03/27/2014 Time:  [id number] SLP Time Calculation (min): 25 min  Past Medical History:  Past Medical History  Diagnosis Date  . Hypertension   . Hypercholesteremia   . BPH (benign prostatic hyperplasia)   . ILD (interstitial lung disease)   . COPD, moderate   . Stroke     Thalamic   Past Surgical History:  Past Surgical History  Procedure Laterality Date  . Cataract extraction Bilateral   . Appendectomy    . Hernia repair    . Flexible sigmoidoscopy N/A 04/24/2013    Procedure: FLEXIBLE SIGMOIDOSCOPY;  Surgeon: Shirley FriarVincent C.  Schooler, MD;  [facility];  Service: Endoscopy;   Laterality: N/A;   HPI:  80 yo male admitted for SOB, productive cough, and confusion with  LLL airspace disease per CXR. PMH includes COPD, ILD, GERD,  hypertension. Pt had MBS in 2013 with deep, silent penetration to  the vocal  cords with large sips of thin liquid. SLP recommended a  regular diet and thin liquids with small, single sips to increase  airway protection.  Pt has been followed for several days with  increasing concerns for aspiration.  Has required a partial  nonrebreather.  Recent CXR showed worsened biltaeral basilar  predominant opacities.  Has been coughing with meals, due in part  to rushing rather than eating more slowly and allowing time for  respiration.  MBS recommended.       Assessment / Plan / Recommendation Clinical Impression  Dysphagia Diagnosis: Mild pharyngeal phase dysphagia  Clinical impression: Pt's swallow abilities appeared to be quite  functional during MBS.  Demonstrated only occasional penetration  of thin liquids, but it was not observed to reach the level of  the vocal cords.  There was mild pharyngeal residue post-swallow.   No aspiration. During assessment, pt's pace of consumption was  controlled by examiner.  When pt is functionally eating/drinking,   he has been observed to do so at rapid rates, leading to  clinical signs of aspiration.    Recommend regular diet, thin liquids but strict precautions to  minimize potential for aspiration:  SLOW RATE; REPLACE NRB BETWEEN BOLUSES OF FOOD/LIQUID AND ALLOW  5-6 BREATHS BEFORE RESUMING EATING.    Treatment Recommendation    Continue per original plan   Diet Recommendation Regular;Thin liquid   Liquid Administration via: Cup;Straw Medication Administration: Whole meds with puree Supervision: Patient able to self feed;Intermittent supervision  to cue for compensatory strategies Compensations: Slow rate;Small sips/bites (breathe 5-6 times  between swallows) Postural Changes and/or Swallow Maneuvers: Seated upright 90  degrees    Other  Recommendations Oral Care Recommendations: Oral care BID   Follow Up Recommendations   (tba)                SLP Swallow Goals     General Date of Onset: 03/27/14 HPI: 80 yo male admitted for SOB, productive cough, and  confusion  with LLL airspace disease per CXR. PMH includes COPD, ILD, GERD,  hypertension. Pt had MBS in 2013 with deep, silent penetration to  the vocal cords with large sips of thin liquid. SLP recommended a  regular diet and thin liquids with small, single sips to increase  airway protection.  Pt has been followed for several days with  increasing concerns for aspiration.  Has required a partial  nonrebreather.  Recent CXR showed worsened biltaeral basilar  predominant opacities.  Has been coughing with meals, due in part  to rushing rather than eating more slowly and allowing time  for  respiration.  MBS recommended.   Type of Study: Modified Barium Swallowing Study Reason for Referral: Objectively evaluate swallowing function Previous Swallow Assessment: see HPI Diet Prior to this Study: Dysphagia 3 (soft);Nectar-thick liquids Temperature Spikes Noted: No Respiratory Status: non-rebreather History of Recent Intubation: No Behavior/Cognition: Alert;Cooperative Oral Cavity - Dentition: Adequate natural dentition Oral Motor / Sensory Function: Within functional limits Self-Feeding Abilities: Able to feed self Patient Positioning: Upright in chair Baseline Vocal Quality: Clear Volitional Cough: Strong Volitional Swallow: Able to elicit Anatomy: Within functional limits Pharyngeal Secretions: Not observed secondary MBS    Reason for Referral Objectively evaluate swallowing function   Oral Phase Oral Preparation/Oral Phase Oral Phase: WFL   Pharyngeal Phase Pharyngeal Phase Pharyngeal Phase: Impaired Pharyngeal - Thin Pharyngeal - Thin Cup: Delayed swallow  initiation;Penetration/Aspiration during swallow;Pharyngeal  residue - valleculae Penetration/Aspiration details (thin cup): Material enters  airway, remains ABOVE vocal cords and not ejected out Pharyngeal - Solids Pharyngeal - Puree: Premature spillage to valleculae;Pharyngeal  residue - valleculae Pharyngeal - Regular: Delayed swallow initiation;Pharyngeal   residue - valleculae  Cervical Esophageal Phase    Amanda L. Samson Frederic, [place] CCC/SLP Pager [phone]              Blenda Mounts Laurice 03/27/2014, 2:40 PM     CXR: Personally reviewed. Worsened biltaeral basilar predominant opacities.   ASSESSMENT / PLAN:  Acute on Chronic Hypoxic Respiratory Failure: With elevated WBC count on presentation and progressive bibasilar infiltrates.. Pneumonia can also explain the worsened falls noted recently. No active wheeze. Consider flare ILD, cardiac or non-cardiac edema.  ILD with possible flare, ? A contribution of chronic aspiration  Severe COPD / emphysema  Plan: -complete azithro + ceftriaxone on 10/16, day 8 of 8 abx -prednisone course completed -Continue Duonebs and PRN albuterol -Dose lasix daily as renal fxn / BP permit.  -wean O2 for sats > 90% -mobilize as able, appreciate PT -SLP > no overt aspiration but at risk when he eats quickly; aspiration precautions instituted -I would accept SpO2 of > 85%. Will ask for an oximizer and uptitrate his O2    I have personally obtained a history, examined the patient, evaluated laboratory and imaging results, formulated the assessment and plan and placed orders.   Levy Pupa, MD, PhD 03/29/2014, 10:14 AM [facility] [phone] or if no answer [phone]

## 2014-03-29 NOTE — Progress Notes (Signed)
Occupational Therapy Treatment Patient Details Name: Georgina QuintJames R Quayle MRN: [id number] DOB: Mar 27, 1924 Today's Date: 03/29/2014    History of present illness 80 y.o. male with Past medical history of hypertension, COPD with chronic resp failure on chronic o2 3LPM, CVA, interstitial lung disease, BPH, diastolic dysfunction, pulmonary hypertension.   Pt adm due to worsening of SOB. Pt with Acute on chronic respiratory failure with hypoxemia.    OT comments  Pt on 6L today. Pt required increased assistance with mobility and ADL today as compared to previous session. Desat to low 80s with minimal activity on 6L. Increased to 7L, taking @ 2-3 min to rebound to 85. Pt encouraged to stay OOB for at least an hour.Pt states his visitors "wore him out"  Follow Up Recommendations  SNF;Supervision/Assistance - 24 hour    Equipment Recommendations  None recommended by OT    Recommendations for Other Services      Precautions / Restrictions Precautions Precautions: Fall Precaution Comments: monitor O2 sats       Mobility Bed Mobility Overal bed mobility: Needs Assistance Bed Mobility: Supine to Sit     Supine to sit: Mod assist     General bed mobility comments: desat to low 80s 6L  Transfers Overall transfer level: Needs assistance Equipment used: Rolling walker (2 wheeled) Transfers: Sit to/from [employer]GI CorporationStand;Stand Pivot Transfers Sit to Stand: Max assist Stand pivot transfers: Max assist       General transfer comment: increased assistance required today    Balance Overall balance assessment: Needs assistance Sitting-balance support: Feet supported;Bilateral upper extremity supported Sitting balance-Leahy Scale: Fair     Standing balance support: During functional activity;Bilateral upper extremity supported Standing balance-Leahy Scale: Zero                     ADL           Upper Body Bathing: Sitting;Minimal assitance   Lower Body Bathing: Moderate  assistance;Sit to/from stand (mod Assist for balance. posterior lean)    urinary urgency - using urinal                   Functional mobility during ADLs: Rolling walker;Moderate assistance (ambulation) General ADL Comments: Pt limited by poor endurance. desat into low 80s after walking from recliner to sink. several minutes to rebound to 88. Sat to participate in bathing. significant posterior lean during all mobility. Ambulated back to recliner. O2 sats 81-83, O2 increased to 7L. Rebound > 3 min      Vision                     Perception     Praxis      Cognition   Behavior During Therapy: WFL for tasks assessed/performed Overall Cognitive Status: History of cognitive impairments - at baseline                       Extremity/Trunk Assessment               Exercises  Pt states he has been performing his exercises that we did last session   Shoulder Instructions       General Comments      Pertinent Vitals/ Pain       Pain Assessment: Faces Faces Pain Scale: Hurts little more Pain Location: r chest Pain Descriptors / Indicators: Jabbing Pain Intervention(s): Other (comment) (pain only when coughing)  Home Living  Prior Functioning/Environment              Frequency Min 2X/week     Progress Toward Goals  OT Goals(current goals can now be found in the care plan section)  Progress towards OT goals: Progressing toward goals  Acute Rehab OT Goals Patient Stated Goal: to eventually retun home to live OT Goal Formulation: With patient Time For Goal Achievement: 04/09/14 Potential to Achieve Goals: Good ADL Goals Pt Will Perform Upper Body Bathing: sitting;with set-up Pt Will Transfer to Toilet: with min assist;bedside commode;stand pivot transfer Pt Will Perform Toileting - Clothing Manipulation and hygiene: with min assist;sitting/lateral leans Pt/caregiver will Perform  Home Exercise Program: Both right and left upper extremity;With theraband;With Supervision;Increased strength Additional ADL Goal #1: demonstrate 1 energy conservation technique during ADL task with min vc  Plan Discharge plan remains appropriate    Co-evaluation                 End of Session Equipment Utilized During Treatment: Oxygen;Gait belt;Rolling walker   Activity Tolerance Patient limited by fatigue   Patient Left in chair;with call bell/phone within reach   Nurse Communication Mobility status;Other (comment) (O2 desat)        Time: 1130-1200 OT Time Calculation (min): 30 min  Charges: OT General Charges $OT Visit: 1 Procedure OT Treatments $Self Care/Home Management : 23-37 mins  Howie Rufus,HILLARY 03/29/2014, 1:55 PM   [facility] Hospital[facility] Kellee Sittner, OTR/L  [phone] 03/29/2014

## 2014-03-29 NOTE — Progress Notes (Addendum)
[facility] PROGRESS NOTE  Jim Contreras RUE:[id number] DOB: May 24, 1924 DOA: 03/21/2014 PCP: [name], MD  Assessment/Plan: Principal Problem:   Acute on chronic respiratory failure with hypoxemia Active Problems:   COPD with emphysema Gold C    ILD (interstitial lung disease)   Pulmonary hypertension   Hypertension   Collagenous colitis   Pneumonia   Accidental fall from bed   Acute encephalopathy   Patient with COPD Gold Stage C admitted with dyspnea and acute hypoxemic resp failure    Acute on chronic hypoxemic respiratory failure secondary to left lower lobe pneumonia  Healthcare associated pneumonia  COPD exacerbation  Interstitial lung disease with pulmonary hypertension  - has seen Dr Delford Field as outpt, pulmonary service following on a daily basis, currently on nasal cannula at 6 L  Trying to wean oxygen ,  Discontinued Azithromycin, Azactam, and Vancomycin on 10/11  We switched him to Imipenem 250 mg IV q6h , on 10/15 patient was started on Rocephin and azithromycin , completing antibiotics today Completed steroids, increase Lasix to 60 mg a day Continue chest percussion therapy, out of bed to chair , start patient on incentive spirometry  Speech therapy evaluation recommends regular diet and thin liquids  VQ scan intermediate probability, therefore did CT angiography which was negative for PE  Venous Doppler of the bilateral lower extremities negative for DVT  Transfer to telemetry if able to stay on 6 L and maintain oxygenation equal to or greater than 90%  Discussed with Dr Delton Coombes  Hypokalemia replete    Chronic stable Diastolic heart failure  Discontinued IV hydration  Increase Lasix to 60 mg a day 2-D echo , showed diastolic heart failure EF of 65-70%, discussed with the patient's daughter  Follow BMP   Hypertension  Continue Norvasc, Lasix   BPH  Continue Avodart and Flomax   GERD  continue Protonix   Acute encephalopathy  Fall from  bed prior to admission-follow mental status  - PT eval recommended SNF  Fall precautions    Code Status: full  Family Communication: family updated about patient's clinical progress  Disposition Plan: Attempting to transfer out to step down and oxygenation is better , eventually will need SNF    Brief narrative:  Jim Contreras is a 80 y.o. male with Past medical history of hypertension, COPD with chronic resp failure on chronic o2 3LPM, CVA, interstitial lung disease, BPH, diastolic dysfunction, pulmonary hypertension.  The patient is presenting with complaints of shortness of breath ongoing with last 1 week progressively worsening along with cough with expectoration. He also had fever and chills. He denies any chest pain. He denies any choking episode. He is from an assisted living facility. No recent travel reported. No leg swelling or leg tenderness. No palpitation dizziness lightheadedness nausea vomiting abdominal pain diarrhea constipation burning urination.  Medication has been notified based on the documentation available from the nursing home and no recent change identified.  Initially the history was reported that the patient had a fall twice on my discussion with the nursing home staff today while the patient was coming out of the bed when his wheelchair was not locked, He fell down on the ground, no head injury or neck injury reported.  Also since last 2 days the patient has not been acting himself as per the nursing home staff.  The patient is coming from SNF. And at his baseline independent for most of his ADL.  Consultants:  None Procedures:  None Antibiotics:  Vancomycin  aztreonam/azithromycin discontinued 10/11  imipenem on 10/11-10/15  Rocephin, azithromycin started 10/15  HPI/Subjective:  States that he feels well, 90% on 5 L  Objective: Filed Vitals:   03/29/14 0358 03/29/14 0400 03/29/14 0824 03/29/14 0934  BP:  122/62 121/60   Pulse:  70    Temp: 97.3 F  (36.3 C)  97.4 F (36.3 C)   TempSrc: Oral  Oral   Resp:  19    Height:      Weight:      SpO2:  95% 92% 90%    Intake/Output Summary (Last 24 hours) at 03/29/14 1302 Last data filed at 03/29/14 0900  Gross per 24 hour  Intake    480 ml  Output      0 ml  Net    480 ml    Exam:  General: Elderly male in NAD  Neuro: Grossly intact  HEENT: Sclera anicteric, conjunctiva pink, MMM, OP clear  Neck: Trachea supple and midline, (-) LAN or JVD  Cardiovascular: RRR, nS1/S2, (-) MRG  Lungs: Distant with mild rales bilaterally at the bases  Abdomen: S/NT/ND/(+)BS  Musculoskeletal: (-) C/C/E  Skin: Grossly Intact          Data Reviewed: Basic Metabolic Panel:  Recent Labs Lab 03/25/14 0335 03/26/14 0232 03/27/14 0332 03/29/14 0252  NA 142 142 140 139  K 3.6* 3.5* 4.3 4.2  CL 104 100 102 99  CO2 25 30 28 29   GLUCOSE 137* 103* 110* 105*  BUN 31* 31* 31* 32*  CREATININE 1.12 0.97 0.92 1.27  CALCIUM 8.3* 8.5 8.3* 8.3*  MG  --  2.3  --   --   PHOS  --  2.7  --   --     Liver Function Tests:  Recent Labs Lab 03/25/14 0335 03/27/14 0332 03/29/14 0252  AST 22 18 14   ALT 16 20 14   ALKPHOS 41 43 49  BILITOT 0.3 0.3 0.4  PROT 6.1 6.2 6.0  ALBUMIN 2.6* 2.6* 2.6*   No results found for this basename: LIPASE, AMYLASE,  in the last 168 hours No results found for this basename: AMMONIA,  in the last 168 hours  CBC:  Recent Labs Lab 03/25/14 0335 03/27/14 0332  WBC 7.8 9.9  HGB 12.0* 13.4  HCT 38.0* 42.0  MCV 79.7 78.1  PLT 194 204    Cardiac Enzymes: No results found for this basename: CKTOTAL, CKMB, CKMBINDEX, TROPONINI,  in the last 168 hours BNP (last 3 results)  Recent Labs  03/24/14 2158  PROBNP 1012.0*     CBG:  Recent Labs Lab 03/27/14 1118  GLUCAP 130*    Recent Results (from the past 240 hour(s))  CULTURE, BLOOD (ROUTINE X 2)     Status: None   Collection Time    03/21/14  5:25 PM      Result Value Ref Range Status    Specimen Description BLOOD RIGHT FOREARM   Final   Special Requests BOTTLES DRAWN AEROBIC AND ANAEROBIC 5CC   Final   Culture  Setup Time     Final   Value: 03/21/2014 23:02     Performed at [facility]   Culture     Final   Value: NO GROWTH 5 DAYS     Performed at [facility]   Report Status 03/27/2014 FINAL   Final  CULTURE, BLOOD (ROUTINE X 2)     Status: None   Collection Time    03/21/14  5:40 PM  Result Value Ref Range Status   Specimen Description BLOOD LEFT FOREARM   Final   Special Requests BOTTLES DRAWN AEROBIC ONLY 1CC   Final   Culture  Setup Time     Final   Value: 03/21/2014 23:04     Performed at [facility]   Culture     Final   Value: NO GROWTH 5 DAYS     Performed at [facility]   Report Status 03/27/2014 FINAL   Final  MRSA PCR SCREENING     Status: None   Collection Time    03/21/14 10:49 PM      Result Value Ref Range Status   MRSA by PCR NEGATIVE  NEGATIVE Final   Comment:            The GeneXpert MRSA Assay (FDA     approved for NASAL specimens     only), is one component of a     comprehensive MRSA colonization     surveillance program. It is not     intended to diagnose MRSA     infection nor to guide or     monitor treatment for     MRSA infections.  URINE CULTURE     Status: None   Collection Time    03/23/14  9:00 PM      Result Value Ref Range Status   Specimen Description URINE, CLEAN CATCH   Final   Special Requests NONE   Final   Culture  Setup Time     Final   Value: 03/23/2014 21:40     Performed at [facility] Count     Final   Value: NO GROWTH     Performed at [facility]   Culture     Final   Value: NO GROWTH     Performed at [facility]   Report Status 03/25/2014 FINAL   Final     Studies: Ct Head Wo Contrast  03/21/2014   CLINICAL DATA:  Altered mental status after fall.  EXAM: CT HEAD WITHOUT CONTRAST  TECHNIQUE: Contiguous axial images were  obtained from the base of the skull through the vertex without intravenous contrast.  COMPARISON:  CT scan of December 31, 2012.  FINDINGS: Bony calvarium appears intact. Diffuse cortical atrophy is noted. Mild chronic ischemic white matter disease is noted. No mass effect or midline shift is noted. Ventricular size is within normal limits. There is no evidence of mass lesion, hemorrhage or acute infarction.  IMPRESSION: Diffuse cortical atrophy. Mild chronic ischemic white matter disease. No acute intracranial abnormality seen.   Electronically Signed   By: Roque Lias M.D.   On: 03/21/2014 18:26   Ct Angio Chest Pe W/cm &/or Wo Cm  03/26/2014   CLINICAL DATA:  51 -year-old male with acute shortness of breath. Intermediate V/Q scan. Initial encounter.  EXAM: CT ANGIOGRAPHY CHEST WITH CONTRAST  TECHNIQUE: Multidetector CT imaging of the chest was performed using the standard protocol during bolus administration of intravenous contrast. Multiplanar CT image reconstructions and MIPs were obtained to evaluate the vascular anatomy.  CONTRAST:  62mL OMNIPAQUE IOHEXOL 350 MG/ML SOLN  COMPARISON:  High-resolution Chest CT 05/01/2013. Portable chest x-ray 03/24/2014. V/Q scan 03/25/2014.  FINDINGS: Good contrast bolus timing in the pulmonary arterial tree. Respiratory motion artifact at both lung bases. No focal filling defect identified in the pulmonary arterial tree to suggest the presence of acute pulmonary embolism.  Chronic emphysema. Mildly lower  lung volumes compared to 2014. Central airways are patent except for a atelectatic changes. Small bilateral layering pleural effusions. Combination of compressive lower lobe atelectasis and also some patchy right lower lobe pulmonary opacity. In the peripheral right lower lobe there is hyperdense opacity (series 4, image 73 which might be previously aspirated oral contrast, uncertain. However, this was also present to a degree in 2014 and at that time alveolar or  microlithiasis was considered. Chronic peripheral increased reticular opacity in the mid lungs is stable.  No pericardial effusion. Calcified coronary artery and aortic atherosclerosis. Stable small right peritracheal and mediastinal lymph nodes. No axillary lymphadenopathy. Negative thoracic inlet.  Negative visualized liver, spleen, pancreas, adrenal glands, kidneys. New moderate-sized gastric hiatal hernia.  Chronic left costovertebral angle rib fractures (e.g. Left seventh rib series 6, image 38). Osteopenia. Mild T12 compression fracture appears nonacute but increased since 2014. No acute osseous abnormality identified.  Review of the MIP images confirms the above findings.  IMPRESSION: 1.  No evidence of acute pulmonary embolus. 2. Chronic emphysema and interstitial lung disease with superimposed small pleural effusions and acute right lower lobe opacity suspicious for pneumonia. 3. Superimposed chronic and progressed right lower lobe hyperdense material with a diagnosis of alveolar microlithiasis considered on the prior study of 2014. 4. Chronic but progressed T12 compression fracture.   Electronically Signed   By: Augusto GambleLee  Hall M.D.   On: 03/26/2014 13:28   Nm Pulmonary Perf And Vent  03/25/2014   CLINICAL DATA:  Acute on chronic respiratory failure. Hypoxemia. COPD. Emphysema. Encephalopathy.  EXAM: NUCLEAR MEDICINE VENTILATION - PERFUSION LUNG SCAN  TECHNIQUE: Ventilation images were obtained in multiple projections using inhaled aerosol technetium 99 M DTPA. Perfusion images were obtained in multiple projections after intravenous injection of Tc-5196m MAA.  RADIOPHARMACEUTICALS:  6.0 mCi Tc-5496m DTPA aerosol and 40 mCi Tc-9696m MAA  COMPARISON:  03/24/2014  FINDINGS: Ventilation: Abnormal accumulation of radiotracer medially near the hilum in both lungs. Hypoventilation at the right lung base and in the lingula. Hypoventilation in the lung apices.  Perfusion: The patient was apparently unable to raise the  right arm, leading to a photopenic defect on the lateral projection. Patchy moderate-sized perfusion defect posteriorly in the right lung corresponding to radiographic and ventilation abnormalities. Patchy hypoperfusion in both upper lobes.  IMPRESSION: 1. Intermediate probability of pulmonary embolus (risk 20-79%) due to triple matched large segmental defects in the lower lung zone and multiple moderate to large matched the defects in the upper lungs. There is known airspace opacity in the lung bases as well as severe emphysema especially affecting the upper lobes.   Electronically Signed   By: Herbie BaltimoreWalt  Liebkemann M.D.   On: 03/25/2014 13:22   Dg Chest Port 1 View  03/24/2014   CLINICAL DATA:  Shortness of Breath.  COPD.  Smoker.  A a seen  EXAM: PORTABLE CHEST - 1 VIEW  COMPARISON:  03/21/2014  FINDINGS: Continued left lower lobe airspace disease is similar prior study. In addition, there is increasing right basilar airspace opacity. Findings concerning for multifocal pneumonia.  Underlying COPD/hyperinflation. Heart is borderline in size. No effusions or acute bony abnormality.  IMPRESSION: Stable left lower lobe airspace disease with worsening right basilar airspace opacity. Findings concerning for worsening multifocal pneumonia.   Electronically Signed   By: Charlett NoseKevin  Dover M.D.   On: 03/24/2014 15:17   Dg Chest Port 1 View  03/21/2014   CLINICAL DATA:  Confusion fever.  Hypertension.  COPD.  Cough.  EXAM: PORTABLE CHEST -  1 VIEW  COMPARISON:  12/31/2012.  03/09/2012  FINDINGS: Midline trachea. Mild cardiomegaly with atherosclerosis in the transverse aorta. No right and no definite left pleural effusion. Lower lobe predominant interstitial thickening is moderate. Right upper lobe scarring. More confluent left lower lobe patchy airspace disease.  IMPRESSION: Left lower lobe airspace disease, suspicious for infection or aspiration. Progression of interstitial lung disease is felt less likely.  Interstitial lung  disease as was detailed on 05/01/2013 high-resolution CT. Considerations include alveolar microlithiasis superimposed upon nonspecific interstitial pneumonitis.  Cardiomegaly without congestive failure.   Electronically Signed   By: Jeronimo Greaves M.D.   On: 03/21/2014 17:58   Dg Swallowing Func-speech Pathology  03/27/2014   Carolan Shiver, CCC-SLP     03/27/2014  2:45 PM Objective Swallowing Evaluation: Modified Barium Swallowing Study   Patient Details  Name: Jim Contreras MRN: [id number] Date of Birth: 01-20-1924  Today's Date: 03/27/2014 Time: 4098-1191 SLP Time Calculation (min): 25 min  Past Medical History:  Past Medical History  Diagnosis Date  . Hypertension   . Hypercholesteremia   . BPH (benign prostatic hyperplasia)   . ILD (interstitial lung disease)   . COPD, moderate   . Stroke     Thalamic   Past Surgical History:  Past Surgical History  Procedure Laterality Date  . Cataract extraction Bilateral   . Appendectomy    . Hernia repair    . Flexible sigmoidoscopy N/A 04/24/2013    Procedure: FLEXIBLE SIGMOIDOSCOPY;  Surgeon: Shirley Friar, MD;  Location: [facility] ENDOSCOPY;  Service: Endoscopy;   Laterality: N/A;   HPI:  80 yo male admitted for SOB, productive cough, and confusion with  LLL airspace disease per CXR. PMH includes COPD, ILD, GERD,  hypertension. Pt had MBS in 2013 with deep, silent penetration to  the vocal cords with large sips of thin liquid. SLP recommended a  regular diet and thin liquids with small, single sips to increase  airway protection.  Pt has been followed for several days with  increasing concerns for aspiration.  Has required a partial  nonrebreather.  Recent CXR showed worsened biltaeral basilar  predominant opacities.  Has been coughing with meals, due in part  to rushing rather than eating more slowly and allowing time for  respiration.  MBS recommended.       Assessment / Plan / Recommendation Clinical Impression  Dysphagia Diagnosis: Mild pharyngeal phase  dysphagia  Clinical impression: Pt's swallow abilities appeared to be quite  functional during MBS.  Demonstrated only occasional penetration  of thin liquids, but it was not observed to reach the level of  the vocal cords.  There was mild pharyngeal residue post-swallow.   No aspiration. During assessment, pt's pace of consumption was  controlled by examiner.  When pt is functionally eating/drinking,   he has been observed to do so at rapid rates, leading to  clinical signs of aspiration.    Recommend regular diet, thin liquids but strict precautions to  minimize potential for aspiration:  SLOW RATE; REPLACE NRB BETWEEN BOLUSES OF FOOD/LIQUID AND ALLOW  5-6 BREATHS BEFORE RESUMING EATING.    Treatment Recommendation    Continue per original plan   Diet Recommendation Regular;Thin liquid   Liquid Administration via: Cup;Straw Medication Administration: Whole meds with puree Supervision: Patient able to self feed;Intermittent supervision  to cue for compensatory strategies Compensations: Slow rate;Small sips/bites (breathe 5-6 times  between swallows) Postural Changes and/or Swallow Maneuvers: Seated upright 90  degrees  Other  Recommendations Oral Care Recommendations: Oral care BID   Follow Up Recommendations   (tba)                SLP Swallow Goals     General Date of Onset: 03/27/14 HPI: 80 yo male admitted for SOB, productive cough, and confusion  with LLL airspace disease per CXR. PMH includes COPD, ILD, GERD,  hypertension. Pt had MBS in 2013 with deep, silent penetration to  the vocal cords with large sips of thin liquid. SLP recommended a  regular diet and thin liquids with small, single sips to increase  airway protection.  Pt has been followed for several days with  increasing concerns for aspiration.  Has required a partial  nonrebreather.  Recent CXR showed worsened biltaeral basilar  predominant opacities.  Has been coughing with meals, due in part  to rushing rather than eating more slowly and  allowing time for  respiration.  MBS recommended.   Type of Study: Modified Barium Swallowing Study Reason for Referral: Objectively evaluate swallowing function Previous Swallow Assessment: see HPI Diet Prior to this Study: Dysphagia 3 (soft);Nectar-thick liquids Temperature Spikes Noted: No Respiratory Status: non-rebreather History of Recent Intubation: No Behavior/Cognition: Alert;Cooperative Oral Cavity - Dentition: Adequate natural dentition Oral Motor / Sensory Function: Within functional limits Self-Feeding Abilities: Able to feed self Patient Positioning: Upright in chair Baseline Vocal Quality: Clear Volitional Cough: Strong Volitional Swallow: Able to elicit Anatomy: Within functional limits Pharyngeal Secretions: Not observed secondary MBS    Reason for Referral Objectively evaluate swallowing function   Oral Phase Oral Preparation/Oral Phase Oral Phase: WFL   Pharyngeal Phase Pharyngeal Phase Pharyngeal Phase: Impaired Pharyngeal - Thin Pharyngeal - Thin Cup: Delayed swallow  initiation;Penetration/Aspiration during swallow;Pharyngeal  residue - valleculae Penetration/Aspiration details (thin cup): Material enters  airway, remains ABOVE vocal cords and not ejected out Pharyngeal - Solids Pharyngeal - Puree: Premature spillage to valleculae;Pharyngeal  residue - valleculae Pharyngeal - Regular: Delayed swallow initiation;Pharyngeal  residue - valleculae  Cervical Esophageal Phase    Amanda L. [name], [place] CCC/SLP Pager [phone]              Blenda MountsCouture, Amanda Laurice 03/27/2014, 2:40 PM     Scheduled Meds: . albuterol  2.5 mg Nebulization Q4H  . amLODipine  10 mg Oral Daily  . antiseptic oral rinse  7 mL Mouth Rinse BID  . aspirin EC  81 mg Oral Daily  . dutasteride  0.5 mg Oral Daily  . enoxaparin (LOVENOX) injection  40 mg Subcutaneous Q24H  . [START ON 03/30/2014] furosemide  60 mg Oral Daily  . guaiFENesin  600 mg Oral BID  . ipratropium-albuterol  3 mL Nebulization TID  . losartan  50 mg  Oral Daily  . pantoprazole  40 mg Oral Daily  . potassium chloride  20 mEq Oral BID  . pravastatin  40 mg Oral Daily  . tamsulosin  0.4 mg Oral Daily   Continuous Infusions:   Principal Problem:   Acute on chronic respiratory failure with hypoxemia Active Problems:   COPD with emphysema Gold C    ILD (interstitial lung disease)   Pulmonary hypertension   Hypertension   Collagenous colitis   Pneumonia   Accidental fall from bed   Acute encephalopathy    Time spent: 40 minutes   [facility] Healthcare SystemBROL,Jasir Rother  [facility] Pager [phone]. If 7PM-7AM, please contact night-coverage at [email or web address], password [email or web address] 03/29/2014, 1:02 PM  LOS: 8 days

## 2014-03-29 NOTE — Progress Notes (Signed)
Medicare Important Message given? YES  (If response is "NO", the following Medicare IM given date fields will be blank)  Date Medicare IM given: 03/29/14 Medicare IM given by:  Michalle Rademaker  

## 2014-03-29 NOTE — Progress Notes (Signed)
RT Note: Patient was placed on a high flow nasal cannula at fio2 of 70% and a flow of 40 @ 1450. Rt was unable to locate a oximizer so patient was placed on high flow nasal cannula. Rt paged MD and spoke with Dr. Dema SeverinMungal with critical care and notified him about the unavailability of an oximizer. He is also fine with using high flow nasal cannula. Patient is currently tolerating it very well with an Spo2-92%. Rt will attempt to wean as patient tolerates.

## 2014-03-29 NOTE — Progress Notes (Signed)
INITIAL NUTRITION ASSESSMENT  DOCUMENTATION CODES Per approved criteria  -Not Applicable   INTERVENTION: -Ensure Complete po BID, each supplement provides 350 kcal and 13 grams of protein - RD will continue to monitor  NUTRITION DIAGNOSIS: Inadequate oral intake related to COPD as evidenced by varied po intake.   Goal: Pt to meet >/= 90% of their estimated nutrition needs   Monitor:  Weight trend, po intake, acceptance of supplements, labs  Reason for Assessment: Malnutrition Screening Tool  80 y.o. male  Admitting Dx: Acute on chronic respiratory failure with hypoxemia  ASSESSMENT: 80 y.o. male with Past medical history of hypertension, COPD with chronic resp failure on chronic o2 3LPM, CVA, interstitial lung disease, BPH, diastolic dysfunction, pulmonary hypertension. The patient is presenting with complaints of shortness of breath ongoing with last 1 week.   - Pt was eating lunch during RD visit. He says that he has had a fair appetite. He has had no recent weight loss that he knows of. Per chart history, weight is stable. Pt is eating 50% of meals at this time.  - Pt drinks Ensure Complete twice each day. - Pt with mild wasting of the temples.   Labs: Na, K WNL BUN elevated  Height: Ht Readings from Last 1 Encounters:  03/22/14 5\' 7"  (1.702 m)    Weight: Wt Readings from Last 1 Encounters:  03/28/14 152 lb 1.9 oz (69 kg)    Ideal Body Weight: 148 lbs  % Ideal Body Weight: 103%  Wt Readings from Last 10 Encounters:  03/28/14 152 lb 1.9 oz (69 kg)  11/28/13 149 lb (67.586 kg)  04/26/13 139 lb 1.8 oz (63.1 kg)  04/26/13 139 lb 1.8 oz (63.1 kg)  12/31/12 145 lb 15.1 oz (66.2 kg)  11/10/12 154 lb (69.854 kg)  07/12/12 160 lb (72.576 kg)  05/08/12 160 lb 12.8 oz (72.938 kg)  05/05/12 161 lb (73.029 kg)  04/20/12 160 lb 6.4 oz (72.757 kg)   BMI:  Body mass index is 23.82 kg/(m^2).  Estimated Nutritional Needs: Kcal: [id number] Protein: 90-100 g Fluid:  1.7-1.9 L/day  Skin: intact  Diet Order: Cardiac  EDUCATION NEEDS: -Education needs addressed   Intake/Output Summary (Last 24 hours) at 03/29/14 1342 Last data filed at 03/29/14 0900  Gross per 24 hour  Intake    480 ml  Output      0 ml  Net    480 ml    Last BM: 10/15   Labs:   Recent Labs Lab 03/25/14 0335 03/26/14 0232 03/27/14 0332 03/29/14 0252  NA 142 142 140 139  K 3.6* 3.5* 4.3 4.2  CL 104 100 102 99  CO2 25 30 28 29   BUN 31* 31* 31* 32*  CREATININE 1.12 0.97 0.92 1.27  CALCIUM 8.3* 8.5 8.3* 8.3*  MG  --  2.3  --   --   PHOS  --  2.7  --   --   GLUCOSE 137* 103* 110* 105*    CBG (last 3)   Recent Labs  03/27/14 1118  GLUCAP 130*    Scheduled Meds: . amLODipine  10 mg Oral Daily  . antiseptic oral rinse  7 mL Mouth Rinse BID  . aspirin EC  81 mg Oral Daily  . dutasteride  0.5 mg Oral Daily  . enoxaparin (LOVENOX) injection  40 mg Subcutaneous Q24H  . [START ON 03/30/2014] furosemide  60 mg Oral Daily  . guaiFENesin  600 mg Oral BID  . ipratropium-albuterol  3 mL  Nebulization TID  . losartan  50 mg Oral Daily  . pantoprazole  40 mg Oral Daily  . potassium chloride  20 mEq Oral BID  . pravastatin  40 mg Oral Daily  . tamsulosin  0.4 mg Oral Daily    Continuous Infusions:   Past Medical History  Diagnosis Date  . Hypertension   . Hypercholesteremia   . BPH (benign prostatic hyperplasia)   . ILD (interstitial lung disease)   . COPD, moderate   . Stroke     Thalamic    Past Surgical History  Procedure Laterality Date  . Cataract extraction Bilateral   . Appendectomy    . Hernia repair    . Flexible sigmoidoscopy N/A 04/24/2013    Procedure: FLEXIBLE SIGMOIDOSCOPY;  Surgeon: Shirley FriarVincent C. Schooler, MD;  [facility];  Service: Endoscopy;  Laterality: N/A;    Emmaline KluverHaley Simone Rodenbeck RD, LDN

## 2014-03-30 DIAGNOSIS — Z66 Do not resuscitate: Secondary | ICD-10-CM

## 2014-03-30 DIAGNOSIS — J432 Centrilobular emphysema: Secondary | ICD-10-CM

## 2014-03-30 NOTE — Progress Notes (Addendum)
[facility] PROGRESS NOTE  Jim Contreras ZOX:[id number] DOB: Sep 08, 1923 DOA: 03/21/2014 PCP: [name], MD  Assessment/Plan: Principal Problem:   Acute on chronic respiratory failure with hypoxemia Active Problems:   COPD with emphysema Gold C    ILD (interstitial lung disease)   Pulmonary hypertension   Hypertension   Collagenous colitis   DNR (do not resuscitate)   Pneumonia   Accidental fall from bed   Acute encephalopathy   Patient with COPD Gold Stage C admitted with dyspnea and acute hypoxemic resp failure   Acute on chronic hypoxemic respiratory failure secondary to left lower lobe pneumonia-not improving  Healthcare associated pneumonia  COPD exacerbation  Interstitial lung disease with pulmonary hypertension  Critical care recommends to maintain SpO2 of > 85%. Obtain oximizer and uptitrate his O2  Discontinued Azithromycin, Azactam, and Vancomycin on 10/11  10/11-10/15 on Imipenem 250 mg IV q6h ,  10/15-Started Rocephin and azithromycin , completing antibiotics . 8 days Completed steroids, continue Lasix to 60 mg a day  Continue chest percussion therapy, out of bed to chair , start patient on incentive spirometry  Speech therapy evaluation recommends regular diet and thin liquids  VQ scan intermediate probability, therefore did CT angiography which was negative for PE  Venous Doppler of the bilateral lower extremities negative for DVT  Try to get on an oximiser Discussed with Dr Delton Coombes  Palliative care consult ordered, hospice eligible Okay to give morphine for dyspnea overnight  Hypokalemia replete , follow BMP q. 48 hours  Chronic stable Diastolic heart failure  Discontinued IV hydration  Increase Lasix to 60 mg a day  2-D echo , showed diastolic heart failure EF of 65-70%, discussed with the patient's daughter  Follow BMP   Hypertension  Continue Norvasc, Lasix   BPH  Continue Avodart and Flomax   GERD  continue Protonix   Acute  encephalopathy  Fall from bed prior to admission-follow mental status  - PT eval recommended SNF  Fall precautions    Code Status: full  Family Communication: family updated about patient's clinical progress  Disposition Plan: Attempting to transfer out to step down and oxygenation is better , eventually will need SNF    Brief narrative:  Jim Contreras is a 80 y.o. male with Past medical history of hypertension, COPD with chronic resp failure on chronic o2 3LPM, CVA, interstitial lung disease, BPH, diastolic dysfunction, pulmonary hypertension.  The patient is presenting with complaints of shortness of breath ongoing with last 1 week progressively worsening along with cough with expectoration. He also had fever and chills. He denies any chest pain. He denies any choking episode. He is from an assisted living facility. No recent travel reported. No leg swelling or leg tenderness. No palpitation dizziness lightheadedness nausea vomiting abdominal pain diarrhea constipation burning urination.  Medication has been notified based on the documentation available from the nursing home and no recent change identified.  Initially the history was reported that the patient had a fall twice on my discussion with the nursing home staff today while the patient was coming out of the bed when his wheelchair was not locked, He fell down on the ground, no head injury or neck injury reported.  Also since last 2 days the patient has not been acting himself as per the nursing home staff.  The patient is coming from SNF. And at his baseline independent for most of his ADL.  Consultants:  None Procedures:  None Antibiotics:  Vancomycin aztreonam/azithromycin discontinued 10/11  imipenem on 10/11-10/15  Rocephin, azithromycin started 10/15    HPI/Subjective:  Very fatigued and tired, states that he slept well last night  Objective: Filed Vitals:   03/29/14 2257 03/30/14 0320 03/30/14 0737 03/30/14 1139   BP: 116/50 105/64 109/58 112/61  Pulse: 72 68 56 53  Temp: 98.5 F (36.9 C) 99.1 F (37.3 C) 98.9 F (37.2 C) 98.6 F (37 C)  TempSrc: Oral Oral Oral Oral  Resp: 17 19 14 14   Height:      Weight:  68.8 kg (151 lb 10.8 oz)    SpO2: 94% 91% 97% 94%   No intake or output data in the 24 hours ending 03/30/14 1204  Exam:  General: Elderly male in NAD  Neuro: Grossly intact  HEENT: Sclera anicteric, conjunctiva pink, MMM, OP clear  Neck: Trachea supple and midline, (-) LAN or JVD  Cardiovascular: RRR, nS1/S2, (-) MRG  Lungs: Distant with mild rales bilaterally at the bases  Abdomen: S/NT/ND/(+)BS  Musculoskeletal: (-) C/C/E  Skin: Grossly Intact          Data Reviewed: Basic Metabolic Panel:  Recent Labs Lab 03/25/14 0335 03/26/14 0232 03/27/14 0332 03/29/14 0252  NA 142 142 140 139  K 3.6* 3.5* 4.3 4.2  CL 104 100 102 99  CO2 25 30 28 29   GLUCOSE 137* 103* 110* 105*  BUN 31* 31* 31* 32*  CREATININE 1.12 0.97 0.92 1.27  CALCIUM 8.3* 8.5 8.3* 8.3*  MG  --  2.3  --   --   PHOS  --  2.7  --   --     Liver Function Tests:  Recent Labs Lab 03/25/14 0335 03/27/14 0332 03/29/14 0252  AST 22 18 14   ALT 16 20 14   ALKPHOS 41 43 49  BILITOT 0.3 0.3 0.4  PROT 6.1 6.2 6.0  ALBUMIN 2.6* 2.6* 2.6*   No results found for this basename: LIPASE, AMYLASE,  in the last 168 hours No results found for this basename: AMMONIA,  in the last 168 hours  CBC:  Recent Labs Lab 03/25/14 0335 03/27/14 0332  WBC 7.8 9.9  HGB 12.0* 13.4  HCT 38.0* 42.0  MCV 79.7 78.1  PLT 194 204    Cardiac Enzymes: No results found for this basename: CKTOTAL, CKMB, CKMBINDEX, TROPONINI,  in the last 168 hours BNP (last 3 results)  Recent Labs  03/24/14 2158  PROBNP 1012.0*     CBG:  Recent Labs Lab 03/27/14 1118  GLUCAP 130*    Recent Results (from the past 240 hour(s))  CULTURE, BLOOD (ROUTINE X 2)     Status: None   Collection Time    03/21/14  5:25 PM       Result Value Ref Range Status   Specimen Description BLOOD RIGHT FOREARM   Final   Special Requests BOTTLES DRAWN AEROBIC AND ANAEROBIC 5CC   Final   Culture  Setup Time     Final   Value: 03/21/2014 23:02     Performed at [facility]   Culture     Final   Value: NO GROWTH 5 DAYS     Performed at [facility]   Report Status 03/27/2014 FINAL   Final  CULTURE, BLOOD (ROUTINE X 2)     Status: None   Collection Time    03/21/14  5:40 PM      Result Value Ref Range Status   Specimen Description BLOOD LEFT FOREARM   Final   Special Requests BOTTLES  DRAWN AEROBIC ONLY 1CC   Final   Culture  Setup Time     Final   Value: 03/21/2014 23:04     Performed at [facility]   Culture     Final   Value: NO GROWTH 5 DAYS     Performed at [facility]   Report Status 03/27/2014 FINAL   Final  MRSA PCR SCREENING     Status: None   Collection Time    03/21/14 10:49 PM      Result Value Ref Range Status   MRSA by PCR NEGATIVE  NEGATIVE Final   Comment:            The GeneXpert MRSA Assay (FDA     approved for NASAL specimens     only), is one component of a     comprehensive MRSA colonization     surveillance program. It is not     intended to diagnose MRSA     infection nor to guide or     monitor treatment for     MRSA infections.  URINE CULTURE     Status: None   Collection Time    03/23/14  9:00 PM      Result Value Ref Range Status   Specimen Description URINE, CLEAN CATCH   Final   Special Requests NONE   Final   Culture  Setup Time     Final   Value: 03/23/2014 21:40     Performed at [facility] Count     Final   Value: NO GROWTH     Performed at [facility]   Culture     Final   Value: NO GROWTH     Performed at [facility]   Report Status 03/25/2014 FINAL   Final     Studies: Ct Head Wo Contrast  03/21/2014   CLINICAL DATA:  Altered mental status after fall.  EXAM: CT HEAD WITHOUT CONTRAST  TECHNIQUE:  Contiguous axial images were obtained from the base of the skull through the vertex without intravenous contrast.  COMPARISON:  CT scan of December 31, 2012.  FINDINGS: Bony calvarium appears intact. Diffuse cortical atrophy is noted. Mild chronic ischemic white matter disease is noted. No mass effect or midline shift is noted. Ventricular size is within normal limits. There is no evidence of mass lesion, hemorrhage or acute infarction.  IMPRESSION: Diffuse cortical atrophy. Mild chronic ischemic white matter disease. No acute intracranial abnormality seen.   Electronically Signed   By: Roque Lias M.D.   On: 03/21/2014 18:26   Ct Angio Chest Pe W/cm &/or Wo Cm  03/26/2014   CLINICAL DATA:  19 -year-old male with acute shortness of breath. Intermediate V/Q scan. Initial encounter.  EXAM: CT ANGIOGRAPHY CHEST WITH CONTRAST  TECHNIQUE: Multidetector CT imaging of the chest was performed using the standard protocol during bolus administration of intravenous contrast. Multiplanar CT image reconstructions and MIPs were obtained to evaluate the vascular anatomy.  CONTRAST:  62mL OMNIPAQUE IOHEXOL 350 MG/ML SOLN  COMPARISON:  High-resolution Chest CT 05/01/2013. Portable chest x-ray 03/24/2014. V/Q scan 03/25/2014.  FINDINGS: Good contrast bolus timing in the pulmonary arterial tree. Respiratory motion artifact at both lung bases. No focal filling defect identified in the pulmonary arterial tree to suggest the presence of acute pulmonary embolism.  Chronic emphysema. Mildly lower lung volumes compared to 2014. Central airways are patent except for a atelectatic changes. Small bilateral layering pleural effusions. Combination  of compressive lower lobe atelectasis and also some patchy right lower lobe pulmonary opacity. In the peripheral right lower lobe there is hyperdense opacity (series 4, image 73 which might be previously aspirated oral contrast, uncertain. However, this was also present to a degree in 2014  and at that time alveolar or microlithiasis was considered. Chronic peripheral increased reticular opacity in the mid lungs is stable.  No pericardial effusion. Calcified coronary artery and aortic atherosclerosis. Stable small right peritracheal and mediastinal lymph nodes. No axillary lymphadenopathy. Negative thoracic inlet.  Negative visualized liver, spleen, pancreas, adrenal glands, kidneys. New moderate-sized gastric hiatal hernia.  Chronic left costovertebral angle rib fractures (e.g. Left seventh rib series 6, image 38). Osteopenia. Mild T12 compression fracture appears nonacute but increased since 2014. No acute osseous abnormality identified.  Review of the MIP images confirms the above findings.  IMPRESSION: 1.  No evidence of acute pulmonary embolus. 2. Chronic emphysema and interstitial lung disease with superimposed small pleural effusions and acute right lower lobe opacity suspicious for pneumonia. 3. Superimposed chronic and progressed right lower lobe hyperdense material with a diagnosis of alveolar microlithiasis considered on the prior study of 2014. 4. Chronic but progressed T12 compression fracture.   Electronically Signed   By: Augusto Gamble M.D.   On: 03/26/2014 13:28   Nm Pulmonary Perf And Vent  03/25/2014   CLINICAL DATA:  Acute on chronic respiratory failure. Hypoxemia. COPD. Emphysema. Encephalopathy.  EXAM: NUCLEAR MEDICINE VENTILATION - PERFUSION LUNG SCAN  TECHNIQUE: Ventilation images were obtained in multiple projections using inhaled aerosol technetium 99 M DTPA. Perfusion images were obtained in multiple projections after intravenous injection of Tc-56m MAA.  RADIOPHARMACEUTICALS:  6.0 mCi Tc-55m DTPA aerosol and 40 mCi Tc-22m MAA  COMPARISON:  03/24/2014  FINDINGS: Ventilation: Abnormal accumulation of radiotracer medially near the hilum in both lungs. Hypoventilation at the right lung base and in the lingula. Hypoventilation in the lung apices.  Perfusion: The patient was  apparently unable to raise the right arm, leading to a photopenic defect on the lateral projection. Patchy moderate-sized perfusion defect posteriorly in the right lung corresponding to radiographic and ventilation abnormalities. Patchy hypoperfusion in both upper lobes.  IMPRESSION: 1. Intermediate probability of pulmonary embolus (risk 20-79%) due to triple matched large segmental defects in the lower lung zone and multiple moderate to large matched the defects in the upper lungs. There is known airspace opacity in the lung bases as well as severe emphysema especially affecting the upper lobes.   Electronically Signed   By: Herbie Baltimore M.D.   On: 03/25/2014 13:22   Dg Chest Port 1 View  03/24/2014   CLINICAL DATA:  Shortness of Breath.  COPD.  Smoker.  A a seen  EXAM: PORTABLE CHEST - 1 VIEW  COMPARISON:  03/21/2014  FINDINGS: Continued left lower lobe airspace disease is similar prior study. In addition, there is increasing right basilar airspace opacity. Findings concerning for multifocal pneumonia.  Underlying COPD/hyperinflation. Heart is borderline in size. No effusions or acute bony abnormality.  IMPRESSION: Stable left lower lobe airspace disease with worsening right basilar airspace opacity. Findings concerning for worsening multifocal pneumonia.   Electronically Signed   By: Charlett Nose M.D.   On: 03/24/2014 15:17   Dg Chest Port 1 View  03/21/2014   CLINICAL DATA:  Confusion fever.  Hypertension.  COPD.  Cough.  EXAM: PORTABLE CHEST - 1 VIEW  COMPARISON:  12/31/2012.  03/09/2012  FINDINGS: Midline trachea. Mild cardiomegaly with atherosclerosis in the transverse  aorta. No right and no definite left pleural effusion. Lower lobe predominant interstitial thickening is moderate. Right upper lobe scarring. More confluent left lower lobe patchy airspace disease.  IMPRESSION: Left lower lobe airspace disease, suspicious for infection or aspiration. Progression of interstitial lung disease is felt  less likely.  Interstitial lung disease as was detailed on 05/01/2013 high-resolution CT. Considerations include alveolar microlithiasis superimposed upon nonspecific interstitial pneumonitis.  Cardiomegaly without congestive failure.   Electronically Signed   By: Jeronimo Greaves M.D.   On: 03/21/2014 17:58   Dg Swallowing Func-speech Pathology  03/27/2014   Carolan Shiver, CCC-SLP     03/27/2014  2:45 PM Objective Swallowing Evaluation: Modified Barium Swallowing Study   Patient Details  Name: OZIAS DICENZO MRN: [id number] Date of Birth: [birth year]  Today's Date: 03/27/2014 Time: 4098-1191 SLP Time Calculation (min): 25 min  Past Medical History:  Past Medical History  Diagnosis Date  . Hypertension   . Hypercholesteremia   . BPH (benign prostatic hyperplasia)   . ILD (interstitial lung disease)   . COPD, moderate   . Stroke     Thalamic   Past Surgical History:  Past Surgical History  Procedure Laterality Date  . Cataract extraction Bilateral   . Appendectomy    . Hernia repair    . Flexible sigmoidoscopy N/A 04/24/2013    Procedure: FLEXIBLE SIGMOIDOSCOPY;  Surgeon: Shirley Friar, MD;  Location: [facility];  Service: Endoscopy;   Laterality: N/A;   HPI:  80 yo male admitted for SOB, productive cough, and confusion with  LLL airspace disease per CXR. PMH includes COPD, ILD, GERD,  hypertension. Pt had MBS in 2013 with deep, silent penetration to  the vocal cords with large sips of thin liquid. SLP recommended a  regular diet and thin liquids with small, single sips to increase  airway protection.  Pt has been followed for several days with  increasing concerns for aspiration.  Has required a partial  nonrebreather.  Recent CXR showed worsened biltaeral basilar  predominant opacities.  Has been coughing with meals, due in part  to rushing rather than eating more slowly and allowing time for  respiration.  MBS recommended.       Assessment / Plan / Recommendation Clinical Impression  Dysphagia  Diagnosis: Mild pharyngeal phase dysphagia  Clinical impression: Pt's swallow abilities appeared to be quite  functional during MBS.  Demonstrated only occasional penetration  of thin liquids, but it was not observed to reach the level of  the vocal cords.  There was mild pharyngeal residue post-swallow.   No aspiration. During assessment, pt's pace of consumption was  controlled by examiner.  When pt is functionally eating/drinking,   he has been observed to do so at rapid rates, leading to  clinical signs of aspiration.    Recommend regular diet, thin liquids but strict precautions to  minimize potential for aspiration:  SLOW RATE; REPLACE NRB BETWEEN BOLUSES OF FOOD/LIQUID AND ALLOW  5-6 BREATHS BEFORE RESUMING EATING.    Treatment Recommendation    Continue per original plan   Diet Recommendation Regular;Thin liquid   Liquid Administration via: Cup;Straw Medication Administration: Whole meds with puree Supervision: Patient able to self feed;Intermittent supervision  to cue for compensatory strategies Compensations: Slow rate;Small sips/bites (breathe 5-6 times  between swallows) Postural Changes and/or Swallow Maneuvers: Seated upright 90  degrees    Other  Recommendations Oral Care Recommendations: Oral care BID   Follow Up Recommendations   (tba)  SLP Swallow Goals     General Date of Onset: 03/27/14 HPI: 80 yo male admitted for SOB, productive cough, and confusion  with LLL airspace disease per CXR. PMH includes COPD, ILD, GERD,  hypertension. Pt had MBS in 2013 with deep, silent penetration to  the vocal cords with large sips of thin liquid. SLP recommended a  regular diet and thin liquids with small, single sips to increase  airway protection.  Pt has been followed for several days with  increasing concerns for aspiration.  Has required a partial  nonrebreather.  Recent CXR showed worsened biltaeral basilar  predominant opacities.  Has been coughing with meals, due in part  to rushing rather  than eating more slowly and allowing time for  respiration.  MBS recommended.   Type of Study: Modified Barium Swallowing Study Reason for Referral: Objectively evaluate swallowing function Previous Swallow Assessment: see HPI Diet Prior to this Study: Dysphagia 3 (soft);Nectar-thick liquids Temperature Spikes Noted: No Respiratory Status: non-rebreather History of Recent Intubation: No Behavior/Cognition: Alert;Cooperative Oral Cavity - Dentition: Adequate natural dentition Oral Motor / Sensory Function: Within functional limits Self-Feeding Abilities: Able to feed self Patient Positioning: Upright in chair Baseline Vocal Quality: Clear Volitional Cough: Strong Volitional Swallow: Able to elicit Anatomy: Within functional limits Pharyngeal Secretions: Not observed secondary MBS    Reason for Referral Objectively evaluate swallowing function   Oral Phase Oral Preparation/Oral Phase Oral Phase: WFL   Pharyngeal Phase Pharyngeal Phase Pharyngeal Phase: Impaired Pharyngeal - Thin Pharyngeal - Thin Cup: Delayed swallow  initiation;Penetration/Aspiration during swallow;Pharyngeal  residue - valleculae Penetration/Aspiration details (thin cup): Material enters  airway, remains ABOVE vocal cords and not ejected out Pharyngeal - Solids Pharyngeal - Puree: Premature spillage to valleculae;Pharyngeal  residue - valleculae Pharyngeal - Regular: Delayed swallow initiation;Pharyngeal  residue - valleculae  Cervical Esophageal Phase    Amanda L. [name], [place] CCC/SLP Pager [phone]              Blenda MountsCouture, Amanda Laurice 03/27/2014, 2:40 PM     Scheduled Meds: . amLODipine  10 mg Oral Daily  . antiseptic oral rinse  7 mL Mouth Rinse BID  . aspirin EC  81 mg Oral Daily  . dutasteride  0.5 mg Oral Daily  . enoxaparin (LOVENOX) injection  40 mg Subcutaneous Q24H  . feeding supplement (ENSURE COMPLETE)  237 mL Oral BID BM  . furosemide  60 mg Oral Daily  . guaiFENesin  600 mg Oral BID  . ipratropium-albuterol  3 mL  Nebulization TID  . losartan  50 mg Oral Daily  . pantoprazole  40 mg Oral Daily  . potassium chloride  20 mEq Oral BID  . pravastatin  40 mg Oral Daily  . tamsulosin  0.4 mg Oral Daily   Continuous Infusions:   Principal Problem:   Acute on chronic respiratory failure with hypoxemia Active Problems:   COPD with emphysema Gold C    ILD (interstitial lung disease)   Pulmonary hypertension   Hypertension   Collagenous colitis   DNR (do not resuscitate)   Pneumonia   Accidental fall from bed   Acute encephalopathy    Time spent: 40 minutes   [facility],Skarlet Lyons  [facility] Pager [phone]. If 7PM-7AM, please contact night-coverage at [email or web address], password [email or web address] 03/30/2014, 12:04 PM  LOS: 9 days

## 2014-03-30 NOTE — Progress Notes (Signed)
PULMONARY  / CRITICAL CARE MEDICINE CONSULTATION   Name: Jim Contreras MRN: [id number] DOB: Dec 27, 1923    ADMISSION DATE:  03/21/2014 CONSULTATION DATE: 03/24/2014  REQUESTING CLINICIAN: Dr. Susie CassetteAbrol PRIMARY SERVICE: TRH  CHIEF COMPLAINT:  SOB  BRIEF PATIENT DESCRIPTION: [age] M with COPD/ILD on chronic O2, patient of PW, who presented 10/8 with SOB likely 2/2 COPD exacerbation.  SIGNIFICANT EVENTS / STUDIES:  10/08 ABG > 7.491/28.6/51.0 10/11 Chest X-ray > with worsening bibasilar opacities concerning for pneumonia 10/15 On SDU, requiring increased O2, Partial NRB  LINES / TUBES: PIV  CULTURES:  Blood Cultures 10/8 >> Negative Urine Culture 10/8 >> Negative Flu Panel 10/8 >> Negative   SUBJECTIVE:  Remains on HFC 65%.   VITAL SIGNS: Temp:  [98.2 F (36.8 C)-99.1 F (37.3 C)] 98.9 F (37.2 C) (10/17 0737) Pulse Rate:  [56-77] 56 (10/17 0737) Resp:  [14-20] 14 (10/17 0737) BP: (100-130)/(48-64) 109/58 mmHg (10/17 0737) SpO2:  [90 %-97 %] 97 % (10/17 0737) FiO2 (%):  [65 %-70 %] 65 % (10/17 0933) Weight:  [68.8 kg (151 lb 10.8 oz)] 68.8 kg (151 lb 10.8 oz) (10/17 0320)     VENTILATOR SETTINGS: Vent Mode:  [-]  FiO2 (%):  [65 %-70 %] 65 %  INTAKE / OUTPUT: Intake/Output     10/16 0701 - 10/17 0700 10/17 0701 - 10/18 0700   P.O. 240    Total Intake(mL/kg) 240 (3.5)    Urine (mL/kg/hr)     Total Output       Net +240          Urine Occurrence 1 x      PHYSICAL EXAMINATION: General:  Elderly male in NAD Neuro:  Grossly intact HEENT:  Sclera anicteric, conjunctiva pink, MMM, OP clear Neck: Trachea supple and midline, (-) LAN or JVD Cardiovascular:  RRR, nS1/S2, (-) MRG Lungs:  Distant with mild rales bilaterally at the bases Abdomen:  S/NT/ND/(+)BS Musculoskeletal:  (-) C/C/E Skin:  Grossly Intact  LABS:  CBC  Recent Labs Lab 03/25/14 0335 03/27/14 0332  WBC 7.8 9.9  HGB 12.0* 13.4  HCT 38.0* 42.0  PLT 194 204   BMET  Recent Labs Lab  03/26/14 0232 03/27/14 0332 03/29/14 0252  NA 142 140 139  K 3.5* 4.3 4.2  CL 100 102 99  CO2 30 28 29   BUN 31* 31* 32*  CREATININE 0.97 0.92 1.27  GLUCOSE 103* 110* 105*   Electrolytes  Recent Labs Lab 03/26/14 0232 03/27/14 0332 03/29/14 0252  CALCIUM 8.5 8.3* 8.3*  MG 2.3  --   --   PHOS 2.7  --   --    Sepsis Markers No results found for this basename: LATICACIDVEN, PROCALCITON, O2SATVEN,  in the last 168 hours ABG No results found for this basename: PHART, PCO2ART, PO2ART,  in the last 168 hours Liver Enzymes  Recent Labs Lab 03/25/14 0335 03/27/14 0332 03/29/14 0252  AST 22 18 14   ALT 16 20 14   ALKPHOS 41 43 49  BILITOT 0.3 0.3 0.4  ALBUMIN 2.6* 2.6* 2.6*   Cardiac Enzymes  Recent Labs Lab 03/24/14 2158  PROBNP 1012.0*   Glucose  Recent Labs Lab 03/27/14 1118  GLUCAP 130*    Imaging No results found.  CXR: no film 10/17  ASSESSMENT / PLAN:  Acute on Chronic Hypoxic Respiratory Failure: With elevated WBC count on presentation and progressive bibasilar infiltrates.. Pneumonia can also explain the worsened falls noted recently. No active wheeze. Consider flare ILD, cardiac or non-cardiac  edema.  ILD with possible flare, ? A contribution of chronic aspiration  Severe COPD / emphysema  Plan: -prednisone course completed -Continue Duonebs and PRN albuterol -Dose lasix daily as renal fxn / BP permit.  -wean O2 for sats > 90% -mobilize as able, appreciate PT -SLP > no overt aspiration but at risk when he eats quickly; aspiration precautions instituted -I would accept SpO2 of > 85%. Will ask for an oximizer and uptitrate his O2 -rediscussed Eol issues with the pt, will call the family as well.  Try to get on an oximiser.   -?home with hospice   I have personally obtained a history, examined the patient, evaluated laboratory and imaging results, formulated the assessment and plan and placed orders.  Dorcas Carrowatrick WrightMD Beeper  [phone]   Cell  [phone]  If no response or cell goes to voicemail, call beeper [phone]  03/30/2014, 10:42 AM

## 2014-03-30 NOTE — Progress Notes (Signed)
eLink Physician-Brief Progress Note Patient Name: Georgina QuintJames R Kantz DOB: 1924/05/16 MRN: [id number]   Date of Service  03/30/2014  HPI/Events of Note  Contacted by nurse for drop in spo2 on hi flow  eICU Interventions  Pt on 50L/min 50% . Sats improved to 90% at time of call. Nurse will call respiratory to review heated hi flow.     Intervention Category Intermediate Interventions: Respiratory distress - evaluation and management  Janmichael Giraud K. 03/30/2014, 3:27 PM

## 2014-03-31 DIAGNOSIS — Z515 Encounter for palliative care: Secondary | ICD-10-CM

## 2014-03-31 LAB — BASIC METABOLIC PANEL
ANION GAP: 11 (ref 5–15)
BUN: 35 mg/dL — AB (ref 6–23)
CHLORIDE: 98 meq/L (ref 96–112)
CO2: 28 mEq/L (ref 19–32)
CREATININE: 1.35 mg/dL (ref 0.50–1.35)
Calcium: 8.8 mg/dL (ref 8.4–10.5)
GFR calc Af Amer: 52 mL/min — ABNORMAL LOW (ref >90)
GFR calc non Af Amer: 45 mL/min — ABNORMAL LOW (ref >90)
GLUCOSE: 109 mg/dL — AB (ref 70–99)
Potassium: 4.7 mEq/L (ref 3.7–5.3)
Sodium: 137 mEq/L (ref 137–147)

## 2014-03-31 NOTE — Progress Notes (Signed)
Called report to [facility]

## 2014-03-31 NOTE — Progress Notes (Signed)
Patient having desating episode, looks like patient is just breathing through the mouth and very relaxed, increased O2 to help compensate through sleep and will decrease in the AM. Desat's were to the low 70's

## 2014-03-31 NOTE — Progress Notes (Addendum)
[facility] PROGRESS NOTE  JVEON POUND ZOX:[id number] DOB: [birth year] DOA: 03/21/2014 PCP: [name], MD  Assessment/Plan: Principal Problem:   Acute on chronic respiratory failure with hypoxemia Active Problems:   COPD with emphysema Gold C    ILD (interstitial lung disease)   Pulmonary hypertension   Hypertension   Collagenous colitis   DNR (do not resuscitate)   Pneumonia   Accidental fall from bed   Acute encephalopathy   Palliative care patient    Patient with COPD Gold Stage C admitted with dyspnea and acute hypoxemic resp failure  Acute on chronic hypoxemic respiratory failure secondary to left lower lobe pneumonia-not improving  Healthcare associated pneumonia  COPD exacerbation  Interstitial lung disease with pulmonary hypertension  Critical care recommends to maintain SpO2 of > 85%. Obtain oximizer and uptitrate his O2  Discontinued Azithromycin, Azactam, and Vancomycin on 10/11  10/11-10/15 on Imipenem 250 mg IV q6h ,  10/15-Started Rocephin and azithromycin , completing antibiotics . 8 days  Completed steroids, continue Lasix to 60 mg a day  Continue chest percussion therapy, out of bed to chair , start patient on incentive spirometry  Speech therapy evaluation recommends regular diet and thin liquids  VQ scan intermediate probability, therefore did CT angiography which was negative for PE  Venous Doppler of the bilateral lower extremities negative for DVT  get pt oximiser, titrate HFO for sats 82-85%  Acceptable SpO2 of > 82 %.  We can get pt to [facility] place on 8-10L flow or home with two 10L concentrators y together  Palliative care consult ordered, hospice eligible  Okay to give morphine for dyspnea overnight  Probable discharge tomorrow to home with hospice  Hypokalemia replete , follow BMP q. 48 hours    Chronic stable Diastolic heart failure  Continue Lasix to 60 mg a day  2-D echo , showed diastolic heart failure EF of 65-70%,  discussed with the patient's daughter  Follow BMP   Hypertension  Continue Norvasc, Lasix   BPH  Continue Avodart and Flomax   GERD  continue Protonix   Acute encephalopathy  Fall from bed prior to admission-follow mental status  - PT eval recommended SNF  Fall precautions    Code Status: full  Family Communication: family updated about patient's clinical progress  Disposition Plan: Probably hospice eligible, will make arrangements    Brief narrative:  Jim Contreras is a 80 y.o. male with Past medical history of hypertension, COPD with chronic resp failure on chronic o2 3LPM, CVA, interstitial lung disease, BPH, diastolic dysfunction, pulmonary hypertension.  The patient is presenting with complaints of shortness of breath ongoing with last 1 week progressively worsening along with cough with expectoration. He also had fever and chills. He denies any chest pain. He denies any choking episode. He is from an assisted living facility. No recent travel reported. No leg swelling or leg tenderness. No palpitation dizziness lightheadedness nausea vomiting abdominal pain diarrhea constipation burning urination.  Medication has been notified based on the documentation available from the nursing home and no recent change identified.  Initially the history was reported that the patient had a fall twice on my discussion with the nursing home staff today while the patient was coming out of the bed when his wheelchair was not locked, He fell down on the ground, no head injury or neck injury reported.  Also since last 2 days the patient has not been acting himself as per the nursing home staff.  The patient is  coming from SNF. And at his baseline independent for most of his ADL.  Consultants:  None Procedures:  None Antibiotics:  Vancomycin aztreonam/azithromycin discontinued 10/11  imipenem on 10/11-10/15  Rocephin, azithromycin started 10/15    HPI/Subjective: Sleeping,  arousable  Objective: Filed Vitals:   03/31/14 0120 03/31/14 0314 03/31/14 0754 03/31/14 1011  BP:  109/55 106/44   Pulse: 55 59 54 57  Temp:  98.2 F (36.8 C) 98.6 F (37 C)   TempSrc:  Oral Oral   Resp: 17 16 17 19   Height:      Weight:  70 kg (154 lb 5.2 oz)    SpO2: 92% 99% 95%    No intake or output data in the 24 hours ending 03/31/14 1024  Exam:  General: alert & oriented x 3 In NAD  Cardiovascular: RRR, nl S1 s2  Respiratory: Decreased breath sounds at the bases, scattered rhonchi, no crackles  Abdomen: soft +BS NT/ND, no masses palpable  Extremities: No cyanosis and no edema      Data Reviewed: Basic Metabolic Panel:  Recent Labs Lab 03/25/14 0335 03/26/14 0232 03/27/14 0332 03/29/14 0252 03/31/14 0329  NA 142 142 140 139 137  K 3.6* 3.5* 4.3 4.2 4.7  CL 104 100 102 99 98  CO2 25 30 28 29 28   GLUCOSE 137* 103* 110* 105* 109*  BUN 31* 31* 31* 32* 35*  CREATININE 1.12 0.97 0.92 1.27 1.35  CALCIUM 8.3* 8.5 8.3* 8.3* 8.8  MG  --  2.3  --   --   --   PHOS  --  2.7  --   --   --     Liver Function Tests:  Recent Labs Lab 03/25/14 0335 03/27/14 0332 03/29/14 0252  AST 22 18 14   ALT 16 20 14   ALKPHOS 41 43 49  BILITOT 0.3 0.3 0.4  PROT 6.1 6.2 6.0  ALBUMIN 2.6* 2.6* 2.6*   No results found for this basename: LIPASE, AMYLASE,  in the last 168 hours No results found for this basename: AMMONIA,  in the last 168 hours  CBC:  Recent Labs Lab 03/25/14 0335 03/27/14 0332  WBC 7.8 9.9  HGB 12.0* 13.4  HCT 38.0* 42.0  MCV 79.7 78.1  PLT 194 204    Cardiac Enzymes: No results found for this basename: CKTOTAL, CKMB, CKMBINDEX, TROPONINI,  in the last 168 hours BNP (last 3 results)  Recent Labs  03/24/14 2158  PROBNP 1012.0*     CBG:  Recent Labs Lab 03/27/14 1118  GLUCAP 130*    Recent Results (from the past 240 hour(s))  CULTURE, BLOOD (ROUTINE X 2)     Status: None   Collection Time    03/21/14  5:25 PM      Result Value  Ref Range Status   Specimen Description BLOOD RIGHT FOREARM   Final   Special Requests BOTTLES DRAWN AEROBIC AND ANAEROBIC 5CC   Final   Culture  Setup Time     Final   Value: 03/21/2014 23:02     Performed at [facility]   Culture     Final   Value: NO GROWTH 5 DAYS     Performed at [facility]   Report Status 03/27/2014 FINAL   Final  CULTURE, BLOOD (ROUTINE X 2)     Status: None   Collection Time    03/21/14  5:40 PM      Result Value Ref Range Status   Specimen Description BLOOD LEFT  FOREARM   Final   Special Requests BOTTLES DRAWN AEROBIC ONLY 1CC   Final   Culture  Setup Time     Final   Value: 03/21/2014 23:04     Performed at [facility]   Culture     Final   Value: NO GROWTH 5 DAYS     Performed at [facility]   Report Status 03/27/2014 FINAL   Final  MRSA PCR SCREENING     Status: None   Collection Time    03/21/14 10:49 PM      Result Value Ref Range Status   MRSA by PCR NEGATIVE  NEGATIVE Final   Comment:            The GeneXpert MRSA Assay (FDA     approved for NASAL specimens     only), is one component of a     comprehensive MRSA colonization     surveillance program. It is not     intended to diagnose MRSA     infection nor to guide or     monitor treatment for     MRSA infections.  URINE CULTURE     Status: None   Collection Time    03/23/14  9:00 PM      Result Value Ref Range Status   Specimen Description URINE, CLEAN CATCH   Final   Special Requests NONE   Final   Culture  Setup Time     Final   Value: 03/23/2014 21:40     Performed at [facility] Count     Final   Value: NO GROWTH     Performed at [facility]   Culture     Final   Value: NO GROWTH     Performed at [facility]   Report Status 03/25/2014 FINAL   Final     Studies: Ct Head Wo Contrast  03/21/2014   CLINICAL DATA:  Altered mental status after fall.  EXAM: CT HEAD WITHOUT CONTRAST  TECHNIQUE: Contiguous  axial images were obtained from the base of the skull through the vertex without intravenous contrast.  COMPARISON:  CT scan of December 31, 2012.  FINDINGS: Bony calvarium appears intact. Diffuse cortical atrophy is noted. Mild chronic ischemic white matter disease is noted. No mass effect or midline shift is noted. Ventricular size is within normal limits. There is no evidence of mass lesion, hemorrhage or acute infarction.  IMPRESSION: Diffuse cortical atrophy. Mild chronic ischemic white matter disease. No acute intracranial abnormality seen.   Electronically Signed   By: Roque Lias M.D.   On: 03/21/2014 18:26   Ct Angio Chest Pe W/cm &/or Wo Cm  03/26/2014   CLINICAL DATA:  15 -year-old male with acute shortness of breath. Intermediate V/Q scan. Initial encounter.  EXAM: CT ANGIOGRAPHY CHEST WITH CONTRAST  TECHNIQUE: Multidetector CT imaging of the chest was performed using the standard protocol during bolus administration of intravenous contrast. Multiplanar CT image reconstructions and MIPs were obtained to evaluate the vascular anatomy.  CONTRAST:  62mL OMNIPAQUE IOHEXOL 350 MG/ML SOLN  COMPARISON:  High-resolution Chest CT 05/01/2013. Portable chest x-ray 03/24/2014. V/Q scan 03/25/2014.  FINDINGS: Good contrast bolus timing in the pulmonary arterial tree. Respiratory motion artifact at both lung bases. No focal filling defect identified in the pulmonary arterial tree to suggest the presence of acute pulmonary embolism.  Chronic emphysema. Mildly lower lung volumes compared to 2014. Central airways are patent except for  a atelectatic changes. Small bilateral layering pleural effusions. Combination of compressive lower lobe atelectasis and also some patchy right lower lobe pulmonary opacity. In the peripheral right lower lobe there is hyperdense opacity (series 4, image 73 which might be previously aspirated oral contrast, uncertain. However, this was also present to a degree in 2014 and at that  time alveolar or microlithiasis was considered. Chronic peripheral increased reticular opacity in the mid lungs is stable.  No pericardial effusion. Calcified coronary artery and aortic atherosclerosis. Stable small right peritracheal and mediastinal lymph nodes. No axillary lymphadenopathy. Negative thoracic inlet.  Negative visualized liver, spleen, pancreas, adrenal glands, kidneys. New moderate-sized gastric hiatal hernia.  Chronic left costovertebral angle rib fractures (e.g. Left seventh rib series 6, image 38). Osteopenia. Mild T12 compression fracture appears nonacute but increased since 2014. No acute osseous abnormality identified.  Review of the MIP images confirms the above findings.  IMPRESSION: 1.  No evidence of acute pulmonary embolus. 2. Chronic emphysema and interstitial lung disease with superimposed small pleural effusions and acute right lower lobe opacity suspicious for pneumonia. 3. Superimposed chronic and progressed right lower lobe hyperdense material with a diagnosis of alveolar microlithiasis considered on the prior study of 2014. 4. Chronic but progressed T12 compression fracture.   Electronically Signed   By: Augusto GambleLee  Hall M.D.   On: 03/26/2014 13:28   Nm Pulmonary Perf And Vent  03/25/2014   CLINICAL DATA:  Acute on chronic respiratory failure. Hypoxemia. COPD. Emphysema. Encephalopathy.  EXAM: NUCLEAR MEDICINE VENTILATION - PERFUSION LUNG SCAN  TECHNIQUE: Ventilation images were obtained in multiple projections using inhaled aerosol technetium 99 M DTPA. Perfusion images were obtained in multiple projections after intravenous injection of Tc-2157m MAA.  RADIOPHARMACEUTICALS:  6.0 mCi Tc-6257m DTPA aerosol and 40 mCi Tc-6757m MAA  COMPARISON:  03/24/2014  FINDINGS: Ventilation: Abnormal accumulation of radiotracer medially near the hilum in both lungs. Hypoventilation at the right lung base and in the lingula. Hypoventilation in the lung apices.  Perfusion: The patient was apparently unable  to raise the right arm, leading to a photopenic defect on the lateral projection. Patchy moderate-sized perfusion defect posteriorly in the right lung corresponding to radiographic and ventilation abnormalities. Patchy hypoperfusion in both upper lobes.  IMPRESSION: 1. Intermediate probability of pulmonary embolus (risk 20-79%) due to triple matched large segmental defects in the lower lung zone and multiple moderate to large matched the defects in the upper lungs. There is known airspace opacity in the lung bases as well as severe emphysema especially affecting the upper lobes.   Electronically Signed   By: Herbie BaltimoreWalt  Liebkemann M.D.   On: 03/25/2014 13:22   Dg Chest Port 1 View  03/24/2014   CLINICAL DATA:  Shortness of Breath.  COPD.  Smoker.  A a seen  EXAM: PORTABLE CHEST - 1 VIEW  COMPARISON:  03/21/2014  FINDINGS: Continued left lower lobe airspace disease is similar prior study. In addition, there is increasing right basilar airspace opacity. Findings concerning for multifocal pneumonia.  Underlying COPD/hyperinflation. Heart is borderline in size. No effusions or acute bony abnormality.  IMPRESSION: Stable left lower lobe airspace disease with worsening right basilar airspace opacity. Findings concerning for worsening multifocal pneumonia.   Electronically Signed   By: Charlett NoseKevin  Dover M.D.   On: 03/24/2014 15:17   Dg Chest Port 1 View  03/21/2014   CLINICAL DATA:  Confusion fever.  Hypertension.  COPD.  Cough.  EXAM: PORTABLE CHEST - 1 VIEW  COMPARISON:  12/31/2012.  03/09/2012  FINDINGS:  Midline trachea. Mild cardiomegaly with atherosclerosis in the transverse aorta. No right and no definite left pleural effusion. Lower lobe predominant interstitial thickening is moderate. Right upper lobe scarring. More confluent left lower lobe patchy airspace disease.  IMPRESSION: Left lower lobe airspace disease, suspicious for infection or aspiration. Progression of interstitial lung disease is felt less likely.   Interstitial lung disease as was detailed on 05/01/2013 high-resolution CT. Considerations include alveolar microlithiasis superimposed upon nonspecific interstitial pneumonitis.  Cardiomegaly without congestive failure.   Electronically Signed   By: Jeronimo GreavesKyle  Talbot M.D.   On: 03/21/2014 17:58   Dg Swallowing Func-speech Pathology  03/27/2014   Carolan ShiverAmanda Laurice Couture, CCC-SLP     03/27/2014  2:45 PM Objective Swallowing Evaluation: Modified Barium Swallowing Study   Patient Details  Name: Georgina QuintJames R Stegman MRN: [id number] Date of Birth: 05-12-1924  Today's Date: 03/27/2014 Time: [id number] SLP Time Calculation (min): 25 min  Past Medical History:  Past Medical History  Diagnosis Date  . Hypertension   . Hypercholesteremia   . BPH (benign prostatic hyperplasia)   . ILD (interstitial lung disease)   . COPD, moderate   . Stroke     Thalamic   Past Surgical History:  Past Surgical History  Procedure Laterality Date  . Cataract extraction Bilateral   . Appendectomy    . Hernia repair    . Flexible sigmoidoscopy N/A 04/24/2013    Procedure: FLEXIBLE SIGMOIDOSCOPY;  Surgeon: Shirley FriarVincent C.  Schooler, MD;  [facility];  Service: Endoscopy;   Laterality: N/A;   HPI:  80 yo male admitted for SOB, productive cough, and confusion with  LLL airspace disease per CXR. PMH includes COPD, ILD, GERD,  hypertension. Pt had MBS in 2013 with deep, silent penetration to  the vocal cords with large sips of thin liquid. SLP recommended a  regular diet and thin liquids with small, single sips to increase  airway protection.  Pt has been followed for several days with  increasing concerns for aspiration.  Has required a partial  nonrebreather.  Recent CXR showed worsened biltaeral basilar  predominant opacities.  Has been coughing with meals, due in part  to rushing rather than eating more slowly and allowing time for  respiration.  MBS recommended.       Assessment / Plan / Recommendation Clinical Impression  Dysphagia Diagnosis: Mild  pharyngeal phase dysphagia  Clinical impression: Pt's swallow abilities appeared to be quite  functional during MBS.  Demonstrated only occasional penetration  of thin liquids, but it was not observed to reach the level of  the vocal cords.  There was mild pharyngeal residue post-swallow.   No aspiration. During assessment, pt's pace of consumption was  controlled by examiner.  When pt is functionally eating/drinking,   he has been observed to do so at rapid rates, leading to  clinical signs of aspiration.    Recommend regular diet, thin liquids but strict precautions to  minimize potential for aspiration:  SLOW RATE; REPLACE NRB BETWEEN BOLUSES OF FOOD/LIQUID AND ALLOW  5-6 BREATHS BEFORE RESUMING EATING.    Treatment Recommendation    Continue per original plan   Diet Recommendation Regular;Thin liquid   Liquid Administration via: Cup;Straw Medication Administration: Whole meds with puree Supervision: Patient able to self feed;Intermittent supervision  to cue for compensatory strategies Compensations: Slow rate;Small sips/bites (breathe 5-6 times  between swallows) Postural Changes and/or Swallow Maneuvers: Seated upright 90  degrees    Other  Recommendations Oral Care Recommendations: Oral care BID  Follow Up Recommendations   (tba)                SLP Swallow Goals     General Date of Onset: 03/27/14 HPI: 79 yo male admitted for SOB, productive cough, and confusion  with LLL airspace disease per CXR. PMH includes COPD, ILD, GERD,  hypertension. Pt had MBS in 2013 with deep, silent penetration to  the vocal cords with large sips of thin liquid. SLP recommended a  regular diet and thin liquids with small, single sips to increase  airway protection.  Pt has been followed for several days with  increasing concerns for aspiration.  Has required a partial  nonrebreather.  Recent CXR showed worsened biltaeral basilar  predominant opacities.  Has been coughing with meals, due in part  to rushing rather than eating more  slowly and allowing time for  respiration.  MBS recommended.   Type of Study: Modified Barium Swallowing Study Reason for Referral: Objectively evaluate swallowing function Previous Swallow Assessment: see HPI Diet Prior to this Study: Dysphagia 3 (soft);Nectar-thick liquids Temperature Spikes Noted: No Respiratory Status: non-rebreather History of Recent Intubation: No Behavior/Cognition: Alert;Cooperative Oral Cavity - Dentition: Adequate natural dentition Oral Motor / Sensory Function: Within functional limits Self-Feeding Abilities: Able to feed self Patient Positioning: Upright in chair Baseline Vocal Quality: Clear Volitional Cough: Strong Volitional Swallow: Able to elicit Anatomy: Within functional limits Pharyngeal Secretions: Not observed secondary MBS    Reason for Referral Objectively evaluate swallowing function   Oral Phase Oral Preparation/Oral Phase Oral Phase: WFL   Pharyngeal Phase Pharyngeal Phase Pharyngeal Phase: Impaired Pharyngeal - Thin Pharyngeal - Thin Cup: Delayed swallow  initiation;Penetration/Aspiration during swallow;Pharyngeal  residue - valleculae Penetration/Aspiration details (thin cup): Material enters  airway, remains ABOVE vocal cords and not ejected out Pharyngeal - Solids Pharyngeal - Puree: Premature spillage to valleculae;Pharyngeal  residue - valleculae Pharyngeal - Regular: Delayed swallow initiation;Pharyngeal  residue - valleculae  Cervical Esophageal Phase    Amanda L. Samson Frederic, [place] CCC/SLP Pager [phone]              Blenda Mounts Laurice 03/27/2014, 2:40 PM     Scheduled Meds: . amLODipine  10 mg Oral Daily  . antiseptic oral rinse  7 mL Mouth Rinse BID  . aspirin EC  81 mg Oral Daily  . dutasteride  0.5 mg Oral Daily  . enoxaparin (LOVENOX) injection  40 mg Subcutaneous Q24H  . feeding supplement (ENSURE COMPLETE)  237 mL Oral BID BM  . furosemide  60 mg Oral Daily  . guaiFENesin  600 mg Oral BID  . ipratropium-albuterol  3 mL Nebulization TID  .  losartan  50 mg Oral Daily  . pantoprazole  40 mg Oral Daily  . potassium chloride  20 mEq Oral BID  . pravastatin  40 mg Oral Daily  . tamsulosin  0.4 mg Oral Daily   Continuous Infusions:   Principal Problem:   Acute on chronic respiratory failure with hypoxemia Active Problems:   COPD with emphysema Gold C    ILD (interstitial lung disease)   Pulmonary hypertension   Hypertension   Collagenous colitis   DNR (do not resuscitate)   Pneumonia   Accidental fall from bed   Acute encephalopathy   Palliative care patient    Time spent: 40 minutes   [facility]  [facility] Pager [phone]. If 7PM-7AM, please contact night-coverage at [email or web address], password [facility] 03/31/2014, 10:24 AM  LOS: 10 days

## 2014-03-31 NOTE — Progress Notes (Signed)
PULMONARY  / CRITICAL CARE MEDICINE CONSULTATION   Name: Jim Contreras MRN: [id number] DOB: [birth year]    ADMISSION DATE:  03/21/2014 CONSULTATION DATE: 03/24/2014  REQUESTING CLINICIAN: Dr. Susie CassetteAbrol PRIMARY SERVICE: TRH  CHIEF COMPLAINT:  SOB  BRIEF PATIENT DESCRIPTION: [age] M with COPD/ILD on chronic O2, patient of PW, who presented 10/8 with SOB likely 2/2 COPD exacerbation.  SIGNIFICANT EVENTS / STUDIES:  10/08 ABG > 7.491/28.6/51.0 10/11 Chest X-ray > with worsening bibasilar opacities concerning for pneumonia 10/15 On SDU, requiring increased O2, Partial NRB  LINES / TUBES: PIV  CULTURES:  Blood Cultures 10/8 >> Negative Urine Culture 10/8 >> Negative Flu Panel 10/8 >> Negative   SUBJECTIVE:  Remains on HFC 80%. 50L flow.   VITAL SIGNS: Temp:  [97.9 F (36.6 C)-98.6 F (37 C)] 98.6 F (37 C) (10/18 0754) Pulse Rate:  [53-68] 54 (10/18 0754) Resp:  [14-17] 17 (10/18 0754) BP: (106-118)/(43-61) 106/44 mmHg (10/18 0754) SpO2:  [89 %-99 %] 95 % (10/18 0754) FiO2 (%):  [50 %-80 %] 80 % (10/18 0754) Weight:  [70 kg (154 lb 5.2 oz)] 70 kg (154 lb 5.2 oz) (10/18 0314)     VENTILATOR SETTINGS: Vent Mode:  [-]  FiO2 (%):  [50 %-80 %] 80 %  INTAKE / OUTPUT: Intake/Output     10/17 0701 - 10/18 0700 10/18 0701 - 10/19 0700   P.O.     Total Intake(mL/kg)     Net            Urine Occurrence 4 x 1 x   Stool Occurrence  1 x     PHYSICAL EXAMINATION: General:  Elderly male in NAD on HFO 80% but in no distress Neuro:  Grossly intact HEENT:  Sclera anicteric, conjunctiva pink, MMM, OP clear Neck: Trachea supple and midline, (-) LAN or JVD Cardiovascular:  RRR, nS1/S2, (-) MRG Lungs:  Distant with mild rales bilaterally at the bases Abdomen:  S/NT/ND/(+)BS Musculoskeletal:  (-) C/C/E Skin:  Grossly Intact  LABS:  CBC  Recent Labs Lab 03/25/14 0335 03/27/14 0332  WBC 7.8 9.9  HGB 12.0* 13.4  HCT 38.0* 42.0  PLT 194 204   BMET  Recent Labs Lab  03/27/14 0332 03/29/14 0252 03/31/14 0329  NA 140 139 137  K 4.3 4.2 4.7  CL 102 99 98  CO2 28 29 28   BUN 31* 32* 35*  CREATININE 0.92 1.27 1.35  GLUCOSE 110* 105* 109*   Electrolytes  Recent Labs Lab 03/26/14 0232 03/27/14 0332 03/29/14 0252 03/31/14 0329  CALCIUM 8.5 8.3* 8.3* 8.8  MG 2.3  --   --   --   PHOS 2.7  --   --   --    Sepsis Markers No results found for this basename: LATICACIDVEN, PROCALCITON, O2SATVEN,  in the last 168 hours ABG No results found for this basename: PHART, PCO2ART, PO2ART,  in the last 168 hours Liver Enzymes  Recent Labs Lab 03/25/14 0335 03/27/14 0332 03/29/14 0252  AST 22 18 14   ALT 16 20 14   ALKPHOS 41 43 49  BILITOT 0.3 0.3 0.4  ALBUMIN 2.6* 2.6* 2.6*   Cardiac Enzymes  Recent Labs Lab 03/24/14 2158  PROBNP 1012.0*   Glucose  Recent Labs Lab 03/27/14 1118  GLUCAP 130*    Imaging No results found.  CXR: no film 10/17  ASSESSMENT / PLAN:  Acute on Chronic Hypoxic Respiratory Failure: With elevated WBC count on presentation and progressive bibasilar infiltrates.. Pneumonia can also explain the worsened  falls noted recently. No active wheeze.  ILD with possible flare, prob chronic aspiration  Severe COPD / emphysema endstage  Plan: -prednisone course completed -Continue Duonebs and PRN albuterol -Dose lasix daily as renal fxn / BP permit.  -get pt oximiser, titrate HFO for sats 82-85% -I would accept SpO2 of > 82 %. Will ask for an oximizer  -rediscussed Eol issues with the pt, and daughter.  Daughter states entire family understands pt situation and would like to go to either residential hospice or home with hospice.  I can be primary for hospice Rate limit step is getting pt off the high flow oxygen system [facility] or home with two 10L concentrators y together  Pall care consulted  I have personally obtained a history, examined the patient, evaluated laboratory and  imaging results, formulated the assessment and plan and placed orders.  Jim Carrowatrick WrightMD Beeper  [phone]  Cell  [phone]  If no response or cell goes to voicemail, call beeper [phone]  03/31/2014, 9:57 AM

## 2014-04-01 MED ORDER — WHITE PETROLATUM GEL
Status: AC
  Administered 2014-04-01: 1
  Filled 2014-04-01: qty 5

## 2014-04-01 MED ORDER — TRAMADOL-ACETAMINOPHEN 37.5-325 MG PO TABS
1.0000 | ORAL_TABLET | Freq: Three times a day (TID) | ORAL | Status: DC | PRN
  Administered 2014-04-02: 1 via ORAL
  Filled 2014-04-01: qty 1

## 2014-04-01 NOTE — Progress Notes (Signed)
PULMONARY  / CRITICAL CARE MEDICINE CONSULTATION   Name: Jim Contreras MRN: [id number] DOB: [birth year]    ADMISSION DATE:  03/21/2014 CONSULTATION DATE: 03/24/2014  REQUESTING CLINICIAN: Dr. Susie CassetteAbrol PRIMARY SERVICE: TRH  CHIEF COMPLAINT:  SOB  BRIEF PATIENT DESCRIPTION: [age] M with COPD/ILD on chronic O2, patient of PW, who presented 10/8 with SOB likely 2/2 COPD exacerbation.  SIGNIFICANT EVENTS / STUDIES:  10/08 ABG > 7.491/28.6/51.0 10/11 Chest X-ray > with worsening bibasilar opacities concerning for pneumonia 10/15 On SDU, requiring increased O2, Partial NRB  LINES / TUBES: PIV  CULTURES:  Blood Cultures 10/8 >> Negative Urine Culture 10/8 >> Negative Flu Panel 10/8 >> Negative   SUBJECTIVE:  Remains on HFC 50%. 50L flow.  Comfortable.  Denies SOB.    VITAL SIGNS: Temp:  [97.7 F (36.5 C)-98.3 F (36.8 C)] 97.9 F (36.6 C) (10/19 0957) Pulse Rate:  [54-66] 54 (10/19 0957) Resp:  [16-20] 19 (10/19 0957) BP: (100-120)/(36-53) 100/40 mmHg (10/19 1052) SpO2:  [87 %-97 %] 93 % (10/19 0957) FiO2 (%):  [50 %-73.5 %] 50 % (10/19 0921)   INTAKE / OUTPUT: Intake/Output     10/18 0701 - 10/19 0700 10/19 0701 - 10/20 0700   P.O. 118    Total Intake(mL/kg) 118 (1.7)    Net +118          Urine Occurrence 4 x    Stool Occurrence 2 x      PHYSICAL EXAMINATION: General:  Pleasant, elderly male in NAD on high flow O2 but in no distress Neuro:  Grossly intact, MAE HEENT:  Sclera anicteric, conjunctiva pink, MMM, OP clear Neck: Trachea supple and midline, (-) LAN or JVD Cardiovascular:  RRR, nS1/S2, (-) MRG Lungs:  resps even, non labored on high flow [place], Distant with mild rales bilaterally at the bases Abdomen:  S/NT/ND/(+)BS Musculoskeletal:  (-) C/C/E Skin:  Grossly Intact  LABS:  CBC  Recent Labs Lab 03/27/14 0332  WBC 9.9  HGB 13.4  HCT 42.0  PLT 204   BMET  Recent Labs Lab 03/27/14 0332 03/29/14 0252 03/31/14 0329  NA 140 139 137  K 4.3 4.2  4.7  CL 102 99 98  CO2 28 29 28   BUN 31* 32* 35*  CREATININE 0.92 1.27 1.35  GLUCOSE 110* 105* 109*   Electrolytes  Recent Labs Lab 03/26/14 0232 03/27/14 0332 03/29/14 0252 03/31/14 0329  CALCIUM 8.5 8.3* 8.3* 8.8  MG 2.3  --   --   --   PHOS 2.7  --   --   --    Sepsis Markers No results found for this basename: LATICACIDVEN, PROCALCITON, O2SATVEN,  in the last 168 hours ABG No results found for this basename: PHART, PCO2ART, PO2ART,  in the last 168 hours Liver Enzymes  Recent Labs Lab 03/27/14 0332 03/29/14 0252  AST 18 14  ALT 20 14  ALKPHOS 43 49  BILITOT 0.3 0.4  ALBUMIN 2.6* 2.6*   Cardiac Enzymes No results found for this basename: TROPONINI, PROBNP,  in the last 168 hours Glucose  Recent Labs Lab 03/27/14 1118  GLUCAP 130*    Imaging No results found.  CXR: no new film   ASSESSMENT / PLAN:  Acute on Chronic Hypoxic Respiratory Failure: With elevated WBC count on presentation and progressive bibasilar infiltrates. Pneumonia can also explain the increased falls noted recently. No active wheeze.  ILD with possible flare, prob chronic aspiration  Severe COPD / emphysema endstage Pulm HTN  Plan: -prednisone course completed -  Continue Duonebs and PRN albuterol - aggressive pulm hygiene as able  -Dose lasix daily as renal fxn / BP permit.  - titrate HFO for sats 82-85% -Will accept SpO2 of > 80 %, goal by an oximizer  -Palliative care following.  Goal is titration of O2 down to 8-10L for [facility] v home with hospice (with two 10L concentrators together).  -PRN morphine for dyspnea  -Dr. Delford FieldWright can be Hospice MD if needed   Jim DressKaty Whiteheart, Jim Contreras 04/01/2014  11:34 AM Pager: [phone] or [phone]   Jim Pupaobert Dior Dominik, MD, PhD 04/01/2014, 12:29 PM [facility] [phone] or if no answer [phone]

## 2014-04-01 NOTE — Consult Note (Signed)
Patient [id number] Jim Contreras      DOB: Oct 22, 1923      GLO:[id number]  Summary of Initial Consult;full note to follow:  Met with patient and spoke with daughter via phone.  Total time:  345 pm- 440 pm. With patient 20 min   Patient states his goal is to "get home" where he cares for himself.  He states he is feeling fine and tells me that he worked at a [employer] for his career.  Spoke with his daughter.  Jim Contreras able to recount prognosis and family understanding of the options for care.  She feels her father has not really been able to take in but a 1/4 of the information related to his prognosis.  He has been having more cognitive incapacity over the last month which he tries to hide.  He is not the kind of man to just lay down and die per his daughter.  Family understand even if he does not that he likely needs hospice support which can not be provided in any other their homes.  Jim Contreras eager to meet with me with her dad tomorrow to see how he can be a part of this conversation.  Jim Contreras reiterates that his Advanced care plans are in place that he designated himself a DNR and she is pleased that she has his voice to speak for himself on this matter.   Will remeet with patient and family at 53 tomorrow.   Recommend :  1. DNR  2.  Acute on Chronic Respiratory failure: tolerating the HFNC with reasonable sats.  No obvious work of breathing, completing sentences.It doesn't look like he has used any morphine or tramadol.  Roxanol 20 mg/ml 5 mg SL q 1-2 hours dispense 30 ml should be consider at discharge for symptom management.  3.  Cognitive status : good today per daughter but he is not really able to verbalize what he knows and what he wants to know at his current state.   4.  Anxiety not an issue at present. Monitor.   Jim Petter L. Lovena Le, MD MBA The Palliative Medicine Team at [facility] Phone: [phone] Pager: [phone] ( Use team phone after hours)

## 2014-04-01 NOTE — Consult Note (Signed)
Patient UX:[id number]:Jim Contreras      DOB: 28-Apr-1924      NUU:[id number]     Consult [facility]    Consult Requested by: Dr. Susie CassetteAbrol     PCP: Laurell JosephsMorrow, Aaron P, MD Reason for Consultation: Goals of care     Phone Number:[phone] Related symptom recommendations Assessment of patients Current state: 80 yr old white male with a history of interstitial lung disease and CoPD. Patient was admitted with respiratory failure related to the same, and probable pneumonia.  He has been told by his pulmonologist that he may not get better this time. His daughter has been prepared that this may be an end stage situation for him and I have been asked to assist with goals of care.  The patient evidently had a hard time transitioning form his home to his current assisted living setting. See my note from the date of service.  Patient does not have a full understanding of his illness.  His daughter is struggling with what to do for him. The patient himself was recently able to help his daughter decide on DNR which is affirmed.   Daughter would like to talk with him again in the morning to see what he has retained and understood.  In the mean time we can try to titrate him to oxymizer if primary service and pulmonary agreeable, as I know that most facilities can not support the high flow oxygen.  Continue to treat the treatable for now and promote aggressive pulmonary toilet.   Goals of Care: 1.  Code Status: DNR   2. Scope of Treatment: 1.  Aggressive pulmonary toilet.Continue to treat the treatable as we talk with the patient about his condition and assess his needs.     4. Disposition: to be determined.  Will need to see if he can tolerate oxymizer and what that will mean for location of care.   3. Symptom Management:   1. Anxiety/Agitation: Not an issue at present but may become one if he continues to decline. 2. Pain: none at present can use Roxanol for dyspnea. 20  mg/ml 5 mg SL q 1-2 hours prn dispense 30 ml. 3. Bowel Regimen: monitor  4. 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  He relies heavily on his daughter for decision making and remembering issues.   5. Spiritual: Christian faith.        Patient Documents Completed or Given: Document Given Completed  Advanced Directives Pkt    MOST    DNR    Gone from My Sight    Hard Choices      Brief HPI: 80 yr old white male with COPD and ILD felt to be approaching end of life.  We have been asked to assist with goals of care.   ROS: he denies shortness of breath but is using accessory muscles, denies pain , nausea or vomiting and has had a poor appetite.    PMH:  Past Medical History  Diagnosis Date  . Hypertension   . Hypercholesteremia   . BPH (benign prostatic hyperplasia)   . ILD (interstitial lung disease)   . COPD, moderate   . Stroke     Thalamic     PSH: Past Surgical History  Procedure Laterality Date  . Cataract extraction Bilateral   . Appendectomy    . Hernia repair    . Flexible sigmoidoscopy N/A 04/24/2013    Procedure: FLEXIBLE SIGMOIDOSCOPY;  Surgeon: Laurena BeringVincent C.  Bosie ClosSchooler, MD;  [facility];  Service: Endoscopy;  Laterality: N/A;   I have reviewed the FH and SH and  If appropriate update it with new information. Allergies  Allergen Reactions  . Floxin [Ofloxacin] Other (See Comments)    Lost feeling in feet  . Penicillins Other (See Comments)    Ulcers in mouth  . Sulfa Antibiotics Other (See Comments)    Ulcers in mouth   Scheduled Meds: . amLODipine  10 mg Oral Daily  . antiseptic oral rinse  7 mL Mouth Rinse BID  . aspirin EC  81 mg Oral Daily  . dutasteride  0.5 mg Oral Daily  . enoxaparin (LOVENOX) injection  40 mg Subcutaneous Q24H  . feeding supplement (ENSURE COMPLETE)  237 mL Oral BID BM  . furosemide  60 mg Oral Daily  . guaiFENesin  600 mg Oral BID  . ipratropium-albuterol  3 mL Nebulization TID  . losartan   50 mg Oral Daily  . pantoprazole  40 mg Oral Daily  . potassium chloride  20 mEq Oral BID  . pravastatin  40 mg Oral Daily  . tamsulosin  0.4 mg Oral Daily   Continuous Infusions:  PRN Meds:.albuterol, hydrALAZINE, menthol-cetylpyridinium, traMADol-acetaminophen, traZODone    BP 104/44  Pulse 58  Temp(Src) 97.7 F (36.5 C) (Oral)  Resp 18  Ht 5\' 7"  (1.702 m)  Wt 70 kg (154 lb 5.2 oz)  BMI 24.16 kg/m2  SpO2 93%   PPS: 30%   Intake/Output Summary (Last 24 hours) at 04/01/14 1645 Last data filed at 04/01/14 1500  Gross per 24 hour  Intake    358 ml  Output      0 ml  Net    358 ml    Physical Exam:  General: mod distress related to work of breathing, awake, somewhat confused HEENT:  PERRL, EOMI, anicteric, mmm Chest:   Decreased with bilateral course wet rhonchi, no wheezing, crackles left greater than right base CVS: brady, S1, S2 Abdomen:soft, scaphoid, not tender or distended. Ext: warm, ecchymotic areas Neuro: oriented to self and his daughter but with significant short term memory deficits.  Labs: CBC    Component Value Date/Time   WBC 9.9 03/27/2014 0332   RBC 5.38 03/27/2014 0332   HGB 13.4 03/27/2014 0332   HCT 42.0 03/27/2014 0332   PLT 204 03/27/2014 0332   MCV 78.1 03/27/2014 0332   MCH 24.9* 03/27/2014 0332   MCHC 31.9 03/27/2014 0332   RDW 16.8* 03/27/2014 0332   LYMPHSABS 0.3* 03/22/2014 0253   MONOABS 0.3 03/22/2014 0253   EOSABS 0.0 03/22/2014 0253   BASOSABS 0.0 03/22/2014 0253      CMP     Component Value Date/Time   NA 137 03/31/2014 0329   K 4.7 03/31/2014 0329   CL 98 03/31/2014 0329   CO2 28 03/31/2014 0329   GLUCOSE 109* 03/31/2014 0329   BUN 35* 03/31/2014 0329   CREATININE 1.35 03/31/2014 0329   CALCIUM 8.8 03/31/2014 0329   PROT 6.0 03/29/2014 0252   ALBUMIN 2.6* 03/29/2014 0252   AST 14 03/29/2014 0252   ALT 14 03/29/2014 0252   ALKPHOS 49 03/29/2014 0252   BILITOT 0.4 03/29/2014 0252   GFRNONAA 45* 03/31/2014 0329    GFRAA 52* 03/31/2014 0329    Chest Xray Reviewed/Impressions:  Stable left lower lobe airspace disease with worsening right basilar airspace opacity. Findings concerning for worsening multifocal pneumonia   CT scan of the [place] Reviewed/Impressions: 1. No evidence of  acute pulmonary embolus. 2. Chronic emphysema and interstitial lung disease with superimposed small pleural effusions and acute right lower lobe opacity suspicious for pneumonia. 3. Superimposed chronic and progressed right lower lobe hyperdense material with a diagnosis of alveolar microlithiasis considered on the prior study of 2014. 4. Chronic but progressed T12 compression fracture    Time In Time Out Total Time Spent with Patient Total Overall Time  345 pm 440 pm  20 min 55 min    Greater than 50%  of this time was spent counseling and coordinating care related to the above assessment and plan.  Tudor Chandley L. Ladona Ridgel, MD MBA The Palliative Medicine Team at [facility] Phone: [phone] Pager: [phone] ( Use team phone after hours)

## 2014-04-01 NOTE — Progress Notes (Signed)
[facility] PROGRESS NOTE  LINDLEY HINEY ZOX:[id number] DOB: [birth year] DOA: 03/21/2014 PCP: [name], MD  Assessment/Plan: Principal Problem:   Acute on chronic respiratory failure with hypoxemia Active Problems:   COPD with emphysema Gold C    ILD (interstitial lung disease)   Pulmonary hypertension   Hypertension   Collagenous colitis   DNR (do not resuscitate)   Pneumonia   Accidental fall from bed   Acute encephalopathy   Palliative care patient    Patient with COPD Gold Stage C admitted with dyspnea and acute hypoxemic resp failure  Acute on chronic hypoxemic respiratory failure secondary to left lower lobe pneumonia-not improving  Healthcare associated pneumonia  COPD exacerbation  Interstitial lung disease with pulmonary hypertension  Critical care recommends to maintain SpO2 of > 85%. Obtain oximizer and uptitrate his O2  Discontinued Azithromycin, Azactam, and Vancomycin on 10/11  10/11-10/15 on Imipenem 250 mg IV q6h ,  10/15-Started Rocephin and azithromycin , completing antibiotics . 8 days  Completed steroids, continue Lasix to 60 mg a day  Continue chest percussion therapy, out of bed to chair , start patient on incentive spirometry  Speech therapy evaluation recommends regular diet and thin liquids  VQ scan intermediate probability, therefore did CT angiography which was negative for PE  Venous Doppler of the bilateral lower extremities negative for DVT  get pt oximiser, titrate HFO for sats 82-85%  Acceptable SpO2 of > 82 %. , Currently on high flow nasal cannula, and 50% FiO2 We can get pt to [facility] on 8-10L flow or home with two 10L concentrators  together  Palliative care consult ordered, hospice eligible  Okay to give morphine for dyspnea overnight  Probable discharge tomorrow to home with hospice    Hypokalemia replete , follow BMP q. 48 hours   Chronic stable Diastolic heart failure  Continue Lasix to 60 mg a day  2-D echo ,  showed diastolic heart failure EF of 65-70%, discussed with the patient's daughter  Follow BMP    Hypertension  Continue Norvasc, Lasix  BPH  Continue Avodart and Flomax  GERD  continue Protonix  Acute encephalopathy  Fall from bed prior to admission-follow mental status  - PT eval recommended SNF  Fall precautions    Code Status: full  Family Communication: family updated about patient's clinical progress  Disposition Plan: Probably hospice eligible, consult with social worker   Brief narrative:  KINGSTIN HEIMS is a 80 y.o. male with Past medical history of hypertension, COPD with chronic resp failure on chronic o2 3LPM, CVA, interstitial lung disease, BPH, diastolic dysfunction, pulmonary hypertension.  The patient is presenting with complaints of shortness of breath ongoing with last 1 week progressively worsening along with cough with expectoration. He also had fever and chills. He denies any chest pain. He denies any choking episode. He is from an assisted living facility. No recent travel reported. No leg swelling or leg tenderness. No palpitation dizziness lightheadedness nausea vomiting abdominal pain diarrhea constipation burning urination.  Medication has been notified based on the documentation available from the nursing home and no recent change identified.  Initially the history was reported that the patient had a fall twice on my discussion with the nursing home staff today while the patient was coming out of the bed when his wheelchair was not locked, He fell down on the ground, no head injury or neck injury reported.  Also since last 2 days the patient has not been acting himself as per  the nursing home staff.  The patient is coming from SNF. And at his baseline independent for most of his ADL.  Consultants:  None Procedures:  None Antibiotics:  Vancomycin aztreonam/azithromycin discontinued 10/11  imipenem on 10/11-10/15  Rocephin, azithromycin started 10/15 ,  stopped 10/15   HPI/Subjective:  Sitting comfortably in bed, no complaints   Objective: Filed Vitals:   04/01/14 0500 04/01/14 0921 04/01/14 0957 04/01/14 1052  BP: 120/53  102/36 100/40  Pulse: 62  54   Temp: 97.7 F (36.5 C)  97.9 F (36.6 C)   TempSrc: Oral  Oral   Resp: 16  19   Height:      Weight:      SpO2: 95% 97% 93%     Intake/Output Summary (Last 24 hours) at 04/01/14 1124 Last data filed at 03/31/14 1754  Gross per 24 hour  Intake    118 ml  Output      0 ml  Net    118 ml    Exam:  General: alert & oriented x 3 In NAD  Cardiovascular: RRR, nl S1 s2  Respiratory: Decreased breath sounds at the bases, scattered diffuse rhonchi, no crackles  Abdomen: soft +BS NT/ND, no masses palpable  Extremities: No cyanosis and no edema      Data Reviewed: Basic Metabolic Panel:  Recent Labs Lab 03/26/14 0232 03/27/14 0332 03/29/14 0252 03/31/14 0329  NA 142 140 139 137  K 3.5* 4.3 4.2 4.7  CL 100 102 99 98  CO2 30 28 29 28   GLUCOSE 103* 110* 105* 109*  BUN 31* 31* 32* 35*  CREATININE 0.97 0.92 1.27 1.35  CALCIUM 8.5 8.3* 8.3* 8.8  MG 2.3  --   --   --   PHOS 2.7  --   --   --     Liver Function Tests:  Recent Labs Lab 03/27/14 0332 03/29/14 0252  AST 18 14  ALT 20 14  ALKPHOS 43 49  BILITOT 0.3 0.4  PROT 6.2 6.0  ALBUMIN 2.6* 2.6*   No results found for this basename: LIPASE, AMYLASE,  in the last 168 hours No results found for this basename: AMMONIA,  in the last 168 hours  CBC:  Recent Labs Lab 03/27/14 0332  WBC 9.9  HGB 13.4  HCT 42.0  MCV 78.1  PLT 204    Cardiac Enzymes: No results found for this basename: CKTOTAL, CKMB, CKMBINDEX, TROPONINI,  in the last 168 hours BNP (last 3 results)  Recent Labs  03/24/14 2158  PROBNP 1012.0*     CBG:  Recent Labs Lab 03/27/14 1118  GLUCAP 130*    Recent Results (from the past 240 hour(s))  URINE CULTURE     Status: None   Collection Time    03/23/14  9:00 PM       Result Value Ref Range Status   Specimen Description URINE, CLEAN CATCH   Final   Special Requests NONE   Final   Culture  Setup Time     Final   Value: 03/23/2014 21:40     Performed at [facility]   Colony Count     Final   Value: NO GROWTH     Performed at [facility]   Culture     Final   Value: NO GROWTH     Performed at [facility]   Report Status 03/25/2014 FINAL   Final     Studies: Ct Head Wo Contrast  03/21/2014  CLINICAL DATA:  Altered mental status after fall.  EXAM: CT HEAD WITHOUT CONTRAST  TECHNIQUE: Contiguous axial images were obtained from the base of the skull through the vertex without intravenous contrast.  COMPARISON:  CT scan of December 31, 2012.  FINDINGS: Bony calvarium appears intact. Diffuse cortical atrophy is noted. Mild chronic ischemic white matter disease is noted. No mass effect or midline shift is noted. Ventricular size is within normal limits. There is no evidence of mass lesion, hemorrhage or acute infarction.  IMPRESSION: Diffuse cortical atrophy. Mild chronic ischemic white matter disease. No acute intracranial abnormality seen.   Electronically Signed   By: Roque Lias M.D.   On: 03/21/2014 18:26   Ct Angio Chest Pe W/cm &/or Wo Cm  03/26/2014   CLINICAL DATA:  45 -year-old male with acute shortness of breath. Intermediate V/Q scan. Initial encounter.  EXAM: CT ANGIOGRAPHY CHEST WITH CONTRAST  TECHNIQUE: Multidetector CT imaging of the chest was performed using the standard protocol during bolus administration of intravenous contrast. Multiplanar CT image reconstructions and MIPs were obtained to evaluate the vascular anatomy.  CONTRAST:  62mL OMNIPAQUE IOHEXOL 350 MG/ML SOLN  COMPARISON:  High-resolution Chest CT 05/01/2013. Portable chest x-ray 03/24/2014. V/Q scan 03/25/2014.  FINDINGS: Good contrast bolus timing in the pulmonary arterial tree. Respiratory motion artifact at both lung bases. No focal filling defect  identified in the pulmonary arterial tree to suggest the presence of acute pulmonary embolism.  Chronic emphysema. Mildly lower lung volumes compared to 2014. Central airways are patent except for a atelectatic changes. Small bilateral layering pleural effusions. Combination of compressive lower lobe atelectasis and also some patchy right lower lobe pulmonary opacity. In the peripheral right lower lobe there is hyperdense opacity (series 4, image 73 which might be previously aspirated oral contrast, uncertain. However, this was also present to a degree in 2014 and at that time alveolar or microlithiasis was considered. Chronic peripheral increased reticular opacity in the mid lungs is stable.  No pericardial effusion. Calcified coronary artery and aortic atherosclerosis. Stable small right peritracheal and mediastinal lymph nodes. No axillary lymphadenopathy. Negative thoracic inlet.  Negative visualized liver, spleen, pancreas, adrenal glands, kidneys. New moderate-sized gastric hiatal hernia.  Chronic left costovertebral angle rib fractures (e.g. Left seventh rib series 6, image 38). Osteopenia. Mild T12 compression fracture appears nonacute but increased since 2014. No acute osseous abnormality identified.  Review of the MIP images confirms the above findings.  IMPRESSION: 1.  No evidence of acute pulmonary embolus. 2. Chronic emphysema and interstitial lung disease with superimposed small pleural effusions and acute right lower lobe opacity suspicious for pneumonia. 3. Superimposed chronic and progressed right lower lobe hyperdense material with a diagnosis of alveolar microlithiasis considered on the prior study of 2014. 4. Chronic but progressed T12 compression fracture.   Electronically Signed   By: Augusto Gamble M.D.   On: 03/26/2014 13:28   Nm Pulmonary Perf And Vent  03/25/2014   CLINICAL DATA:  Acute on chronic respiratory failure. Hypoxemia. COPD. Emphysema. Encephalopathy.  EXAM: NUCLEAR MEDICINE  VENTILATION - PERFUSION LUNG SCAN  TECHNIQUE: Ventilation images were obtained in multiple projections using inhaled aerosol technetium 99 M DTPA. Perfusion images were obtained in multiple projections after intravenous injection of Tc-59m MAA.  RADIOPHARMACEUTICALS:  6.0 mCi Tc-5m DTPA aerosol and 40 mCi Tc-3m MAA  COMPARISON:  03/24/2014  FINDINGS: Ventilation: Abnormal accumulation of radiotracer medially near the hilum in both lungs. Hypoventilation at the right lung base and in the lingula. Hypoventilation in  the lung apices.  Perfusion: The patient was apparently unable to raise the right arm, leading to a photopenic defect on the lateral projection. Patchy moderate-sized perfusion defect posteriorly in the right lung corresponding to radiographic and ventilation abnormalities. Patchy hypoperfusion in both upper lobes.  IMPRESSION: 1. Intermediate probability of pulmonary embolus (risk 20-79%) due to triple matched large segmental defects in the lower lung zone and multiple moderate to large matched the defects in the upper lungs. There is known airspace opacity in the lung bases as well as severe emphysema especially affecting the upper lobes.   Electronically Signed   By: Herbie BaltimoreWalt  Liebkemann M.D.   On: 03/25/2014 13:22   Dg Chest Port 1 View  03/24/2014   CLINICAL DATA:  Shortness of Breath.  COPD.  Smoker.  A a seen  EXAM: PORTABLE CHEST - 1 VIEW  COMPARISON:  03/21/2014  FINDINGS: Continued left lower lobe airspace disease is similar prior study. In addition, there is increasing right basilar airspace opacity. Findings concerning for multifocal pneumonia.  Underlying COPD/hyperinflation. Heart is borderline in size. No effusions or acute bony abnormality.  IMPRESSION: Stable left lower lobe airspace disease with worsening right basilar airspace opacity. Findings concerning for worsening multifocal pneumonia.   Electronically Signed   By: Charlett NoseKevin  Dover M.D.   On: 03/24/2014 15:17   Dg Chest Port 1  View  03/21/2014   CLINICAL DATA:  Confusion fever.  Hypertension.  COPD.  Cough.  EXAM: PORTABLE CHEST - 1 VIEW  COMPARISON:  12/31/2012.  03/09/2012  FINDINGS: Midline trachea. Mild cardiomegaly with atherosclerosis in the transverse aorta. No right and no definite left pleural effusion. Lower lobe predominant interstitial thickening is moderate. Right upper lobe scarring. More confluent left lower lobe patchy airspace disease.  IMPRESSION: Left lower lobe airspace disease, suspicious for infection or aspiration. Progression of interstitial lung disease is felt less likely.  Interstitial lung disease as was detailed on 05/01/2013 high-resolution CT. Considerations include alveolar microlithiasis superimposed upon nonspecific interstitial pneumonitis.  Cardiomegaly without congestive failure.   Electronically Signed   By: Jeronimo GreavesKyle  Talbot M.D.   On: 03/21/2014 17:58   Dg Swallowing Func-speech Pathology  03/27/2014   Carolan ShiverAmanda Laurice Couture, CCC-SLP     03/27/2014  2:45 PM Objective Swallowing Evaluation: Modified Barium Swallowing Study   Patient Details  Name: Georgina QuintJames R Hirt MRN: [id number] Date of Birth: 17-Apr-1924  Today's Date: 03/27/2014 Time: [id number] SLP Time Calculation (min): 25 min  Past Medical History:  Past Medical History  Diagnosis Date  . Hypertension   . Hypercholesteremia   . BPH (benign prostatic hyperplasia)   . ILD (interstitial lung disease)   . COPD, moderate   . Stroke     Thalamic   Past Surgical History:  Past Surgical History  Procedure Laterality Date  . Cataract extraction Bilateral   . Appendectomy    . Hernia repair    . Flexible sigmoidoscopy N/A 04/24/2013    Procedure: FLEXIBLE SIGMOIDOSCOPY;  Surgeon: Shirley FriarVincent C.  Schooler, MD;  [facility];  Service: Endoscopy;   Laterality: N/A;   HPI:  80 yo male admitted for SOB, productive cough, and confusion with  LLL airspace disease per CXR. PMH includes COPD, ILD, GERD,  hypertension. Pt had MBS in 2013 with deep, silent  penetration to  the vocal cords with large sips of thin liquid. SLP recommended a  regular diet and thin liquids with small, single sips to increase  airway protection.  Pt has been followed for several days  with  increasing concerns for aspiration.  Has required a partial  nonrebreather.  Recent CXR showed worsened biltaeral basilar  predominant opacities.  Has been coughing with meals, due in part  to rushing rather than eating more slowly and allowing time for  respiration.  MBS recommended.       Assessment / Plan / Recommendation Clinical Impression  Dysphagia Diagnosis: Mild pharyngeal phase dysphagia  Clinical impression: Pt's swallow abilities appeared to be quite  functional during MBS.  Demonstrated only occasional penetration  of thin liquids, but it was not observed to reach the level of  the vocal cords.  There was mild pharyngeal residue post-swallow.   No aspiration. During assessment, pt's pace of consumption was  controlled by examiner.  When pt is functionally eating/drinking,   he has been observed to do so at rapid rates, leading to  clinical signs of aspiration.    Recommend regular diet, thin liquids but strict precautions to  minimize potential for aspiration:  SLOW RATE; REPLACE NRB BETWEEN BOLUSES OF FOOD/LIQUID AND ALLOW  5-6 BREATHS BEFORE RESUMING EATING.    Treatment Recommendation    Continue per original plan   Diet Recommendation Regular;Thin liquid   Liquid Administration via: Cup;Straw Medication Administration: Whole meds with puree Supervision: Patient able to self feed;Intermittent supervision  to cue for compensatory strategies Compensations: Slow rate;Small sips/bites (breathe 5-6 times  between swallows) Postural Changes and/or Swallow Maneuvers: Seated upright 90  degrees    Other  Recommendations Oral Care Recommendations: Oral care BID   Follow Up Recommendations   (tba)                SLP Swallow Goals     General Date of Onset: 03/27/14 HPI: 80 yo male admitted for SOB,  productive cough, and confusion  with LLL airspace disease per CXR. PMH includes COPD, ILD, GERD,  hypertension. Pt had MBS in 2013 with deep, silent penetration to  the vocal cords with large sips of thin liquid. SLP recommended a  regular diet and thin liquids with small, single sips to increase  airway protection.  Pt has been followed for several days with  increasing concerns for aspiration.  Has required a partial  nonrebreather.  Recent CXR showed worsened biltaeral basilar  predominant opacities.  Has been coughing with meals, due in part  to rushing rather than eating more slowly and allowing time for  respiration.  MBS recommended.   Type of Study: Modified Barium Swallowing Study Reason for Referral: Objectively evaluate swallowing function Previous Swallow Assessment: see HPI Diet Prior to this Study: Dysphagia 3 (soft);Nectar-thick liquids Temperature Spikes Noted: No Respiratory Status: non-rebreather History of Recent Intubation: No Behavior/Cognition: Alert;Cooperative Oral Cavity - Dentition: Adequate natural dentition Oral Motor / Sensory Function: Within functional limits Self-Feeding Abilities: Able to feed self Patient Positioning: Upright in chair Baseline Vocal Quality: Clear Volitional Cough: Strong Volitional Swallow: Able to elicit Anatomy: Within functional limits Pharyngeal Secretions: Not observed secondary MBS    Reason for Referral Objectively evaluate swallowing function   Oral Phase Oral Preparation/Oral Phase Oral Phase: WFL   Pharyngeal Phase Pharyngeal Phase Pharyngeal Phase: Impaired Pharyngeal - Thin Pharyngeal - Thin Cup: Delayed swallow  initiation;Penetration/Aspiration during swallow;Pharyngeal  residue - valleculae Penetration/Aspiration details (thin cup): Material enters  airway, remains ABOVE vocal cords and not ejected out Pharyngeal - Solids Pharyngeal - Puree: Premature spillage to valleculae;Pharyngeal  residue - valleculae Pharyngeal - Regular: Delayed swallow  initiation;Pharyngeal  residue - valleculae  Cervical Esophageal  Phase    Amanda L. Samson Frederic, [place] CCC/SLP Pager [phone]              Blenda Mounts Laurice 03/27/2014, 2:40 PM     Scheduled Meds: . amLODipine  10 mg Oral Daily  . antiseptic oral rinse  7 mL Mouth Rinse BID  . aspirin EC  81 mg Oral Daily  . dutasteride  0.5 mg Oral Daily  . enoxaparin (LOVENOX) injection  40 mg Subcutaneous Q24H  . feeding supplement (ENSURE COMPLETE)  237 mL Oral BID BM  . furosemide  60 mg Oral Daily  . guaiFENesin  600 mg Oral BID  . ipratropium-albuterol  3 mL Nebulization TID  . losartan  50 mg Oral Daily  . pantoprazole  40 mg Oral Daily  . potassium chloride  20 mEq Oral BID  . pravastatin  40 mg Oral Daily  . tamsulosin  0.4 mg Oral Daily   Continuous Infusions:   Principal Problem:   Acute on chronic respiratory failure with hypoxemia Active Problems:   COPD with emphysema Gold C    ILD (interstitial lung disease)   Pulmonary hypertension   Hypertension   Collagenous colitis   DNR (do not resuscitate)   Pneumonia   Accidental fall from bed   Acute encephalopathy   Palliative care patient    Time spent: 40 minutes   [facility]  [facility] Pager [phone]. If 7PM-7AM, please contact night-coverage at [email or web address], password [facility] 04/01/2014, 11:24 AM  LOS: 11 days

## 2014-04-02 DIAGNOSIS — R0600 Dyspnea, unspecified: Secondary | ICD-10-CM

## 2014-04-02 DIAGNOSIS — Z515 Encounter for palliative care: Secondary | ICD-10-CM

## 2014-04-02 MED ORDER — ALBUTEROL SULFATE (2.5 MG/3ML) 0.083% IN NEBU: 2.5000 mg | INHALATION_SOLUTION | RESPIRATORY_TRACT | Status: AC | PRN

## 2014-04-02 MED ORDER — ENSURE COMPLETE PO LIQD: 237.0000 mL | Freq: Two times a day (BID) | ORAL | Status: AC

## 2014-04-02 MED ORDER — AMLODIPINE BESYLATE 10 MG PO TABS: 10.0000 mg | ORAL_TABLET | Freq: Every day | ORAL | Status: DC

## 2014-04-02 MED ORDER — TRAMADOL-ACETAMINOPHEN 37.5-325 MG PO TABS: 1.0000 | ORAL_TABLET | Freq: Three times a day (TID) | ORAL | Status: DC | PRN

## 2014-04-02 MED ORDER — TRAZODONE HCL 50 MG PO TABS: 50.0000 mg | ORAL_TABLET | Freq: Every evening | ORAL | Status: AC | PRN

## 2014-04-02 MED ORDER — POTASSIUM CHLORIDE CRYS ER 20 MEQ PO TBCR: 20.0000 meq | EXTENDED_RELEASE_TABLET | Freq: Every day | ORAL | Status: DC

## 2014-04-02 MED ORDER — FUROSEMIDE 20 MG PO TABS: 60.0000 mg | ORAL_TABLET | Freq: Every day | ORAL | Status: DC

## 2014-04-02 MED ORDER — IPRATROPIUM-ALBUTEROL 0.5-2.5 (3) MG/3ML IN SOLN: 3.0000 mL | Freq: Three times a day (TID) | RESPIRATORY_TRACT | Status: DC

## 2014-04-02 MED ORDER — MORPHINE SULFATE (CONCENTRATE) 10 MG /0.5 ML PO SOLN: 5.0000 mg | ORAL | Status: DC | PRN

## 2014-04-02 MED ORDER — GUAIFENESIN ER 600 MG PO TB12: 600.0000 mg | ORAL_TABLET | Freq: Two times a day (BID) | ORAL | Status: DC

## 2014-04-02 NOTE — Progress Notes (Signed)
Patient ZO:XWRUE:Jim Contreras      DOB: 05-20-1924      AVW:[id number]  [facility] [place] will update daughter and have SW off choice so that hospice can evaluate for hospice facility eligibility.  Will need to update patient in the am on this and help him to understand his options.  Markelle Asaro L. Ladona Ridgelaylor, MD MBA The Palliative Medicine Team at [facility] Team Phone: [phone] Pager: [phone] ( Use team phone after hours)

## 2014-04-02 NOTE — Clinical Social Work Note (Signed)
CSW received referral from Palliative MD stating pt's [place] CSW spoke with pt's daughter regarding referral. Pt's daughter confirmed information above.  CSW has made appropriate referral to residential hospice facilities. CSW to continue to follow and assist with discharge planning needs.  Jim Contreras, LCSWA [phone]) Licensed Clinical Social Worker Orthopedics [phone]([id number]) and Surgical [phone]([id number])

## 2014-04-02 NOTE — Progress Notes (Signed)
Occupational Therapy Treatment Patient Details Name: Jim Contreras MRN: [id number] DOB: [birth year] Today's Date: 04/02/2014    History of present illness 80 y.o. male with Past medical history of hypertension, COPD with chronic resp failure on chronic o2 3LPM, CVA, interstitial lung disease, BPH, diastolic dysfunction, pulmonary hypertension.   Pt adm due to worsening of SOB. Pt with Acute on chronic respiratory failure with hypoxemia.    OT comments  Pt. Alert and pleasant, making jokes throughout session.  Performed light grooming tasks bed level with set up.  Focus on tx. Session is to incorporate energy conservation and breathing techniques to maintain optimum o2 levels during functional tasks.  Able to stay at 92% during grooming, but did drop to 74% with bed mobility rolling L/R, able to return to 93% after repositioned with HOB elevated.    Follow Up Recommendations  Other (comment) (dtr. meeting with pallative team today to discuss dispo for pt.)                 Precautions / Restrictions Precautions Precautions: Fall Precaution Comments: monitor O2 sats       Mobility Bed Mobility Overal bed mobility: Needs Assistance Bed Mobility: Rolling Rolling: Min assist         General bed mobility comments: desat to 74 during rolling L/R, returned to supine and pt. levels returned to 93%  Transfers                                                         ADL Overall ADL's : Needs assistance/impaired     Grooming: Oral care;Set up;Bed level;Wash/dry face       Lower Body Bathing: Moderate assistance;Bed level Lower Body Bathing Details (indicate cue type and reason): able to wash fron peri-area with set up bed level, max a for back peri area rolling L/R for linen change                       General ADL Comments: remained bed level today for grooming tasks.  pt. on 65% FiO2, averaging 92-96% during rest/no activity and 76% when  rolling L/R, repositioned and pt. returned to 93%.  pt. was incontinent of bowel/bladder and required assistance for clean up.  states "i dont have accidents" continued to make jokes throughout session, compensation for deficits                                                                                              General Comments  pt. Jokes throughout session, question personality or also compensation for any deficits or inability to process situation    Pertinent Vitals/ Pain       Pain Assessment: No/denies pain  Frequency Min 2X/week     Progress Toward Goals  OT Goals(current goals can now be found in the care plan section)  Progress towards OT goals: Progressing toward goals     Plan Discharge plan needs to be updated                     End of Session Equipment Utilized During Treatment: Oxygen   Activity Tolerance Other (comment) (limited with fluctuating o2 levels)   Patient Left in bed;with call bell/phone within reach;with family/visitor present   Nurse Communication Other (comment) (B/B status, barrier cream preference for pt.)        Time: [id number] OT Time Calculation (min): 32 min  Charges: OT General Charges $OT Visit: 1 Procedure OT Treatments $Self Care/Home Management : 23-37 mins  Robet LeuMorris, Clearnce Leja Lorraine, COTA/L 04/02/2014, 10:37 AM

## 2014-04-02 NOTE — Progress Notes (Signed)
Patient GN:FAOZH:Jim Contreras      DOB: Oct 25, 1923      YQM:[id number]   Palliative Medicine Team at [facility] Progress Note    Subjective:  Patient does not have full conceptualization that he will not return to functioning at previous level and definitely does not have sense that this illness will take his life soon.  His daughter and I started to talk with him about this new concept.  Daughter with family support are ok with involving hospice at ALF or Hospice facility depending on who is willing to accommodate his oxygen needs.  Currently, on HFNC oxygen.  Plan to continue to talk with him today and introduce the topic of hospice care so that we can have his case evaluated.  Presently , patient desats with just turning in the bed and has a look of fright per his daughter.      Filed Vitals:   04/02/14 0900  BP:   Pulse: 60  Temp:   Resp: 18   Physical exam:  General: brighter with better concentration today. Still falls off to sleep easily but able to maintain full conversation for half of an hour.  1.  DNR   2.  Continue supportive treatments for dyspnea   3.  Will talk with social worker to see what options are available at [facility] vs if patient will permit hospice facility evaluation.    Total time :  1030 am- 1130 am   Stuti Sandin L. Ladona Ridgelaylor, MD MBA The Palliative Medicine Team at [facility] Team Phone: [phone] Pager: [phone] ( Use team phone after hours)             Assessment and plan:

## 2014-04-02 NOTE — Discharge Summary (Signed)
Physician Discharge Summary  Jim QuintJames R Kealey MRN: [id number] DOB/AGE: [birth year] 80 y.o.  PCP: Laurell JosephsMorrow, Aaron P, MD   Admit date: 03/21/2014 Discharge date: 04/02/2014  Discharge Diagnoses:      Acute on chronic respiratory failure with hypoxemia   COPD with emphysema Gold C    ILD (interstitial lung disease)   Pulmonary hypertension   Hypertension   Collagenous colitis   DNR (do not resuscitate)   Pneumonia   Accidental fall from bed   Acute encephalopathy   Palliative care patient   Follow up recommendations  Dr. Delford FieldWright will  be Hospice MD if needed     Medication List         albuterol (2.5 MG/3ML) 0.083% nebulizer solution  Commonly known as:  PROVENTIL  Take 3 mLs (2.5 mg total) by nebulization every 4 (four) hours as needed for wheezing or shortness of breath.     amLODipine 10 MG tablet  Commonly known as:  NORVASC  Take 1 tablet (10 mg total) by mouth daily.     aspirin 81 MG tablet  Take 81 mg by mouth daily.     dutasteride 0.5 MG capsule  Commonly known as:  AVODART  Take 0.5 mg by mouth daily.     feeding supplement (ENSURE COMPLETE) Liqd  Take 237 mLs by mouth 2 (two) times daily between meals.     furosemide 20 MG tablet  Commonly known as:  LASIX  Take 3 tablets (60 mg total) by mouth daily.     guaiFENesin 600 MG 12 hr tablet  Commonly known as:  MUCINEX  Take 1 tablet (600 mg total) by mouth 2 (two) times daily.     ipratropium-albuterol 0.5-2.5 (3) MG/3ML Soln  Commonly known as:  DUONEB  Take 3 mLs by nebulization 3 (three) times daily.     lansoprazole 30 MG capsule  Commonly known as:  PREVACID  Take 30 mg by mouth daily at 12 noon.     losartan 50 MG tablet  Commonly known as:  COZAAR  Take 50 mg by mouth daily.     morphine CONCENTRATE 10 mg / 0.5 ml concentrated solution  Take 0.25 mLs (5 mg total) by mouth every 4 (four) hours as needed for severe pain or shortness of breath.     potassium chloride SA 20 MEQ tablet   Commonly known as:  K-DUR,KLOR-CON  Take 1 tablet (20 mEq total) by mouth daily.     pravastatin 40 MG tablet  Commonly known as:  PRAVACHOL  Take 40 mg by mouth daily.     tamsulosin 0.4 MG Caps capsule  Commonly known as:  FLOMAX  Take 0.4 mg by mouth daily.     tiotropium 18 MCG inhalation capsule  Commonly known as:  SPIRIVA HANDIHALER  Place 1 capsule (18 mcg total) into inhaler and inhale daily.     traMADol-acetaminophen 37.5-325 MG per tablet  Commonly known as:  ULTRACET  Take 1 tablet by mouth every 8 (eight) hours as needed for moderate pain or severe pain.     traZODone 50 MG tablet  Commonly known as:  DESYREL  Take 1 tablet (50 mg total) by mouth at bedtime as needed for sleep.        Discharge Condition:   Disposition: 04-Intermediate Care Facility   Consults Pulmonary Palliative care   Significant Diagnostic Studies: Ct Head Wo Contrast  03/21/2014   CLINICAL DATA:  Altered mental status after fall.  EXAM: CT HEAD  WITHOUT CONTRAST  TECHNIQUE: Contiguous axial images were obtained from the base of the skull through the vertex without intravenous contrast.  COMPARISON:  CT scan of December 31, 2012.  FINDINGS: Bony calvarium appears intact. Diffuse cortical atrophy is noted. Mild chronic ischemic white matter disease is noted. No mass effect or midline shift is noted. Ventricular size is within normal limits. There is no evidence of mass lesion, hemorrhage or acute infarction.  IMPRESSION: Diffuse cortical atrophy. Mild chronic ischemic white matter disease. No acute intracranial abnormality seen.   Electronically Signed   By: Roque Lias M.D.   On: 03/21/2014 18:26   Ct Angio Chest Pe W/cm &/or Wo Cm  03/26/2014   CLINICAL DATA:  89 -year-old male with acute shortness of breath. Intermediate V/Q scan. Initial encounter.  EXAM: CT ANGIOGRAPHY CHEST WITH CONTRAST  TECHNIQUE: Multidetector CT imaging of the chest was performed using the standard protocol  during bolus administration of intravenous contrast. Multiplanar CT image reconstructions and MIPs were obtained to evaluate the vascular anatomy.  CONTRAST:  62mL OMNIPAQUE IOHEXOL 350 MG/ML SOLN  COMPARISON:  High-resolution Chest CT 05/01/2013. Portable chest x-ray 03/24/2014. V/Q scan 03/25/2014.  FINDINGS: Good contrast bolus timing in the pulmonary arterial tree. Respiratory motion artifact at both lung bases. No focal filling defect identified in the pulmonary arterial tree to suggest the presence of acute pulmonary embolism.  Chronic emphysema. Mildly lower lung volumes compared to 2014. Central airways are patent except for a atelectatic changes. Small bilateral layering pleural effusions. Combination of compressive lower lobe atelectasis and also some patchy right lower lobe pulmonary opacity. In the peripheral right lower lobe there is hyperdense opacity (series 4, image 73 which might be previously aspirated oral contrast, uncertain. However, this was also present to a degree in 2014 and at that time alveolar or microlithiasis was considered. Chronic peripheral increased reticular opacity in the mid lungs is stable.  No pericardial effusion. Calcified coronary artery and aortic atherosclerosis. Stable small right peritracheal and mediastinal lymph nodes. No axillary lymphadenopathy. Negative thoracic inlet.  Negative visualized liver, spleen, pancreas, adrenal glands, kidneys. New moderate-sized gastric hiatal hernia.  Chronic left costovertebral angle rib fractures (e.g. Left seventh rib series 6, image 38). Osteopenia. Mild T12 compression fracture appears nonacute but increased since 2014. No acute osseous abnormality identified.  Review of the MIP images confirms the above findings.  IMPRESSION: 1.  No evidence of acute pulmonary embolus. 2. Chronic emphysema and interstitial lung disease with superimposed small pleural effusions and acute right lower lobe opacity suspicious for pneumonia. 3.  Superimposed chronic and progressed right lower lobe hyperdense material with a diagnosis of alveolar microlithiasis considered on the prior study of 2014. 4. Chronic but progressed T12 compression fracture.   Electronically Signed   By: Augusto Gamble M.D.   On: 03/26/2014 13:28   Nm Pulmonary Perf And Vent  03/25/2014   CLINICAL DATA:  Acute on chronic respiratory failure. Hypoxemia. COPD. Emphysema. Encephalopathy.  EXAM: NUCLEAR MEDICINE VENTILATION - PERFUSION LUNG SCAN  TECHNIQUE: Ventilation images were obtained in multiple projections using inhaled aerosol technetium 99 M DTPA. Perfusion images were obtained in multiple projections after intravenous injection of Tc-74m MAA.  RADIOPHARMACEUTICALS:  6.0 mCi Tc-105m DTPA aerosol and 40 mCi Tc-67m MAA  COMPARISON:  03/24/2014  FINDINGS: Ventilation: Abnormal accumulation of radiotracer medially near the hilum in both lungs. Hypoventilation at the right lung base and in the lingula. Hypoventilation in the lung apices.  Perfusion: The patient was apparently unable to raise  the right arm, leading to a photopenic defect on the lateral projection. Patchy moderate-sized perfusion defect posteriorly in the right lung corresponding to radiographic and ventilation abnormalities. Patchy hypoperfusion in both upper lobes.  IMPRESSION: 1. Intermediate probability of pulmonary embolus (risk 20-79%) due to triple matched large segmental defects in the lower lung zone and multiple moderate to large matched the defects in the upper lungs. There is known airspace opacity in the lung bases as well as severe emphysema especially affecting the upper lobes.   Electronically Signed   By: Herbie BaltimoreWalt  Liebkemann M.D.   On: 03/25/2014 13:22   Dg Chest Port 1 View  03/24/2014   CLINICAL DATA:  Shortness of Breath.  COPD.  Smoker.  A a seen  EXAM: PORTABLE CHEST - 1 VIEW  COMPARISON:  03/21/2014  FINDINGS: Continued left lower lobe airspace disease is similar prior study. In addition, there  is increasing right basilar airspace opacity. Findings concerning for multifocal pneumonia.  Underlying COPD/hyperinflation. Heart is borderline in size. No effusions or acute bony abnormality.  IMPRESSION: Stable left lower lobe airspace disease with worsening right basilar airspace opacity. Findings concerning for worsening multifocal pneumonia.   Electronically Signed   By: Charlett NoseKevin  Dover M.D.   On: 03/24/2014 15:17   Dg Chest Port 1 View  03/21/2014   CLINICAL DATA:  Confusion fever.  Hypertension.  COPD.  Cough.  EXAM: PORTABLE CHEST - 1 VIEW  COMPARISON:  12/31/2012.  03/09/2012  FINDINGS: Midline trachea. Mild cardiomegaly with atherosclerosis in the transverse aorta. No right and no definite left pleural effusion. Lower lobe predominant interstitial thickening is moderate. Right upper lobe scarring. More confluent left lower lobe patchy airspace disease.  IMPRESSION: Left lower lobe airspace disease, suspicious for infection or aspiration. Progression of interstitial lung disease is felt less likely.  Interstitial lung disease as was detailed on 05/01/2013 high-resolution CT. Considerations include alveolar microlithiasis superimposed upon nonspecific interstitial pneumonitis.  Cardiomegaly without congestive failure.   Electronically Signed   By: Jeronimo GreavesKyle  Talbot M.D.   On: 03/21/2014 17:58   Dg Swallowing Func-speech Pathology  03/27/2014   Carolan ShiverAmanda Laurice Couture, CCC-SLP     03/27/2014  2:45 PM Objective Swallowing Evaluation: Modified Barium Swallowing Study   Patient Details  Name: Jim Contreras MRN: [id number] Date of Birth: 05/16/1924  Today's Date: 03/27/2014 Time: [id number] SLP Time Calculation (min): 25 min  Past Medical History:  Past Medical History  Diagnosis Date  . Hypertension   . Hypercholesteremia   . BPH (benign prostatic hyperplasia)   . ILD (interstitial lung disease)   . COPD, moderate   . Stroke     Thalamic   Past Surgical History:  Past Surgical History  Procedure Laterality Date  .  Cataract extraction Bilateral   . Appendectomy    . Hernia repair    . Flexible sigmoidoscopy N/A 04/24/2013    Procedure: FLEXIBLE SIGMOIDOSCOPY;  Surgeon: Shirley FriarVincent C.  Schooler, MD;  [facility];  Service: Endoscopy;   Laterality: N/A;   HPI:  80 yo male admitted for SOB, productive cough, and confusion with  LLL airspace disease per CXR. PMH includes COPD, ILD, GERD,  hypertension. Pt had MBS in 2013 with deep, silent penetration to  the vocal cords with large sips of thin liquid. SLP recommended a  regular diet and thin liquids with small, single sips to increase  airway protection.  Pt has been followed for several days with  increasing concerns for aspiration.  Has required a partial  nonrebreather.  Recent CXR showed worsened biltaeral basilar  predominant opacities.  Has been coughing with meals, due in part  to rushing rather than eating more slowly and allowing time for  respiration.  MBS recommended.       Assessment / Plan / Recommendation Clinical Impression  Dysphagia Diagnosis: Mild pharyngeal phase dysphagia  Clinical impression: Pt's swallow abilities appeared to be quite  functional during MBS.  Demonstrated only occasional penetration  of thin liquids, but it was not observed to reach the level of  the vocal cords.  There was mild pharyngeal residue post-swallow.   No aspiration. During assessment, pt's pace of consumption was  controlled by examiner.  When pt is functionally eating/drinking,   he has been observed to do so at rapid rates, leading to  clinical signs of aspiration.    Recommend regular diet, thin liquids but strict precautions to  minimize potential for aspiration:  SLOW RATE; REPLACE NRB BETWEEN BOLUSES OF FOOD/LIQUID AND ALLOW  5-6 BREATHS BEFORE RESUMING EATING.    Treatment Recommendation    Continue per original plan   Diet Recommendation Regular;Thin liquid   Liquid Administration via: Cup;Straw Medication Administration: Whole meds with puree Supervision: Patient  able to self feed;Intermittent supervision  to cue for compensatory strategies Compensations: Slow rate;Small sips/bites (breathe 5-6 times  between swallows) Postural Changes and/or Swallow Maneuvers: Seated upright 90  degrees    Other  Recommendations Oral Care Recommendations: Oral care BID   Follow Up Recommendations   (tba)                SLP Swallow Goals     General Date of Onset: 03/27/14 HPI: 80 yo male admitted for SOB, productive cough, and confusion  with LLL airspace disease per CXR. PMH includes COPD, ILD, GERD,  hypertension. Pt had MBS in 2013 with deep, silent penetration to  the vocal cords with large sips of thin liquid. SLP recommended a  regular diet and thin liquids with small, single sips to increase  airway protection.  Pt has been followed for several days with  increasing concerns for aspiration.  Has required a partial  nonrebreather.  Recent CXR showed worsened biltaeral basilar  predominant opacities.  Has been coughing with meals, due in part  to rushing rather than eating more slowly and allowing time for  respiration.  MBS recommended.   Type of Study: Modified Barium Swallowing Study Reason for Referral: Objectively evaluate swallowing function Previous Swallow Assessment: see HPI Diet Prior to this Study: Dysphagia 3 (soft);Nectar-thick liquids Temperature Spikes Noted: No Respiratory Status: non-rebreather History of Recent Intubation: No Behavior/Cognition: Alert;Cooperative Oral Cavity - Dentition: Adequate natural dentition Oral Motor / Sensory Function: Within functional limits Self-Feeding Abilities: Able to feed self Patient Positioning: Upright in chair Baseline Vocal Quality: Clear Volitional Cough: Strong Volitional Swallow: Able to elicit Anatomy: Within functional limits Pharyngeal Secretions: Not observed secondary MBS    Reason for Referral Objectively evaluate swallowing function   Oral Phase Oral Preparation/Oral Phase Oral Phase: WFL   Pharyngeal Phase Pharyngeal  Phase Pharyngeal Phase: Impaired Pharyngeal - Thin Pharyngeal - Thin Cup: Delayed swallow  initiation;Penetration/Aspiration during swallow;Pharyngeal  residue - valleculae Penetration/Aspiration details (thin cup): Material enters  airway, remains ABOVE vocal cords and not ejected out Pharyngeal - Solids Pharyngeal - Puree: Premature spillage to valleculae;Pharyngeal  residue - valleculae Pharyngeal - Regular: Delayed swallow initiation;Pharyngeal  residue - valleculae  Cervical Esophageal Phase    Amanda L. Samson Frederic, [place] CCC/SLP Pager [phone]  Blenda Mounts Laurice 03/27/2014, 2:40 PM       Microbiology: Recent Results (from the past 240 hour(s))  URINE CULTURE     Status: None   Collection Time    03/23/14  9:00 PM      Result Value Ref Range Status   Specimen Description URINE, CLEAN CATCH   Final   Special Requests NONE   Final   Culture  Setup Time     Final   Value: 03/23/2014 21:40     Performed at [facility] Count     Final   Value: NO GROWTH     Performed at [facility]   Culture     Final   Value: NO GROWTH     Performed at [facility]   Report Status 03/25/2014 FINAL   Final     Labs: No results found for this or any previous visit (from the past 48 hour(s)).   HPI :34 M with COPD/ILD on chronic O2,   who presented 10/8 with SOB likely 2/2 COPD exacerbation secondary to HCAP from ALF    HOSPITAL COURSE:  COPD Gold Stage C with exacerbation Acute on chronic hypoxemic respiratory failure secondary to left lower lobe pneumonia-not improving  Healthcare associated pneumonia -LLP Interstitial lung disease with pulmonary hypertension  Treated with broad spectrum abx initially Discontinued Azithromycin, Azactam, and Vancomycin on 10/11  10/11-10/15  Switched to Imipenem 250 mg IV q6h ,  10/15-Started Rocephin and azithromycin , completing antibiotics for a total of  8 days  Completed steroids, started on diuresis with  lasix due to hypoxemia which was not improving ,currently on Lasix to 60 mg a day  Continue chest percussion therapy, out of bed to chair ,  incentive spirometry -not helping  Speech therapy evaluation recommends regular diet and thin liquids  VQ scan showed intermediate probability for PE , therefore did CT angiography which was negative for PE  Venous Doppler of the bilateral lower extremities negative for DVT  Now on  oximiser, titrate HFO for sats 82-85%  Acceptable SpO2 of > 82 %. , Currently on high flow nasal cannula, and 50% FiO2  We can get pt to [facility] place on 8-10L flow or home with two 10L concentrators together  Palliative care consult  , hospice eligible  Okay to give morphine for dyspnea overnight  Probable discharge  To ALF with hospice vs hospice home    Hypokalemia repleted  Chronic stable Diastolic heart failure  Continue Lasix to 60 mg a day  2-D echo , showed diastolic heart failure EF of 65-70%, discussed with the patient's daughter  Follow BMP   Hypertension  Continue Norvasc, Lasix   BPH  Continue Avodart and Flomax   GERD  continue Protonix   Acute encephalopathy  Fall from bed prior to admission-follow mental status  - PT eval recommended SNF  Fall precautions   Discharge Exam:  Blood pressure 109/50, pulse 60, temperature 97.6 F (36.4 C), temperature source Axillary, resp. rate 18, height 5\' 7"  (1.702 m), weight 69.8 kg (153 lb 14.1 oz), SpO2 93.00%. General: Pleasant, elderly male in NAD on high flow O2 but in no distress, speaking full sentences and working with occupational therapy.  Neuro: No focal deficits. MAE's.  HEENT: [place]/AT, PERRL, MMM.  Neck: Trachea supple and midline, (-) LAN or JVD  Cardiovascular: RRR, no MRG  Lungs: resps even, non labored on high flow , Distant with faint rales in b/l bases  Abdomen: S/NT/ND/(+)BS  Musculoskeletal: No deformities, no edema.  Skin: Grossly Intact           Follow-up Information    Follow up with Shan Levans, MD. Schedule an appointment as soon as possible for a visit in 1 week.   Specialty:  Pulmonary Disease   Contact information:   [address] [place] [address] [phone]       Signed: Richarda Overlie 04/02/2014, 2:26 PM

## 2014-04-02 NOTE — Progress Notes (Signed)
PULMONARY  / CRITICAL CARE MEDICINE CONSULTATION   Name: Jim Contreras MRN: [id number] DOB: Jun 27, 1923    ADMISSION DATE:  03/21/2014 CONSULTATION DATE: 03/24/2014  REQUESTING CLINICIAN: Dr. Susie CassetteAbrol PRIMARY SERVICE: TRH  CHIEF COMPLAINT:  SOB  BRIEF PATIENT DESCRIPTION: [age] M with COPD/ILD on chronic O2, patient of PW, who presented 10/8 with SOB likely 2/2 COPD exacerbation.  SIGNIFICANT EVENTS / STUDIES:  10/08 ABG > 7.491/28.6/51.0 10/11 Chest X-ray > with worsening bibasilar opacities concerning for pneumonia 10/15 On SDU, requiring increased O2, Partial NRB 10/19 Palliative care consult 10/20 Family meeting planned with Dr. Ladona Ridgelaylor / palliative care  LINES / TUBES: PIV  CULTURES:  Blood Cultures 10/8 >> Negative Urine Culture 10/8 >> Negative Flu Panel 10/8 >> Negative   SUBJECTIVE:  Remains on HFNC @ 63%, 50L/min.  Denies chest pains, SOB.  No acute events overnight.   VITAL SIGNS: Temp:  [97.6 F (36.4 C)-98.2 F (36.8 C)] 97.6 F (36.4 C) (10/20 0629) Pulse Rate:  [52-69] 60 (10/20 0900) Resp:  [17-19] 18 (10/20 0900) BP: (100-117)/(40-50) 109/50 mmHg (10/20 0629) SpO2:  [88 %-96 %] 93 % (10/20 0900) FiO2 (%):  [50 %-63.6 %] 63.6 % (10/20 0900) Weight:  [69.8 kg (153 lb 14.1 oz)] 69.8 kg (153 lb 14.1 oz) (10/20 0637)   INTAKE / OUTPUT: Intake/Output     10/19 0701 - 10/20 0700 10/20 0701 - 10/21 0700   P.O. 360    Total Intake(mL/kg) 360 (5.2)    Urine (mL/kg/hr) 75 (0)    Total Output 75     Net +285          Urine Occurrence 4 x    Stool Occurrence 3 x      PHYSICAL EXAMINATION: General:  Pleasant, elderly male in NAD on high flow O2 but in no distress, speaking full sentences and working with occupational therapy. Neuro:  No focal deficits. MAE's. HEENT:  [place]/AT, PERRL, MMM. Neck: Trachea supple and midline, (-) LAN or JVD Cardiovascular:  RRR, no MRG Lungs:  resps even, non labored on high flow Odem, Distant with faint rales in b/l  bases Abdomen:  S/NT/ND/(+)BS Musculoskeletal:  No deformities, no edema. Skin:  Grossly Intact  LABS:  CBC  Recent Labs Lab 03/27/14 0332  WBC 9.9  HGB 13.4  HCT 42.0  PLT 204   BMET  Recent Labs Lab 03/27/14 0332 03/29/14 0252 03/31/14 0329  NA 140 139 137  K 4.3 4.2 4.7  CL 102 99 98  CO2 28 29 28   BUN 31* 32* 35*  CREATININE 0.92 1.27 1.35  GLUCOSE 110* 105* 109*   Electrolytes  Recent Labs Lab 03/27/14 0332 03/29/14 0252 03/31/14 0329  CALCIUM 8.3* 8.3* 8.8   Sepsis Markers No results found for this basename: LATICACIDVEN, PROCALCITON, O2SATVEN,  in the last 168 hours ABG No results found for this basename: PHART, PCO2ART, PO2ART,  in the last 168 hours Liver Enzymes  Recent Labs Lab 03/27/14 0332 03/29/14 0252  AST 18 14  ALT 20 14  ALKPHOS 43 49  BILITOT 0.3 0.4  ALBUMIN 2.6* 2.6*   Cardiac Enzymes No results found for this basename: TROPONINI, PROBNP,  in the last 168 hours Glucose  Recent Labs Lab 03/27/14 1118  GLUCAP 130*    Imaging No results found.  CXR: no new film   ASSESSMENT / PLAN:  Acute on Chronic Hypoxic Respiratory Failure: With elevated WBC count on presentation and progressive bibasilar infiltrates. Pneumonia can also explain the increased falls  noted recently. No active wheeze.  ILD with possible flare, prob chronic aspiration  Severe COPD / emphysema endstage Pulm HTN  Plan: DNR / DNI Status Continue Duonebs and PRN albuterol Aggressive pulm hygiene as able  Dose lasix daily as renal fxn / BP permit.  Titrate HFO for sats 82-85% Will accept SpO2 of > 80 %, goal by an oximizer  Palliative care following.  Goal is titration of O2 down to 8-10L for [facility] v home with hospice (with two 10L concentrators together).  PRN morphine for dyspnea  Roxanol upon discharge per palliative care. Dr. Delford FieldWright can be Hospice MD if needed Palliative care meeting with pt and daughter this AM 10/20 to discuss further  plans   Rutherford Guysahul Desai, [place] - [facility] Pgr: [phone]  or [phone] 04/02/2014, 10:21 AM   Levy Pupaobert Anita Mcadory, MD, PhD 04/02/2014, 12:09 PM [place] [facility] [phone] or if no answer [phone]

## 2014-04-03 MED ORDER — IPRATROPIUM-ALBUTEROL 0.5-2.5 (3) MG/3ML IN SOLN
3.0000 mL | Freq: Two times a day (BID) | RESPIRATORY_TRACT | Status: DC
  Administered 2014-04-04 – 2014-04-05 (×3): 3 mL via RESPIRATORY_TRACT
  Filled 2014-04-03 (×3): qty 3

## 2014-04-03 MED ORDER — MORPHINE SULFATE (CONCENTRATE) 10 MG /0.5 ML PO SOLN: 5.0000 mg | ORAL | Status: DC | PRN

## 2014-04-03 MED ORDER — LORAZEPAM 2 MG/ML PO CONC: 1.0000 mg | Freq: Four times a day (QID) | ORAL | Status: DC | PRN

## 2014-04-03 MED ORDER — TRAMADOL-ACETAMINOPHEN 37.5-325 MG PO TABS: 1.0000 | ORAL_TABLET | Freq: Three times a day (TID) | ORAL | Status: DC | PRN

## 2014-04-03 NOTE — Progress Notes (Signed)
Patient to be discharged on oximizer.  MD wanted to try for patient comfort prior to discharge.  Placed patient on 10l/m on 2 separate flow meters, for a total of 20 l/m, wyed together into the oximizer.  Patient tolerating well.  Stated that he was more comfortable on this than the HF [place].  Left on 20l oximizer.  RN and case manager aware.  Will continue to monitor.

## 2014-04-03 NOTE — Consult Note (Signed)
Jim Contreras Liaison: Received request from [place] for family interest in [facility]. Appreciate report and updates from Dr. Lovena Le. Spoke with daughter Jim Contreras by phone to answer questions and confirms goals. Met with patient and daughter Jim Contreras around noon to continue to answer questions and confirm desire to transfer once oxygen needs determined. Followed up again after RT switched to oximizer. [facility] room is available for Jim Contreras tomorrow. Daughter and CSW aware. Plan to meet with patient and Jim Contreras in am to complete registration visit. Thank you. Erling Conte LCSW [phone]

## 2014-04-03 NOTE — Progress Notes (Addendum)
Patient Jim Contreras      DOB: [birth year]      YJE:[id number]   Palliative Medicine Team at [facility] Progress Note    Subjective: Met with patient this am.  He was slightly taken aback that he may not be able to go back to his ALF setting.  He has significant subtle short term memory deficits which he hides well and so I do not believe that he remembers what we talked about yesterday.  He back irritable when we started to talk about involving hospice.  At first he said that he knew the word hospice but then he became defensive.  I stepped out to let him finish his breakfast in peace and talk with his daughter.  His daughter reexplained his oxygen issues and told him that he might just need to trust her judgement on using hospice.  He stated he would do just that. He needs to feel he has control but when given control he struggles to hold it together.  Jim Contreras ok with preceding to have hospice come by.  He asked if I had a financial stake in hospice. I told him no, that the only stake that I have is to find ways for him to enjoy the time that he has with his family in the most comfortable environment.  I do not believe that he fully understands that he is at the end of his life, so further conversations may need to take place as time goes on.  He might never reconcile that issue due to his memory deficits.     Filed Vitals:   04/03/14 0620  BP: 116/55  Pulse: 57  Temp: 98.3 F (36.8 C)  Resp: 19   Physical exam:   Exam defered   Assessment and plan: 80 yr old with advanced COPD and ILD admitted with respiratory failure.  He is essential bed bound at this time.  He may not fully grasp that he is on an EOL journey but his family does.  He is willing to let Hospice become involved  1.  DNR  2.  Hospice has been consulted and updated . They will get in touch with his daughter and he later today  3. Respiratory failure: I agree with trying to transition to the oxymizer.   Total  time: 9-930 and 1030 -1045 Greater than 50%  of this time was spent counseling and coordinating care related to the above assessment and plan.  I will be away at a Meeting the rest of the week but my team will shadow his disposition and reengage if matters necessitate. Don't hesitate to call the PMT phone if you need personal assistance.   Jim Contreras L. Lovena Le, MD MBA The Palliative Medicine Team at [facility] Phone: [phone] Pager: [phone] ( Use team phone after hours)

## 2014-04-03 NOTE — Care Management (Signed)
Referral for :  [facility] to the hospital rm, try 2 tanks of 10Lit/min each, to make sure patient can tolerate this treatment here before discharge.  [facility] aware. Ronny FlurryHeather Bentlie Catanzaro RN BSN [phone]

## 2014-04-03 NOTE — Progress Notes (Signed)
Physical Therapy Discharge Patient Details Name: Jim QuintJames R Pestka MRN: [id number] DOB: [birth year] Today's Date: 04/03/2014 Time:  -     Patient discharged from PT services secondary to pt's family requested that therapy sign off at this time as it is not their current goal and is increasing pt agitation per daughter. Daughter also reports that pt should be discharging to SNF soon..  Please see latest therapy progress note for current level of functioning and progress toward goals, 03/28/14  Progress and discharge plan discussed with patient and/or caregiver: Patient/Caregiver agrees with plan  GP     Diamond Jentz, [place] 04/03/2014, 1:48 PM

## 2014-04-03 NOTE — Progress Notes (Signed)
Patient Demographics  Jim Contreras, is a 80 y.o. male, DOB - 26-May-1924, ZOX:[id number]  Admit date - 03/21/2014   Admitting Physician [name], MD  Outpatient Primary MD for the patient is [name], MD  LOS - 13   Chief Complaint  Patient presents with  . Fever        Subjective:   Jim Contreras today has, No headache, No chest pain, No abdominal pain - No Nausea, No new weakness tingling or numbness, No Cough - SOB.    Assessment & Plan     Acute on chronic hypoxemic respiratory failure secondary to left lower lobe pneumonia-not improving  Healthcare associated pneumonia  COPD exacerbation  Interstitial lung disease with pulmonary hypertension   Critical care recommends to maintain SpO2 of > 80%. Obtain oximizer and uptitrate his O2 , Currently on 50 lit/min high flow.  Discontinued Azithromycin, Azactam, and Vancomycin on 10/11, 10/11-10/15 on Imipenem 250 mg IV q6h , 10/15-Started Rocephin and azithromycin ,completing antibiotics . 8 days    Completed steroids, continue Lasix to 60 mg a day, continue chest percussion therapy, out of bed to chair , start patient on incentive spirometry.   Speech therapy evaluation recommends regular diet and thin liquids.   VQ scan intermediate probability, therefore did CT angiography which was negative for PE   Venous Doppler of the bilateral lower extremities negative for DVT    Discussed with palliative care and pulmonary. Also discuss with patient's daughter. He is still not decided on hospice. Disposition pending. Trying to get home health agency to deliver oximeter bedside.      Hypokalemia replete , follow BMP q. 48 hours     Chronic stable Diastolic heart failure  Continue Lasix to 60 mg a day  2-D echo , showed  diastolic heart failure EF of 65-70%, discussed with the patient's daughter  Follow BMP      Hypertension  Continue Norvasc, Lasix      BPH  Continue Avodart and Flomax     GERD  continue Protonix     Acute encephalopathy  Fall from bed prior to admission-follow mental status , PT eval recommended SNF      Code Status:  DNR  Family Communication: daughter  Disposition Plan: TBD   Procedures    Consults PCCM, Pall Care   Medications  Scheduled Meds: . amLODipine  10 mg Oral Daily  . antiseptic oral rinse  7 mL Mouth Rinse BID  . aspirin EC  81 mg Oral Daily  . dutasteride  0.5 mg Oral Daily  . enoxaparin (LOVENOX) injection  40 mg Subcutaneous Q24H  . feeding supplement (ENSURE COMPLETE)  237 mL Oral BID BM  . furosemide  60 mg Oral Daily  . guaiFENesin  600 mg Oral BID  . ipratropium-albuterol  3 mL Nebulization TID  . losartan  50 mg Oral Daily  . pantoprazole  40 mg Oral Daily  . potassium chloride  20 mEq Oral BID  . pravastatin  40 mg Oral Daily  . tamsulosin  0.4 mg Oral Daily   Continuous Infusions:  PRN Meds:.albuterol, hydrALAZINE, menthol-cetylpyridinium, traMADol-acetaminophen, traZODone  DVT Prophylaxis  Lovenox   Lab Results  Component Value Date   PLT 204 03/27/2014  Antibiotics     Anti-infectives   Start     Dose/Rate Route Frequency Ordered Stop   03/28/14 1300  cefTRIAXone (ROCEPHIN) 1 g in dextrose 5 % 50 mL IVPB  Status:  Discontinued     1 g 100 mL/hr over 30 Minutes Intravenous Every 24 hours 03/28/14 1151 03/29/14 1047   03/28/14 1300  azithromycin (ZITHROMAX) 500 mg in dextrose 5 % 250 mL IVPB  Status:  Discontinued     500 mg 250 mL/hr over 60 Minutes Intravenous Every 24 hours 03/28/14 1210 03/29/14 1047   03/24/14 1430  imipenem-cilastatin (PRIMAXIN) 250 mg in sodium chloride 0.9 % 100 mL IVPB  Status:  Discontinued     250 mg 200 mL/hr over 30 Minutes Intravenous 4 times per day 03/24/14 1423 03/28/14 1107     03/23/14 1700  azithromycin (ZITHROMAX) tablet 500 mg  Status:  Discontinued     500 mg Oral Daily 03/23/14 1533 03/24/14 1413   03/22/14 2000  vancomycin (VANCOCIN) IVPB 1000 mg/200 mL premix  Status:  Discontinued     1,000 mg 200 mL/hr over 60 Minutes Intravenous Every 24 hours 03/21/14 1840 03/24/14 1413   03/22/14 1700  azithromycin (ZITHROMAX) 500 mg in dextrose 5 % 250 mL IVPB  Status:  Discontinued     500 mg 250 mL/hr over 60 Minutes Intravenous Every 24 hours 03/22/14 1630 03/23/14 1532   03/22/14 0200  aztreonam (AZACTAM) 1 g in dextrose 5 % 50 mL IVPB  Status:  Discontinued     1 g 100 mL/hr over 30 Minutes Intravenous 3 times per day 03/21/14 1840 03/24/14 1413   03/21/14 1730  aztreonam (AZACTAM) 2 g in dextrose 5 % 50 mL IVPB     2 g 100 mL/hr over 30 Minutes Intravenous  Once 03/21/14 1718 03/21/14 1847   03/21/14 1730  vancomycin (VANCOCIN) IVPB 1000 mg/200 mL premix     1,000 mg 200 mL/hr over 60 Minutes Intravenous  Once 03/21/14 1718 03/21/14 2018          Objective:   Filed Vitals:   04/03/14 0620 04/03/14 0855 04/03/14 1344 04/03/14 1510  BP: 116/55   110/47  Pulse: 57   62  Temp: 98.3 F (36.8 C)   98 F (36.7 C)  TempSrc: Oral   Oral  Resp: 19   16  Height:      Weight: 69.4 kg (153 lb)     SpO2: 92% 93% 97% 96%    Wt Readings from Last 3 Encounters:  04/03/14 69.4 kg (153 lb)  11/28/13 67.586 kg (149 lb)  04/26/13 63.1 kg (139 lb 1.8 oz)     Intake/Output Summary (Last 24 hours) at 04/03/14 1600 Last data filed at 04/03/14 1510  Gross per 24 hour  Intake    540 ml  Output      0 ml  Net    540 ml     Physical Exam  Awake Alert, Oriented X 3, No new F.N deficits, Normal affect [place].AT,PERRAL Supple Neck,No JVD, No cervical lymphadenopathy appriciated.  Symmetrical Chest wall movement, Good air movement bilaterally,+ve wheezing RRR,No Gallops,Rubs or new Murmurs, No Parasternal Heave +ve B.Sounds, Abd Soft, No tenderness, No  organomegaly appriciated, No rebound - guarding or rigidity. No Cyanosis, Clubbing or edema, No new Rash or bruise     Data Review   Micro Results No results found for this or any previous visit (from the past 240 hour(s)).  Radiology Reports Ct Head Wo Contrast  03/21/2014   CLINICAL DATA:  Altered mental status after fall.  EXAM: CT HEAD WITHOUT CONTRAST  TECHNIQUE: Contiguous axial images were obtained from the base of the skull through the vertex without intravenous contrast.  COMPARISON:  CT scan of December 31, 2012.  FINDINGS: Bony calvarium appears intact. Diffuse cortical atrophy is noted. Mild chronic ischemic white matter disease is noted. No mass effect or midline shift is noted. Ventricular size is within normal limits. There is no evidence of mass lesion, hemorrhage or acute infarction.  IMPRESSION: Diffuse cortical atrophy. Mild chronic ischemic white matter disease. No acute intracranial abnormality seen.   Electronically Signed   By: Roque Lias M.D.   On: 03/21/2014 18:26   Ct Angio Chest Pe W/cm &/or Wo Cm  03/26/2014   CLINICAL DATA:  32 -year-old male with acute shortness of breath. Intermediate V/Q scan. Initial encounter.  EXAM: CT ANGIOGRAPHY CHEST WITH CONTRAST  TECHNIQUE: Multidetector CT imaging of the chest was performed using the standard protocol during bolus administration of intravenous contrast. Multiplanar CT image reconstructions and MIPs were obtained to evaluate the vascular anatomy.  CONTRAST:  62mL OMNIPAQUE IOHEXOL 350 MG/ML SOLN  COMPARISON:  High-resolution Chest CT 05/01/2013. Portable chest x-ray 03/24/2014. V/Q scan 03/25/2014.  FINDINGS: Good contrast bolus timing in the pulmonary arterial tree. Respiratory motion artifact at both lung bases. No focal filling defect identified in the pulmonary arterial tree to suggest the presence of acute pulmonary embolism.  Chronic emphysema. Mildly lower lung volumes compared to 2014. Central airways are patent  except for a atelectatic changes. Small bilateral layering pleural effusions. Combination of compressive lower lobe atelectasis and also some patchy right lower lobe pulmonary opacity. In the peripheral right lower lobe there is hyperdense opacity (series 4, image 73 which might be previously aspirated oral contrast, uncertain. However, this was also present to a degree in 2014 and at that time alveolar or microlithiasis was considered. Chronic peripheral increased reticular opacity in the mid lungs is stable.  No pericardial effusion. Calcified coronary artery and aortic atherosclerosis. Stable small right peritracheal and mediastinal lymph nodes. No axillary lymphadenopathy. Negative thoracic inlet.  Negative visualized liver, spleen, pancreas, adrenal glands, kidneys. New moderate-sized gastric hiatal hernia.  Chronic left costovertebral angle rib fractures (e.g. Left seventh rib series 6, image 38). Osteopenia. Mild T12 compression fracture appears nonacute but increased since 2014. No acute osseous abnormality identified.  Review of the MIP images confirms the above findings.  IMPRESSION: 1.  No evidence of acute pulmonary embolus. 2. Chronic emphysema and interstitial lung disease with superimposed small pleural effusions and acute right lower lobe opacity suspicious for pneumonia. 3. Superimposed chronic and progressed right lower lobe hyperdense material with a diagnosis of alveolar microlithiasis considered on the prior study of 2014. 4. Chronic but progressed T12 compression fracture.   Electronically Signed   By: Augusto Gamble M.D.   On: 03/26/2014 13:28   Nm Pulmonary Perf And Vent  03/25/2014   CLINICAL DATA:  Acute on chronic respiratory failure. Hypoxemia. COPD. Emphysema. Encephalopathy.  EXAM: NUCLEAR MEDICINE VENTILATION - PERFUSION LUNG SCAN  TECHNIQUE: Ventilation images were obtained in multiple projections using inhaled aerosol technetium 99 M DTPA. Perfusion images were obtained in multiple  projections after intravenous injection of Tc-83m MAA.  RADIOPHARMACEUTICALS:  6.0 mCi Tc-64m DTPA aerosol and 40 mCi Tc-46m MAA  COMPARISON:  03/24/2014  FINDINGS: Ventilation: Abnormal accumulation of radiotracer medially near the hilum in both lungs. Hypoventilation at the right lung base and in the  lingula. Hypoventilation in the lung apices.  Perfusion: The patient was apparently unable to raise the right arm, leading to a photopenic defect on the lateral projection. Patchy moderate-sized perfusion defect posteriorly in the right lung corresponding to radiographic and ventilation abnormalities. Patchy hypoperfusion in both upper lobes.  IMPRESSION: 1. Intermediate probability of pulmonary embolus (risk 20-79%) due to triple matched large segmental defects in the lower lung zone and multiple moderate to large matched the defects in the upper lungs. There is known airspace opacity in the lung bases as well as severe emphysema especially affecting the upper lobes.   Electronically Signed   By: Herbie BaltimoreWalt  Liebkemann M.D.   On: 03/25/2014 13:22   Dg Chest Port 1 View  03/24/2014   CLINICAL DATA:  Shortness of Breath.  COPD.  Smoker.  A a seen  EXAM: PORTABLE CHEST - 1 VIEW  COMPARISON:  03/21/2014  FINDINGS: Continued left lower lobe airspace disease is similar prior study. In addition, there is increasing right basilar airspace opacity. Findings concerning for multifocal pneumonia.  Underlying COPD/hyperinflation. Heart is borderline in size. No effusions or acute bony abnormality.  IMPRESSION: Stable left lower lobe airspace disease with worsening right basilar airspace opacity. Findings concerning for worsening multifocal pneumonia.   Electronically Signed   By: Charlett NoseKevin  Dover M.D.   On: 03/24/2014 15:17   Dg Chest Port 1 View  03/21/2014   CLINICAL DATA:  Confusion fever.  Hypertension.  COPD.  Cough.  EXAM: PORTABLE CHEST - 1 VIEW  COMPARISON:  12/31/2012.  03/09/2012  FINDINGS: Midline trachea. Mild  cardiomegaly with atherosclerosis in the transverse aorta. No right and no definite left pleural effusion. Lower lobe predominant interstitial thickening is moderate. Right upper lobe scarring. More confluent left lower lobe patchy airspace disease.  IMPRESSION: Left lower lobe airspace disease, suspicious for infection or aspiration. Progression of interstitial lung disease is felt less likely.  Interstitial lung disease as was detailed on 05/01/2013 high-resolution CT. Considerations include alveolar microlithiasis superimposed upon nonspecific interstitial pneumonitis.  Cardiomegaly without congestive failure.   Electronically Signed   By: Jeronimo GreavesKyle  Talbot M.D.   On: 03/21/2014 17:58   Dg Swallowing Func-speech Pathology  03/27/2014   Carolan ShiverAmanda Laurice Couture, CCC-SLP     03/27/2014  2:45 PM Objective Swallowing Evaluation: Modified Barium Swallowing Study   Patient Details  Name: Georgina QuintJames R Steinmeyer MRN: [id number] Date of Birth: [birth year]  Today's Date: 03/27/2014 Time: [id number] SLP Time Calculation (min): 25 min  Past Medical History:  Past Medical History  Diagnosis Date  . Hypertension   . Hypercholesteremia   . BPH (benign prostatic hyperplasia)   . ILD (interstitial lung disease)   . COPD, moderate   . Stroke     Thalamic   Past Surgical History:  Past Surgical History  Procedure Laterality Date  . Cataract extraction Bilateral   . Appendectomy    . Hernia repair    . Flexible sigmoidoscopy N/A 04/24/2013    Procedure: FLEXIBLE SIGMOIDOSCOPY;  Surgeon: Shirley FriarVincent C.  Schooler, MD;  [facility];  Service: Endoscopy;   Laterality: N/A;   HPI:  80 yo male admitted for SOB, productive cough, and confusion with  LLL airspace disease per CXR. PMH includes COPD, ILD, GERD,  hypertension. Pt had MBS in 2013 with deep, silent penetration to  the vocal cords with large sips of thin liquid. SLP recommended a  regular diet and thin liquids with small, single sips to increase  airway protection.  Pt has been followed  for several days with  increasing concerns for aspiration.  Has required a partial  nonrebreather.  Recent CXR showed worsened biltaeral basilar  predominant opacities.  Has been coughing with meals, due in part  to rushing rather than eating more slowly and allowing time for  respiration.  MBS recommended.       Assessment / Plan / Recommendation Clinical Impression  Dysphagia Diagnosis: Mild pharyngeal phase dysphagia  Clinical impression: Pt's swallow abilities appeared to be quite  functional during MBS.  Demonstrated only occasional penetration  of thin liquids, but it was not observed to reach the level of  the vocal cords.  There was mild pharyngeal residue post-swallow.   No aspiration. During assessment, pt's pace of consumption was  controlled by examiner.  When pt is functionally eating/drinking,   he has been observed to do so at rapid rates, leading to  clinical signs of aspiration.    Recommend regular diet, thin liquids but strict precautions to  minimize potential for aspiration:  SLOW RATE; REPLACE NRB BETWEEN BOLUSES OF FOOD/LIQUID AND ALLOW  5-6 BREATHS BEFORE RESUMING EATING.    Treatment Recommendation    Continue per original plan   Diet Recommendation Regular;Thin liquid   Liquid Administration via: Cup;Straw Medication Administration: Whole meds with puree Supervision: Patient able to self feed;Intermittent supervision  to cue for compensatory strategies Compensations: Slow rate;Small sips/bites (breathe 5-6 times  between swallows) Postural Changes and/or Swallow Maneuvers: Seated upright 90  degrees    Other  Recommendations Oral Care Recommendations: Oral care BID   Follow Up Recommendations   (tba)                SLP Swallow Goals     General Date of Onset: 03/27/14 HPI: 80 yo male admitted for SOB, productive cough, and confusion  with LLL airspace disease per CXR. PMH includes COPD, ILD, GERD,  hypertension. Pt had MBS in 2013 with deep, silent penetration to  the vocal cords with large  sips of thin liquid. SLP recommended a  regular diet and thin liquids with small, single sips to increase  airway protection.  Pt has been followed for several days with  increasing concerns for aspiration.  Has required a partial  nonrebreather.  Recent CXR showed worsened biltaeral basilar  predominant opacities.  Has been coughing with meals, due in part  to rushing rather than eating more slowly and allowing time for  respiration.  MBS recommended.   Type of Study: Modified Barium Swallowing Study Reason for Referral: Objectively evaluate swallowing function Previous Swallow Assessment: see HPI Diet Prior to this Study: Dysphagia 3 (soft);Nectar-thick liquids Temperature Spikes Noted: No Respiratory Status: non-rebreather History of Recent Intubation: No Behavior/Cognition: Alert;Cooperative Oral Cavity - Dentition: Adequate natural dentition Oral Motor / Sensory Function: Within functional limits Self-Feeding Abilities: Able to feed self Patient Positioning: Upright in chair Baseline Vocal Quality: Clear Volitional Cough: Strong Volitional Swallow: Able to elicit Anatomy: Within functional limits Pharyngeal Secretions: Not observed secondary MBS    Reason for Referral Objectively evaluate swallowing function   Oral Phase Oral Preparation/Oral Phase Oral Phase: WFL   Pharyngeal Phase Pharyngeal Phase Pharyngeal Phase: Impaired Pharyngeal - Thin Pharyngeal - Thin Cup: Delayed swallow  initiation;Penetration/Aspiration during swallow;Pharyngeal  residue - valleculae Penetration/Aspiration details (thin cup): Material enters  airway, remains ABOVE vocal cords and not ejected out Pharyngeal - Solids Pharyngeal - Puree: Premature spillage to valleculae;Pharyngeal  residue - valleculae Pharyngeal - Regular: Delayed swallow initiation;Pharyngeal  residue - valleculae  Cervical Esophageal Phase    Amanda L. Samson Frederic, MA CCC/SLP Pager [phone]              Blenda Mounts Laurice 03/27/2014, 2:40 PM      CBC No  results found for this basename: WBC, HGB, HCT, PLT, MCV, MCH, MCHC, RDW, NEUTRABS, LYMPHSABS, MONOABS, EOSABS, BASOSABS, BANDABS, BANDSABD,  in the last 168 hours  Chemistries   Recent Labs Lab 03/29/14 0252 03/31/14 0329  NA 139 137  K 4.2 4.7  CL 99 98  CO2 29 28  GLUCOSE 105* 109*  BUN 32* 35*  CREATININE 1.27 1.35  CALCIUM 8.3* 8.8  AST 14  --   ALT 14  --   ALKPHOS 49  --   BILITOT 0.4  --    ------------------------------------------------------------------------------------------------------------------ estimated creatinine clearance is 35.4 ml/min (by C-G formula based on Cr of 1.35). ------------------------------------------------------------------------------------------------------------------ No results found for this basename: HGBA1C,  in the last 72 hours ------------------------------------------------------------------------------------------------------------------ No results found for this basename: CHOL, HDL, LDLCALC, TRIG, CHOLHDL, LDLDIRECT,  in the last 72 hours ------------------------------------------------------------------------------------------------------------------ No results found for this basename: TSH, T4TOTAL, FREET3, T3FREE, THYROIDAB,  in the last 72 hours ------------------------------------------------------------------------------------------------------------------ No results found for this basename: VITAMINB12, FOLATE, FERRITIN, TIBC, IRON, RETICCTPCT,  in the last 72 hours  Coagulation profile No results found for this basename: INR, PROTIME,  in the last 168 hours  No results found for this basename: DDIMER,  in the last 72 hours  Cardiac Enzymes No results found for this basename: CK, CKMB, TROPONINI, MYOGLOBIN,  in the last 168 hours ------------------------------------------------------------------------------------------------------------------ No components found with this basename: POCBNP,      Time Spent in minutes    35   Joel Mericle K M.D on 04/03/2014 at 4:00 PM  Between 7am to 7pm - Pager - [phone]  After 7pm go to [email or web address] - password [email or web address]  And look for the night coverage person covering for me after hours  [facility] Office  [phone]

## 2014-04-03 NOTE — Progress Notes (Signed)
PULMONARY  / CRITICAL CARE MEDICINE CONSULTATION   Name: Jim QuintJames R Contreras MRN: [id number] DOB: [birth year]    ADMISSION DATE:  03/21/2014 CONSULTATION DATE: 03/24/2014  REQUESTING CLINICIAN: Dr. Susie CassetteAbrol PRIMARY SERVICE: TRH  CHIEF COMPLAINT:  SOB  BRIEF PATIENT DESCRIPTION: [age] M with COPD/ILD on chronic O2, patient of PW, who presented 10/8 with SOB likely 2/2 COPD exacerbation.  SIGNIFICANT EVENTS / STUDIES:  10/08 ABG > 7.491/28.6/51.0 10/11 Chest X-ray > with worsening bibasilar opacities concerning for pneumonia 10/15 On SDU, requiring increased O2, Partial NRB 10/19 Palliative care consult 10/20 Family meeting planned with Dr. Ladona Ridgelaylor / palliative care  LINES / TUBES: PIV  CULTURES:  Blood Cultures 10/8 >> Negative Urine Culture 10/8 >> Negative Flu Panel 10/8 >> Negative   SUBJECTIVE:  Remains on HFNC but weaning some - down to 55%, 50L/min.  Denies chest pains, SOB.  No acute events overnight.   VITAL SIGNS: Temp:  [98 F (36.7 C)-98.3 F (36.8 C)] 98.3 F (36.8 C) (10/21 0620) Pulse Rate:  [57-66] 57 (10/21 0620) Resp:  [17-19] 19 (10/21 0620) BP: (98-116)/(52-55) 116/55 mmHg (10/21 0620) SpO2:  [90 %-95 %] 93 % (10/21 0855) FiO2 (%):  [53.1 %-63.9 %] 55.1 % (10/21 0855) Weight:  [153 lb (69.4 kg)] 153 lb (69.4 kg) (10/21 0620)   INTAKE / OUTPUT: Intake/Output     10/20 0701 - 10/21 0700 10/21 0701 - 10/22 0700   P.O. 300    Total Intake(mL/kg) 300 (4.3)    Urine (mL/kg/hr)     Total Output       Net +300          Urine Occurrence 2 x    Stool Occurrence 1 x      PHYSICAL EXAMINATION: General:  Pleasant, elderly male in NAD on high flow O2 but in no distress, speaking full sentences, eating breakfast  Neuro:  No focal deficits. MAE's. HEENT:  [place]/AT, PERRL, MMM. Neck: Trachea supple and midline, (-) LAN or JVD Cardiovascular:  RRR, no MRG Lungs:  resps even, non labored on high flow Derma, Distant with faint rales in b/l bases Abdomen:   S/NT/ND/(+)BS Musculoskeletal:  No deformities, no edema. Skin:  Grossly Intact  LABS:  CBC No results found for this basename: WBC, HGB, HCT, PLT,  in the last 168 hours BMET  Recent Labs Lab 03/29/14 0252 03/31/14 0329  NA 139 137  K 4.2 4.7  CL 99 98  CO2 29 28  BUN 32* 35*  CREATININE 1.27 1.35  GLUCOSE 105* 109*   Electrolytes  Recent Labs Lab 03/29/14 0252 03/31/14 0329  CALCIUM 8.3* 8.8   Sepsis Markers No results found for this basename: LATICACIDVEN, PROCALCITON, O2SATVEN,  in the last 168 hours ABG No results found for this basename: PHART, PCO2ART, PO2ART,  in the last 168 hours Liver Enzymes  Recent Labs Lab 03/29/14 0252  AST 14  ALT 14  ALKPHOS 49  BILITOT 0.4  ALBUMIN 2.6*   Cardiac Enzymes No results found for this basename: TROPONINI, PROBNP,  in the last 168 hours Glucose  Recent Labs Lab 03/27/14 1118  GLUCAP 130*    Imaging No results found.  CXR: no new film   ASSESSMENT / PLAN:  Acute on Chronic Hypoxic Respiratory Failure: With elevated WBC count on presentation and progressive bibasilar infiltrates. Pneumonia can also explain the increased falls noted recently. No active wheeze.  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  Will accept SpO2 of > 80 %, goal by an oximizer  RT and CM working on getting oximyzer in hospital to see if he tolerates  Palliative care following.  Family meeting this am 10/21 Cannot go back to his previous SNF with this level of O2 needs   Goal is titration of O2 down to 8-10L for [facility] v home with hospice PRN morphine for dyspnea  Roxanol upon discharge per palliative care. Dr. Delford FieldWright can be Hospice MD if needed   Jim DressKaty Whiteheart, NP 04/03/2014  9:31 AM Pager: [phone] or [phone]  [phone]   Attending Note: he appears to be tolerating oximizer with adequate SpO2. [facility] and max dose concentrator O2, accept Spo2 below typical norms.   Levy Pupaobert Byrum, MD, PhD 04/03/2014, 4:08 PM [facility] [phone] or if no answer [phone]

## 2014-04-04 ENCOUNTER — Inpatient Hospital Stay (HOSPITAL_COMMUNITY): Payer: Medicare Other

## 2014-04-04 DIAGNOSIS — J9611 Chronic respiratory failure with hypoxia: Secondary | ICD-10-CM

## 2014-04-04 MED ORDER — ONDANSETRON HCL 4 MG/2ML IJ SOLN: 4.0000 mg | Freq: Four times a day (QID) | INTRAMUSCULAR | Status: DC | PRN

## 2014-04-04 NOTE — Evaluation (Signed)
Physical Therapy Evaluation Patient Details Name: Jim QuintJames R Contreras MRN: [id number] DOB: 1923/09/10 Today's Date: 04/04/2014   History of Present Illness  80 y.o. male with Past medical history of hypertension, COPD with chronic resp failure on chronic o2 3LPM, CVA, interstitial lung disease, BPH, diastolic dysfunction, pulmonary hypertension.   Pt adm due to worsening of SOB. Pt with Acute on chronic respiratory failure with hypoxemia. Pt with medical improvement over the last few days and PT was asked to reconsult.   Clinical Impression  Pt admitted with above diagnoses. Pt currently with functional limitations due to the deficits listed below (see PT Problem List). Pt will benefit from skilled PT to increase their independence and safety with mobility to allow discharge to the venue listed below.  [facility]  Will likely need more assistance there than he had before from what I can gather as has significant functional weakness and limited mobility.      Follow Up Recommendations SNF;Supervision/Assistance - 24 hour    Equipment Recommendations  None recommended by PT    Recommendations for Other Services       Precautions / Restrictions Precautions Precautions: Fall Precaution Comments: monitor O2 sats Restrictions Weight Bearing Restrictions: No      Mobility  Bed Mobility Overal bed mobility: Needs Assistance Bed Mobility: Supine to Sit;Rolling Rolling: Modified independent (Device/Increase time) (used rail to roll to each side)   Supine to sit: Mod assist Sit to supine: Min assist      Transfers Overall transfer level: Needs assistance Equipment used: None (Pt used Steady to pull on, or held to chair) Transfers: Sit to/from [employer];Stand Pivot Transfers Sit to Stand: Min assist (pt pulled on steady) Stand pivot transfers: Mod assist (pt required min A to go to chair holding arms, mod A to bed)       General transfer comment:  Practiced standing twice and performed bed to chair and back transfer  Ambulation/Gait Ambulation/Gait assistance:  (NT today)              Stairs            Wheelchair Mobility    Modified Rankin (Stroke Patients Only)       Balance Overall balance assessment: Needs assistance Sitting-balance support: Feet supported Sitting balance-Leahy Scale: Fair     Standing balance support: Bilateral upper extremity supported Standing balance-Leahy Scale: Poor Standing balance comment: Pt stood for about 2 minutes twice holding to [employer]                             Pertinent Vitals/Pain Pain Assessment: No/denies pain    Home Living Family/patient expects to be discharged to:: Other (Comment) [facility]-unsure if he was in ALF or SNF)                      Prior Function Level of Independence: Needs assistance   Gait / Transfers Assistance Needed: pt has been using RW and wheelchair at SNF; reports he could stand and get to wheelchair independently            Hand Dominance   Dominant Hand: Right    Extremity/Trunk Assessment   Upper Extremity Assessment: Defer to OT evaluation           Lower Extremity Assessment: Generalized weakness      Cervical / Trunk Assessment: Kyphotic  Communication   [facility]  Cognition Arousal/Alertness: Awake/alert Behavior During Therapy: WFL for tasks assessed/performed Overall Cognitive Status: Within Functional Limits for tasks assessed                      General Comments General comments (skin integrity, edema, etc.): Pt cooperative during session-states [facility] really [facility]  oxygen sats on 3L between 92% to 80% with activity.    Exercises General Exercises - Lower Extremity Hip Flexion/Marching: AROM;10 reps;Standing;Other (comment) (Greater difficulty lifting RLE)      Assessment/Plan    PT Assessment Patient needs continued PT  services  PT Diagnosis Generalized weakness   PT Problem List Decreased strength;Decreased activity tolerance;Decreased balance;Decreased mobility;Decreased knowledge of use of DME;Cardiopulmonary status limiting activity  PT Treatment Interventions DME instruction;Gait training;Therapeutic activities;Therapeutic exercise;Balance training;Patient/family education   PT Goals (Current goals can be found in the Care Plan section) Acute Rehab PT Goals Patient Stated Goal: [facility] PT Goal Formulation: With patient Time For Goal Achievement: 04/11/14 Potential to Achieve Goals: Good    Frequency Min 3X/week   Barriers to discharge        Co-evaluation               End of Session Equipment Utilized During Treatment: Gait belt;Oxygen;Other (comment) Antony Salmon(Stedy) Activity Tolerance: Patient tolerated treatment well Patient left: in bed;with call bell/phone within reach;with nursing/sitter in room (NT assiting pt to clean after episode of urinary incontinenc) Nurse Communication: Mobility status         Time: [id number] PT Time Calculation (min): 27 min   Charges:   PT Evaluation $Initial PT Evaluation Tier I: 1 Procedure PT Treatments $Gait Training: 8-22 mins   PT G Codes:          Donnella ShamSawulski, Yaniyah Koors J 04/04/2014, 5:45 PM Lavona MoundMark Toula Miyasaki, PT  [phone] 04/04/2014

## 2014-04-04 NOTE — Progress Notes (Signed)
Patient Demographics  Jim Contreras, is a 80 y.o. male, DOB - Jul 22, 1923, ZOX:[id number]  Admit date - 03/21/2014   Admitting Physician [name], MD  Outpatient Primary MD for the patient is [name], MD  LOS - 14   Chief Complaint  Patient presents with  . Fever        Subjective:   Jim Contreras today has, No headache, No chest pain, No abdominal pain - No Nausea, No new weakness tingling or numbness, No Cough - SOB.    Assessment & Plan     Acute on chronic hypoxemic respiratory failure secondary to left lower lobe pneumonia-  Healthcare associated pneumonia  COPD exacerbation  Interstitial lung disease with pulmonary hypertension   Was on high flow o2 at 50 lit/min. Has finished his antibiotic course and steroid taper, currently on oral Lasix 60 mg daily with good effect. Has titrated from 50 L nasal cannula high flow oxygen to 4 L with Oxymizer. Continue chest PT with flutter valve, increase activity, continue nebulizer treatments, likely will discharge to SNF tomorrow. He has had negative CT angiogram of the chest and bilateral venous duplex of the legs was unremarkable as well. Pulmonary following. If worsens again we will consider hospice.  Repeat 2 view chest x-ray today.    Hypokalemia repletedAnd stable.    Chronic stable Diastolic heart failure  Continue Lasix to 60 mg a day , 2-D echo , showed diastolic heart failure EF of 65-70%, Being close to compensated.    Hypertension  Monitor off of blood pressure medications.      BPH  Continue Avodart and Flomax     GERD  continue Protonix     Acute encephalopathy  Fall from bed prior to admission-follow mental status , PT eval recommended SNF      Code Status:  DNR  Family Communication: daughter  And son-in-law  Disposition Plan: TBD likely SNF   Procedures   CTA, Venous US, TTE   Consults PCCM, Pall Care   Medications  Scheduled Meds: . antiseptic oral rinse  7 mL Mouth Rinse BID  . aspirin EC  81 mg Oral Daily  . dutasteride  0.5 mg Oral Daily  . enoxaparin (LOVENOX) injection  40 mg Subcutaneous Q24H  . feeding supplement (ENSURE COMPLETE)  237 mL Oral BID BM  . furosemide  60 mg Oral Daily  . guaiFENesin  600 mg Oral BID  . ipratropium-albuterol  3 mL Nebulization BID  . pantoprazole  40 mg Oral Daily  . potassium chloride  20 mEq Oral BID  . pravastatin  40 mg Oral Daily  . tamsulosin  0.4 mg Oral Daily   Continuous Infusions:  PRN Meds:.albuterol, hydrALAZINE, menthol-cetylpyridinium, traMADol-acetaminophen, traZODone  DVT Prophylaxis  Lovenox   Lab Results  Component Value Date   PLT 204 03/27/2014    Antibiotics     Anti-infectives   Start     Dose/Rate Route Frequency Ordered Stop   03/28/14 1300  cefTRIAXone (ROCEPHIN) 1 g in dextrose 5 % 50 mL IVPB  Status:  Discontinued     1 g 100 mL/hr over 30 Minutes Intravenous Every 24 hours 03/28/14 1151 03/29/14 1047   03/28/14 1300  azithromycin (ZITHROMAX) 500 mg in dextrose  5 % 250 mL IVPB  Status:  Discontinued     500 mg 250 mL/hr over 60 Minutes Intravenous Every 24 hours 03/28/14 1210 03/29/14 1047   03/24/14 1430  imipenem-cilastatin (PRIMAXIN) 250 mg in sodium chloride 0.9 % 100 mL IVPB  Status:  Discontinued     250 mg 200 mL/hr over 30 Minutes Intravenous 4 times per day 03/24/14 1423 03/28/14 1107   03/23/14 1700  azithromycin (ZITHROMAX) tablet 500 mg  Status:  Discontinued     500 mg Oral Daily 03/23/14 1533 03/24/14 1413   03/22/14 2000  vancomycin (VANCOCIN) IVPB 1000 mg/200 mL premix  Status:  Discontinued     1,000 mg 200 mL/hr over 60 Minutes Intravenous Every 24 hours 03/21/14 1840 03/24/14 1413   03/22/14 1700  azithromycin (ZITHROMAX) 500 mg in dextrose 5 % 250 mL IVPB  Status:   Discontinued     500 mg 250 mL/hr over 60 Minutes Intravenous Every 24 hours 03/22/14 1630 03/23/14 1532   03/22/14 0200  aztreonam (AZACTAM) 1 g in dextrose 5 % 50 mL IVPB  Status:  Discontinued     1 g 100 mL/hr over 30 Minutes Intravenous 3 times per day 03/21/14 1840 03/24/14 1413   03/21/14 1730  aztreonam (AZACTAM) 2 g in dextrose 5 % 50 mL IVPB     2 g 100 mL/hr over 30 Minutes Intravenous  Once 03/21/14 1718 03/21/14 1847   03/21/14 1730  vancomycin (VANCOCIN) IVPB 1000 mg/200 mL premix     1,000 mg 200 mL/hr over 60 Minutes Intravenous  Once 03/21/14 1718 03/21/14 2018          Objective:   Filed Vitals:   04/04/14 0900 04/04/14 1005 04/04/14 1009 04/04/14 1015  BP:  99/37 91/44 92/38   Pulse: 58 58    Temp:  94.6 F (34.8 C)    TempSrc:  Oral    Resp: 16 16    Height:      Weight:      SpO2: 92% 96%      Wt Readings from Last 3 Encounters:  04/04/14 68.947 kg (152 lb)  11/28/13 67.586 kg (149 lb)  04/26/13 63.1 kg (139 lb 1.8 oz)     Intake/Output Summary (Last 24 hours) at 04/04/14 1213 Last data filed at 04/03/14 1830  Gross per 24 hour  Intake    480 ml  Output      0 ml  Net    480 ml     Physical Exam  Awake Alert, Oriented X 3, No new F.N deficits, Normal affect [name].AT,PERRAL Supple Neck,No JVD, No cervical lymphadenopathy appriciated.  Symmetrical Chest wall movement, Good air movement bilaterally,+ve wheezing RRR,No Gallops,Rubs or new Murmurs, No Parasternal Heave +ve B.Sounds, Abd Soft, No tenderness, No organomegaly appriciated, No rebound - guarding or rigidity. No Cyanosis, Clubbing or edema, No new Rash or bruise     Data Review   Micro Results No results found for this or any previous visit (from the past 240 hour(s)).  Radiology Reports Ct Head Wo Contrast  03/21/2014   CLINICAL DATA:  Altered mental status after fall.  EXAM: CT HEAD WITHOUT CONTRAST  TECHNIQUE: Contiguous axial images were obtained from the base of the skull  through the vertex without intravenous contrast.  COMPARISON:  CT scan of December 31, 2012.  FINDINGS: Bony calvarium appears intact. Diffuse cortical atrophy is noted. Mild chronic ischemic white matter disease is noted. No mass effect or midline shift is noted. Ventricular size  is within normal limits. There is no evidence of mass lesion, hemorrhage or acute infarction.  IMPRESSION: Diffuse cortical atrophy. Mild chronic ischemic white matter disease. No acute intracranial abnormality seen.   Electronically Signed   By: Roque Lias M.D.   On: 03/21/2014 18:26   Ct Angio Chest Pe W/cm &/or Wo Cm  03/26/2014   CLINICAL DATA:  62 -year-old male with acute shortness of breath. Intermediate V/Q scan. Initial encounter.  EXAM: CT ANGIOGRAPHY CHEST WITH CONTRAST  TECHNIQUE: Multidetector CT imaging of the chest was performed using the standard protocol during bolus administration of intravenous contrast. Multiplanar CT image reconstructions and MIPs were obtained to evaluate the vascular anatomy.  CONTRAST:  62mL OMNIPAQUE IOHEXOL 350 MG/ML SOLN  COMPARISON:  High-resolution Chest CT 05/01/2013. Portable chest x-ray 03/24/2014. V/Q scan 03/25/2014.  FINDINGS: Good contrast bolus timing in the pulmonary arterial tree. Respiratory motion artifact at both lung bases. No focal filling defect identified in the pulmonary arterial tree to suggest the presence of acute pulmonary embolism.  Chronic emphysema. Mildly lower lung volumes compared to 2014. Central airways are patent except for a atelectatic changes. Small bilateral layering pleural effusions. Combination of compressive lower lobe atelectasis and also some patchy right lower lobe pulmonary opacity. In the peripheral right lower lobe there is hyperdense opacity (series 4, image 73 which might be previously aspirated oral contrast, uncertain. However, this was also present to a degree in 2014 and at that time alveolar or microlithiasis was considered.  Chronic peripheral increased reticular opacity in the mid lungs is stable.  No pericardial effusion. Calcified coronary artery and aortic atherosclerosis. Stable small right peritracheal and mediastinal lymph nodes. No axillary lymphadenopathy. Negative thoracic inlet.  Negative visualized liver, spleen, pancreas, adrenal glands, kidneys. New moderate-sized gastric hiatal hernia.  Chronic left costovertebral angle rib fractures (e.g. Left seventh rib series 6, image 38). Osteopenia. Mild T12 compression fracture appears nonacute but increased since 2014. No acute osseous abnormality identified.  Review of the MIP images confirms the above findings.  IMPRESSION: 1.  No evidence of acute pulmonary embolus. 2. Chronic emphysema and interstitial lung disease with superimposed small pleural effusions and acute right lower lobe opacity suspicious for pneumonia. 3. Superimposed chronic and progressed right lower lobe hyperdense material with a diagnosis of alveolar microlithiasis considered on the prior study of 2014. 4. Chronic but progressed T12 compression fracture.   Electronically Signed   By: Augusto Gamble M.D.   On: 03/26/2014 13:28   Nm Pulmonary Perf And Vent  03/25/2014   CLINICAL DATA:  Acute on chronic respiratory failure. Hypoxemia. COPD. Emphysema. Encephalopathy.  EXAM: NUCLEAR MEDICINE VENTILATION - PERFUSION LUNG SCAN  TECHNIQUE: Ventilation images were obtained in multiple projections using inhaled aerosol technetium 99 M DTPA. Perfusion images were obtained in multiple projections after intravenous injection of Tc-73m MAA.  RADIOPHARMACEUTICALS:  6.0 mCi Tc-42m DTPA aerosol and 40 mCi Tc-36m MAA  COMPARISON:  03/24/2014  FINDINGS: Ventilation: Abnormal accumulation of radiotracer medially near the hilum in both lungs. Hypoventilation at the right lung base and in the lingula. Hypoventilation in the lung apices.  Perfusion: The patient was apparently unable to raise the right arm, leading to a photopenic  defect on the lateral projection. Patchy moderate-sized perfusion defect posteriorly in the right lung corresponding to radiographic and ventilation abnormalities. Patchy hypoperfusion in both upper lobes.  IMPRESSION: 1. Intermediate probability of pulmonary embolus (risk 20-79%) due to triple matched large segmental defects in the lower lung zone and multiple moderate to  large matched the defects in the upper lungs. There is known airspace opacity in the lung bases as well as severe emphysema especially affecting the upper lobes.   Electronically Signed   By: Herbie Baltimore M.D.   On: 03/25/2014 13:22   Dg Chest Port 1 View  03/24/2014   CLINICAL DATA:  Shortness of Breath.  COPD.  Smoker.  A a seen  EXAM: PORTABLE CHEST - 1 VIEW  COMPARISON:  03/21/2014  FINDINGS: Continued left lower lobe airspace disease is similar prior study. In addition, there is increasing right basilar airspace opacity. Findings concerning for multifocal pneumonia.  Underlying COPD/hyperinflation. Heart is borderline in size. No effusions or acute bony abnormality.  IMPRESSION: Stable left lower lobe airspace disease with worsening right basilar airspace opacity. Findings concerning for worsening multifocal pneumonia.   Electronically Signed   By: Charlett Nose M.D.   On: 03/24/2014 15:17   Dg Chest Port 1 View  03/21/2014   CLINICAL DATA:  Confusion fever.  Hypertension.  COPD.  Cough.  EXAM: PORTABLE CHEST - 1 VIEW  COMPARISON:  12/31/2012.  03/09/2012  FINDINGS: Midline trachea. Mild cardiomegaly with atherosclerosis in the transverse aorta. No right and no definite left pleural effusion. Lower lobe predominant interstitial thickening is moderate. Right upper lobe scarring. More confluent left lower lobe patchy airspace disease.  IMPRESSION: Left lower lobe airspace disease, suspicious for infection or aspiration. Progression of interstitial lung disease is felt less likely.  Interstitial lung disease as was detailed on  05/01/2013 high-resolution CT. Considerations include alveolar microlithiasis superimposed upon nonspecific interstitial pneumonitis.  Cardiomegaly without congestive failure.   Electronically Signed   By: Jeronimo Greaves M.D.   On: 03/21/2014 17:58   Dg Swallowing Func-speech Pathology  03/27/2014   Carolan Shiver, CCC-SLP     03/27/2014  2:45 PM Objective Swallowing Evaluation: Modified Barium Swallowing Study   Patient Details  Name: ABRAHAM ENTWISTLE MRN: [id number] Date of Birth: 1923/11/24  Today's Date: 03/27/2014 Time: 5284-1324 SLP Time Calculation (min): 25 min  Past Medical History:  Past Medical History  Diagnosis Date  . Hypertension   . Hypercholesteremia   . BPH (benign prostatic hyperplasia)   . ILD (interstitial lung disease)   . COPD, moderate   . Stroke     Thalamic   Past Surgical History:  Past Surgical History  Procedure Laterality Date  . Cataract extraction Bilateral   . Appendectomy    . Hernia repair    . Flexible sigmoidoscopy N/A 04/24/2013    Procedure: FLEXIBLE SIGMOIDOSCOPY;  Surgeon: Shirley Friar, MD;  Location: [facility] ENDOSCOPY;  Service: Endoscopy;   Laterality: N/A;   HPI:  80 yo male admitted for SOB, productive cough, and confusion with  LLL airspace disease per CXR. PMH includes COPD, ILD, GERD,  hypertension. Pt had MBS in 2013 with deep, silent penetration to  the vocal cords with large sips of thin liquid. SLP recommended a  regular diet and thin liquids with small, single sips to increase  airway protection.  Pt has been followed for several days with  increasing concerns for aspiration.  Has required a partial  nonrebreather.  Recent CXR showed worsened biltaeral basilar  predominant opacities.  Has been coughing with meals, due in part  to rushing rather than eating more slowly and allowing time for  respiration.  MBS recommended.       Assessment / Plan / Recommendation Clinical Impression  Dysphagia Diagnosis: Mild pharyngeal phase dysphagia  Clinical  impression: Pt's swallow abilities appeared to be quite  functional during MBS.  Demonstrated only occasional penetration  of thin liquids, but it was not observed to reach the level of  the vocal cords.  There was mild pharyngeal residue post-swallow.   No aspiration. During assessment, pt's pace of consumption was  controlled by examiner.  When pt is functionally eating/drinking,   he has been observed to do so at rapid rates, leading to  clinical signs of aspiration.    Recommend regular diet, thin liquids but strict precautions to  minimize potential for aspiration:  SLOW RATE; REPLACE NRB BETWEEN BOLUSES OF FOOD/LIQUID AND ALLOW  5-6 BREATHS BEFORE RESUMING EATING.    Treatment Recommendation    Continue per original plan   Diet Recommendation Regular;Thin liquid   Liquid Administration via: Cup;Straw Medication Administration: Whole meds with puree Supervision: Patient able to self feed;Intermittent supervision  to cue for compensatory strategies Compensations: Slow rate;Small sips/bites (breathe 5-6 times  between swallows) Postural Changes and/or Swallow Maneuvers: Seated upright 90  degrees    Other  Recommendations Oral Care Recommendations: Oral care BID   Follow Up Recommendations   (tba)                SLP Swallow Goals     General Date of Onset: 03/27/14 HPI: 80 yo male admitted for SOB, productive cough, and confusion  with LLL airspace disease per CXR. PMH includes COPD, ILD, GERD,  hypertension. Pt had MBS in 2013 with deep, silent penetration to  the vocal cords with large sips of thin liquid. SLP recommended a  regular diet and thin liquids with small, single sips to increase  airway protection.  Pt has been followed for several days with  increasing concerns for aspiration.  Has required a partial  nonrebreather.  Recent CXR showed worsened biltaeral basilar  predominant opacities.  Has been coughing with meals, due in part  to rushing rather than eating more slowly and allowing time for   respiration.  MBS recommended.   Type of Study: Modified Barium Swallowing Study Reason for Referral: Objectively evaluate swallowing function Previous Swallow Assessment: see HPI Diet Prior to this Study: Dysphagia 3 (soft);Nectar-thick liquids Temperature Spikes Noted: No Respiratory Status: non-rebreather History of Recent Intubation: No Behavior/Cognition: Alert;Cooperative Oral Cavity - Dentition: Adequate natural dentition Oral Motor / Sensory Function: Within functional limits Self-Feeding Abilities: Able to feed self Patient Positioning: Upright in chair Baseline Vocal Quality: Clear Volitional Cough: Strong Volitional Swallow: Able to elicit Anatomy: Within functional limits Pharyngeal Secretions: Not observed secondary MBS    Reason for Referral Objectively evaluate swallowing function   Oral Phase Oral Preparation/Oral Phase Oral Phase: WFL   Pharyngeal Phase Pharyngeal Phase Pharyngeal Phase: Impaired Pharyngeal - Thin Pharyngeal - Thin Cup: Delayed swallow  initiation;Penetration/Aspiration during swallow;Pharyngeal  residue - valleculae Penetration/Aspiration details (thin cup): Material enters  airway, remains ABOVE vocal cords and not ejected out Pharyngeal - Solids Pharyngeal - Puree: Premature spillage to valleculae;Pharyngeal  residue - valleculae Pharyngeal - Regular: Delayed swallow initiation;Pharyngeal  residue - valleculae  Cervical Esophageal Phase    Amanda L. Couture, MA CCC/SLP Pager [phone]              Blenda Mounts Laurice 03/27/2014, 2:40 PM      CBC No results found for this basename: WBC, HGB, HCT, PLT, MCV, MCH, MCHC, RDW, NEUTRABS, LYMPHSABS, MONOABS, EOSABS, BASOSABS, BANDABS, BANDSABD,  in the last 168 hours  Chemistries   Recent Labs Lab 03/29/14 0252  03/31/14 0329  NA 139 137  K 4.2 4.7  CL 99 98  CO2 29 28  GLUCOSE 105* 109*  BUN 32* 35*  CREATININE 1.27 1.35  CALCIUM 8.3* 8.8  AST 14  --   ALT 14  --   ALKPHOS 49  --   BILITOT 0.4  --     ------------------------------------------------------------------------------------------------------------------ estimated creatinine clearance is 35.4 ml/min (by C-G formula based on Cr of 1.35). ------------------------------------------------------------------------------------------------------------------ No results found for this basename: HGBA1C,  in the last 72 hours ------------------------------------------------------------------------------------------------------------------ No results found for this basename: CHOL, HDL, LDLCALC, TRIG, CHOLHDL, LDLDIRECT,  in the last 72 hours ------------------------------------------------------------------------------------------------------------------ No results found for this basename: TSH, T4TOTAL, FREET3, T3FREE, THYROIDAB,  in the last 72 hours ------------------------------------------------------------------------------------------------------------------ No results found for this basename: VITAMINB12, FOLATE, FERRITIN, TIBC, IRON, RETICCTPCT,  in the last 72 hours  Coagulation profile No results found for this basename: INR, PROTIME,  in the last 168 hours  No results found for this basename: DDIMER,  in the last 72 hours  Cardiac Enzymes No results found for this basename: CK, CKMB, TROPONINI, MYOGLOBIN,  in the last 168 hours ------------------------------------------------------------------------------------------------------------------ No components found with this basename: POCBNP,      Time Spent in minutes   35   SINGH,PRASHANT K M.D on 04/04/2014 at 12:13 PM  Between 7am to 7pm - Pager - [phone]  After 7pm go to [email or web address] - password [email or web address]  And look for the night coverage person covering for me after hours  [facility] Office  [phone]

## 2014-04-04 NOTE — Progress Notes (Signed)
Patient called stating he felt nauseous.  Dr. Thedore MinsSingh notified, PRN zofran ordered.  RN returned to patient's bedside, but patient stated nausea had passed.  Patient alert and oriented x4 throughout shift.  Patient up to chair with 2 assist and Stedy.  Family at bedside this AM.  Patient on 5L via nasal cannula with oxymizer, saturation 90-95%.  Patient denies any questions or concerns at this time.  Will continue to monitor.

## 2014-04-04 NOTE — Progress Notes (Signed)
PULMONARY  / CRITICAL CARE MEDICINE CONSULTATION   Name: Jim Contreras R Milham MRN: [id number] DOB: 03-01-1924    ADMISSION DATE:  03/21/2014 CONSULTATION DATE: 03/24/2014  REQUESTING CLINICIAN: Dr. Susie CassetteAbrol PRIMARY SERVICE: TRH  CHIEF COMPLAINT:  SOB  BRIEF PATIENT DESCRIPTION: [age] M with COPD/ILD on chronic O2, patient of PW, who presented 10/8 with SOB likely 2/2 COPD exacerbation.  SIGNIFICANT EVENTS / STUDIES:  10/08 ABG > 7.491/28.6/51.0 10/11 Chest X-ray > with worsening bibasilar opacities concerning for pneumonia 10/15 On SDU, requiring increased O2, Partial NRB 10/19 Palliative care consult 10/20 Family meeting planned with Dr. Ladona Ridgelaylor / palliative care  LINES / TUBES: PIV  CULTURES:  Blood Cultures 10/8 >> Negative Urine Culture 10/8 >> Negative Flu Panel 10/8 >> Negative    SUBJECTIVE:  Much improved, sitting OOB in chair eating breakfast.  Denies SOB.  Wearing 8L oximyzer   VITAL SIGNS: Temp:  [97.5 F (36.4 C)-98 F (36.7 C)] 97.5 F (36.4 C) (10/22 0533) Pulse Rate:  [58-63] 58 (10/22 0900) Resp:  [16] 16 (10/22 0900) BP: (103-110)/(47-54) 105/54 mmHg (10/22 0533) SpO2:  [91 %-99 %] 92 % (10/22 0900) Weight:  [152 lb (68.947 kg)] 152 lb (68.947 kg) (10/22 0629)   INTAKE / OUTPUT: Intake/Output     10/21 0701 - 10/22 0700 10/22 0701 - 10/23 0700   P.O. 720    Total Intake(mL/kg) 720 (10.4)    Net +720          Urine Occurrence 6 x 1 x     PHYSICAL EXAMINATION: General:  Pleasant, elderly male in NAD on oximyzer in no distress, speaking full sentences, OOB in chair   Neuro:  No focal deficits. MAE's. HEENT:  [place]/AT, PERRL, MMM. Neck: Trachea supple and midline, (-) LAN or JVD Cardiovascular:  RRR, no MRG Lungs:  resps even, non labored on oximyzer, Distant with faint rales in b/l bases Abdomen:  S/NT/ND/(+)BS Musculoskeletal:  No deformities, no edema. Skin:  Grossly Intact  LABS:  CBC No results found for this basename: WBC, HGB, HCT, PLT,  in  the last 168 hours BMET  Recent Labs Lab 03/29/14 0252 03/31/14 0329  NA 139 137  K 4.2 4.7  CL 99 98  CO2 29 28  BUN 32* 35*  CREATININE 1.27 1.35  GLUCOSE 105* 109*   Electrolytes  Recent Labs Lab 03/29/14 0252 03/31/14 0329  CALCIUM 8.3* 8.8   Sepsis Markers No results found for this basename: LATICACIDVEN, PROCALCITON, O2SATVEN,  in the last 168 hours ABG No results found for this basename: PHART, PCO2ART, PO2ART,  in the last 168 hours Liver Enzymes  Recent Labs Lab 03/29/14 0252  AST 14  ALT 14  ALKPHOS 49  BILITOT 0.4  ALBUMIN 2.6*   Cardiac Enzymes No results found for this basename: TROPONINI, PROBNP,  in the last 168 hours Glucose No results found for this basename: GLUCAP,  in the last 168 hours  Imaging No results found.  CXR: no new film   ASSESSMENT / PLAN:  Acute on Chronic Hypoxic Respiratory Failure: With elevated WBC count on presentation and progressive bibasilar infiltrates. Pneumonia can also explain the increased falls noted recently. No active wheeze.  ILD with possible flare, prob chronic aspiration  Severe COPD / emphysema endstage Pulm HTN  Plan: -DNR / DNI Status -Continue Duonebs and PRN albuterol -Aggressive pulm hygiene as able  -Mobilize as tol  -Dose lasix daily as renal fxn / BP permit.  -Will accept SpO2 of > 80 %, goal  by an oximizer  -PT/OT to see  -PRN morphine for dyspnea  -Roxanol upon discharge per palliative care. -Dr. Delford FieldWright can be Hospice MD if needed   With improvement and tol 4 - 6L oximizer, pt[facility]family [facility] (may be too well for [facility] at this point).  Would highly recommend plans in place for NO return to hospital if worsens, just symptom management/Hospice involvement and [facility]   Dirk DressKaty Whiteheart, NP 04/04/2014  9:33 AM Pager: [phone] or [phone]  *Care during the described time  interval was provided by me and/or other providers on the critical care team. I have reviewed this patient's available data, including medical history, events of note, physical examination and test results as part of my evaluation.  Levy Pupaobert Adiah Guereca, MD, PhD 04/04/2014, 11:43 AM [facility] [phone] or if no answer [phone]

## 2014-04-04 NOTE — Clinical Social Work Note (Signed)
[facility] admissions liaison to visit with pt at bedside at 3pm to re-assess for possible return to [facility].  Marcelline Deistmily Lakeisa Heninger, LCSWA [phone]) Licensed Clinical Social Worker Orthopedics [phone]([id number]) and Surgical [phone]([id number])

## 2014-04-04 NOTE — Clinical Social Work Note (Signed)
CSW updated regarding pt's medical condition. CSW met with pt and pt's daughter, [name], at bedside to discuss discharge disposition. CSW informed pt and pt's daughter of PT latest recommendation being for SNF placement at time of discharge. Pt and pt's daughter requesting pt to be discharged back to [facility]. Pt's daughter stated pt's family able to provide private duty assistance if needed, in order for pt to return to [facility]. CSW informed pt and pt's daughter [facility] will need to re-evaluate pt and make the determination from re-assessment. Pt and pt's daughter understanding and agreeable. RN, also present at bedside, stated new PT order has been placed for re-evaluation of pt.  CSW contacted [facility] admissions liaison, who stated a representative will be visiting pt at bedside on 04/04/2014 to complete evaluation. CSW to continue to follow and assist with discharge planning needs.  Lubertha Sayres, [place] [phone]) Licensed Clinical Social Worker Orthopedics [phone]) and Surgical [phone])

## 2014-04-04 NOTE — Progress Notes (Signed)
PT Cancellation Note  Patient Details Name: Georgina QuintJames R Bordner MRN: [id number] DOB: 1923-06-29   Cancelled Treatment:    Reason Eval/Treat Not Completed: Patient declined, no reason specified.  Pt upset about PT arrival and very adamantly declining any mobility at this time.  Will try back another time.     Sunny SchleinRitenour, Djibril Glogowski F, [place]PT [phone] 04/04/2014, 2:16 PM

## 2014-04-05 MED ORDER — INFLUENZA VAC SPLIT QUAD 0.5 ML IM SUSY
0.5000 mL | PREFILLED_SYRINGE | INTRAMUSCULAR | Status: DC
  Filled 2014-04-05: qty 0.5

## 2014-04-05 NOTE — Discharge Instructions (Signed)
Follow with Primary MD Laurell JosephsMorrow, Aaron P, MD and your primary pulmonary physician Dr. Delford FieldWright in 3-4 days   Get CBC, CMP, 2 view Chest X ray checked  by Primary MD next visit.    Activity: As tolerated with Full fall precautions use walker/cane & assistance as needed   Disposition SNF versus ALF   Diet: Heart Healthy   with feeding assistance and aspiration precautions as needed.  For Heart failure patients - Check your Weight same time everyday, if you gain over 2 pounds, or you develop in leg swelling, experience more shortness of breath or chest pain, call your Primary MD immediately. Follow Cardiac Low Salt Diet and 1.8 lit/day fluid restriction.   On your next visit with your primary care physician please Get Medicines reviewed and adjusted.   Please request your Prim.MD to go over all Hospital Tests and Procedure/Radiological results at the follow up, please get all Hospital records sent to your Prim MD by signing hospital release before you go home.   If you experience worsening of your admission symptoms, develop shortness of breath, life threatening emergency, suicidal or homicidal thoughts you must seek medical attention immediately by calling 911 or calling your MD immediately  if symptoms less severe.  You Must read complete instructions/literature along with all the possible adverse reactions/side effects for all the Medicines you take and that have been prescribed to you. Take any new Medicines after you have completely understood and accpet all the possible adverse reactions/side effects.   Do not drive, operating heavy machinery, perform activities at heights, swimming or participation in water activities or provide baby sitting services if your were admitted for syncope or siezures until you have seen by Primary MD or a Neurologist and advised to do so again.  Do not drive when taking Pain medications.    Do not take more than prescribed Pain, Sleep and Anxiety  Medications  Special Instructions: If you have smoked or chewed Tobacco  in the last 2 yrs please stop smoking, stop any regular Alcohol  and or any Recreational drug use.  Wear Seat belts while driving.   Please note  You were cared for by a hospitalist during your hospital stay. If you have any questions about your discharge medications or the care you received while you were in the hospital after you are discharged, you can call the unit and asked to speak with the hospitalist on call if the hospitalist that took care of you is not available. Once you are discharged, your primary care physician will handle any further medical issues. Please note that NO REFILLS for any discharge medications will be authorized once you are discharged, as it is imperative that you return to your primary care physician (or establish a relationship with a primary care physician if you do not have one) for your aftercare needs so that they can reassess your need for medications and monitor your lab values.

## 2014-04-05 NOTE — Progress Notes (Signed)
Report was given to Arther DamesPriscilla Harris at  [facility].

## 2014-04-05 NOTE — Discharge Summary (Signed)
Jim QuintJames R Contreras, is a 80 y.o. male  DOB Jan 24, 1924  MRN [id number].  Admission date:  03/21/2014  Admitting Physician  Lynden OxfordPranav Patel, MD  Discharge Date:  04/05/2014   Primary MD  Jim JosephsMorrow, Jim P, MD  Recommendations for primary care physician for things to follow:   Check CBC, BMP oxygen demand and 2 view chest x-ray in 5-7 days   Admission Diagnosis  BPH (benign prostatic hyperplasia) [N40.0] HCAP (healthcare-associated pneumonia) [J18.9] Essential hypertension [I10] Acute on chronic respiratory failure with hypoxemia [J96.21] Accident due to mechanical fall without injury [W19.XXXA]   Discharge Diagnosis  BPH (benign prostatic hyperplasia) [N40.0] HCAP (healthcare-associated pneumonia) [J18.9] Essential hypertension [I10] Acute on chronic respiratory failure with hypoxemia [J96.21] Accident due to mechanical fall without injury [W19.XXXA]     Principal Problem:   Acute on chronic respiratory failure with hypoxemia Active Problems:   COPD with emphysema Gold C    ILD (interstitial lung disease)   Pulmonary hypertension   Hypertension   Collagenous colitis   DNR (do not resuscitate)   Pneumonia   Accidental fall from bed   Acute encephalopathy   Palliative care patient      Past Medical History  Diagnosis Date  . Hypertension   . Hypercholesteremia   . BPH (benign prostatic hyperplasia)   . ILD (interstitial lung disease)   . COPD, moderate   . Stroke     Thalamic    Past Surgical History  Procedure Laterality Date  . Cataract extraction Bilateral   . Appendectomy    . Hernia repair    . Flexible sigmoidoscopy N/A 04/24/2013    Procedure: FLEXIBLE SIGMOIDOSCOPY;  Surgeon: Shirley FriarVincent C. Schooler, MD;  [facility];  Service: Endoscopy;  Laterality: N/A;       History of present illness and   Hospital Course:     Kindly see H&Contreras for history of present illness and admission details, please review complete Labs, Consult reports and Test reports for all details in brief  HPI  from the history and physical done on the day of admission  Jim Contreras is a 80 y.o. male with Past medical history of hypertension, COPD with chronic resp failure on chronic o2 3LPM, CVA, interstitial lung disease, BPH, diastolic dysfunction, pulmonary hypertension.  The patient is presenting with complaints of shortness of breath ongoing with last 1 week progressively worsening along with cough with expectoration. He also had fever and chills. He denies any chest pain. He denies any choking episode. He is from an assisted living facility. No recent travel reported. No leg swelling or leg tenderness. No palpitation dizziness lightheadedness nausea vomiting abdominal pain diarrhea constipation burning urination.  Medication has been notified based on the documentation available from the nursing home and no recent change identified.  Initially the history was reported that the patient had a fall twice on my discussion with the nursing home staff today while the patient was coming out of the bed when his wheelchair was not locked, He fell down on the  ground, no head injury or neck injury reported. Also since last 2 days the patient has not been acting himself as per the nursing home staff.   Hospital Course   Acute on chronic hypoxemic respiratory failure secondary to left lower lobe pneumonia -  Healthcare associated pneumonia  COPD exacerbation  Interstitial lung disease with pulmonary hypertension    Was on high flow o2 at 50 lit/min. initially palliative care was called and plan was to send him with hospice to be can place, he had  finished his antibiotic course and steroid taper, and was diuresed but still requiring 50 L of-oxygen. Note he had a CT angiogram this admission which was nonacute, venous duplex  legs unremarkable.  We added some pulmonary toiletry with flutter valve, moved him from bed bound status to recliner with good effect reducing his gravity dependent atelectasis, he has now titrated from 50 L nasal cannula high flow oxygen to 4 L with Oxymizer, pulse ox in low 90s at this point patient and family want to go back to ALF which will be done. We'll follow with his PCP and primary pulmonologist Dr. Delford Field within 4-7 days. Explained to the family that if he declines again hospice is a reasonable option      Hypokalemia repletedAnd stable.     Chronic stable Diastolic heart failure  Diuresed, blood pressure soft for continuous Lasix, discharge or heart healthy diet with fluid restriction , 2-D echo , showed diastolic heart failure EF of 65-70%, currently completely compensated.     Hypertension  Monitor off of blood pressure medications.    BPH  Continue Avodart and Flomax    GERD  continue Protonix    Acute encephalopathy  Completely resolved and at baseline now family and patient want to go to ALF.        Discharge Condition: Currently stable guarded long-term prognosis due to advanced underlying COPD   Follow UP  Follow-up Information   Follow up with Jim Levans, MD. Schedule an appointment as soon as possible for a visit in 1 week.   Specialty:  Pulmonary Disease   Contact information:   [address] [place] [address] [phone]       Follow up with Jim Josephs, MD. Schedule an appointment as soon as possible for a visit in 3 days.   Specialty:  Specialist   Contact information:   [address] [place] [address] [phone]         Discharge Instructions  and  Discharge Medications          Discharge Instructions   Discharge instructions    Complete by:  As directed   Follow with Primary MD Jim Josephs, MD and your primary pulmonary physician Dr. Delford Field in 3-4 days   Get CBC, CMP, 2 view Chest X ray  checked  by Primary MD next visit.    Activity: As tolerated with Full fall precautions use walker/cane & assistance as needed   Disposition SNF versus ALF   Diet: Heart Healthy   with feeding assistance and aspiration precautions as needed.  For Heart failure patients - Check your Weight same time everyday, if you gain over 2 pounds, or you develop in leg swelling, experience more shortness of breath or chest pain, call your Primary MD immediately. Follow Cardiac Low Salt Diet and 1.8 lit/day fluid restriction.   On your next visit with your primary care physician please Get Medicines reviewed and adjusted.   Please request  your Prim.MD to go over all Hospital Tests and Procedure/Radiological results at the follow up, please get all Hospital records sent to your Prim MD by signing hospital release before you go home.   If you experience worsening of your admission symptoms, develop shortness of breath, life threatening emergency, suicidal or homicidal thoughts you must seek medical attention immediately by calling 911 or calling your MD immediately  if symptoms less severe.  You Must read complete instructions/literature along with all the possible adverse reactions/side effects for all the Medicines you take and that have been prescribed to you. Take any new Medicines after you have completely understood and accpet all the possible adverse reactions/side effects.   Do not drive, operating heavy machinery, perform activities at heights, swimming or participation in water activities or provide baby sitting services if your were admitted for syncope or siezures until you have seen by Primary MD or a Neurologist and advised to do so again.  Do not drive when taking Pain medications.    Do not take more than prescribed Pain, Sleep and Anxiety Medications  Special Instructions: If you have smoked or chewed Tobacco  in the last 2 yrs please stop smoking, stop any regular Alcohol  and or any  Recreational drug use.  Wear Seat belts while driving.   Please note  You were cared for by a hospitalist during your hospital stay. If you have any questions about your discharge medications or the care you received while you were in the hospital after you are discharged, you can call the unit and asked to speak with the hospitalist on call if the hospitalist that took care of you is not available. Once you are discharged, your primary care physician will handle any further medical issues. Please note that NO REFILLS for any discharge medications will be authorized once you are discharged, as it is imperative that you return to your primary care physician (or establish a relationship with a primary care physician if you do not have one) for your aftercare needs so that they can reassess your need for medications and monitor your lab values.     Increase activity slowly    Complete by:  As directed             Medication List    STOP taking these medications       amLODipine 5 MG tablet  Commonly known as:  NORVASC     losartan 50 MG tablet  Commonly known as:  COZAAR      TAKE these medications       albuterol (2.5 MG/3ML) 0.083% nebulizer solution  Commonly known as:  PROVENTIL  Take 3 mLs (2.5 mg total) by nebulization every 4 (four) hours as needed for wheezing or shortness of breath.     aspirin 81 MG tablet  Take 81 mg by mouth daily.     dutasteride 0.5 MG capsule  Commonly known as:  AVODART  Take 0.5 mg by mouth daily.     feeding supplement (ENSURE COMPLETE) Liqd  Take 237 mLs by mouth 2 (two) times daily between meals.     guaiFENesin 600 MG 12 hr tablet  Commonly known as:  MUCINEX  Take 1 tablet (600 mg total) by mouth 2 (two) times daily.     ipratropium-albuterol 0.5-2.5 (3) MG/3ML Soln  Commonly known as:  DUONEB  Take 3 mLs by nebulization 3 (three) times daily.     lansoprazole 30 MG capsule  Commonly known as:  PREVACID  Take 30 mg by mouth daily  at 12 noon.     LORazepam 2 MG/ML concentrated solution  Commonly known as:  ATIVAN  Take 0.5 mLs (1 mg total) by mouth every 6 (six) hours as needed for anxiety.     morphine CONCENTRATE 10 mg / 0.5 ml concentrated solution  Take 0.25 mLs (5 mg total) by mouth every 4 (four) hours as needed for severe pain or shortness of breath.     pravastatin 40 MG tablet  Commonly known as:  PRAVACHOL  Take 40 mg by mouth daily.     tamsulosin 0.4 MG Caps capsule  Commonly known as:  FLOMAX  Take 0.4 mg by mouth daily.     tiotropium 18 MCG inhalation capsule  Commonly known as:  SPIRIVA HANDIHALER  Place 1 capsule (18 mcg total) into inhaler and inhale daily.     traMADol-acetaminophen 37.5-325 MG per tablet  Commonly known as:  ULTRACET  Take 1 tablet by mouth every 8 (eight) hours as needed for moderate pain or severe pain.     traZODone 50 MG tablet  Commonly known as:  DESYREL  Take 1 tablet (50 mg total) by mouth at bedtime as needed for sleep.          Diet and Activity recommendation: See Discharge Instructions above   Consults obtained - PCCM, Pall Care   Major procedures and Radiology Reports - PLEASE review detailed and final reports for all details, in brief -       Dg Chest 2 View  04/04/2014   CLINICAL DATA:  Shortness of breath, history of COPD, interstitial lung disease, and pulmonary hypertension.  EXAM: CHEST  2 VIEW  COMPARISON:  Portable chest x-ray of March 24, 2014  FINDINGS: The lungs are adequately inflated. The interstitial markings at both lung bases remain increased but are less conspicuous today. There are chronic fibrotic changes in the right upper lobe. The heart and pulmonary vascularity are within the limits of normal. There is mild tortuosity of the descending thoracic aorta. The bony thorax is unremarkable.  IMPRESSION: There are stable changes of COPD. The pulmonary interstitial markings remain increased bilaterally but are less conspicuous  today this may reflect residual interstitial pneumonia with some improvement.   Electronically Signed   By: Jim  Contreras   On: 04/04/2014 13:42   Ct Head Wo Contrast  03/21/2014   CLINICAL DATA:  Altered mental status after fall.  EXAM: CT HEAD WITHOUT CONTRAST  TECHNIQUE: Contiguous axial images were obtained from the base of the skull through the vertex without intravenous contrast.  COMPARISON:  CT scan of December 31, 2012.  FINDINGS: Bony calvarium appears intact. Diffuse cortical atrophy is noted. Mild chronic ischemic white matter disease is noted. No mass effect or midline shift is noted. Ventricular size is within normal limits. There is no evidence of mass lesion, hemorrhage or acute infarction.  IMPRESSION: Diffuse cortical atrophy. Mild chronic ischemic white matter disease. No acute intracranial abnormality seen.   Electronically Signed   By: Jim Lias M.D.   On: 03/21/2014 18:26   Ct Angio Chest Pe W/cm &/or Wo Cm  03/26/2014   CLINICAL DATA:  23 -year-old male with acute shortness of breath. Intermediate V/Q scan. Initial encounter.  EXAM: CT ANGIOGRAPHY CHEST WITH CONTRAST  TECHNIQUE: Multidetector CT imaging of the chest was performed using the standard protocol during bolus administration of intravenous contrast. Multiplanar CT image reconstructions and MIPs were obtained to evaluate the vascular  anatomy.  CONTRAST:  62mL OMNIPAQUE IOHEXOL 350 MG/ML SOLN  COMPARISON:  High-resolution Chest CT 05/01/2013. Portable chest x-ray 03/24/2014. V/Q scan 03/25/2014.  FINDINGS: Good contrast bolus timing in the pulmonary arterial tree. Respiratory motion artifact at both lung bases. No focal filling defect identified in the pulmonary arterial tree to suggest the presence of acute pulmonary embolism.  Chronic emphysema. Mildly lower lung volumes compared to 2014. Central airways are patent except for a atelectatic changes. Small bilateral layering pleural effusions. Combination of compressive  lower lobe atelectasis and also some patchy right lower lobe pulmonary opacity. In the peripheral right lower lobe there is hyperdense opacity (series 4, image 73 which might be previously aspirated oral contrast, uncertain. However, this was also present to a degree in 2014 and at that time alveolar or microlithiasis was considered. Chronic peripheral increased reticular opacity in the mid lungs is stable.  No pericardial effusion. Calcified coronary artery and aortic atherosclerosis. Stable small right peritracheal and mediastinal lymph nodes. No axillary lymphadenopathy. Negative thoracic inlet.  Negative visualized liver, spleen, pancreas, adrenal glands, kidneys. New moderate-sized gastric hiatal hernia.  Chronic left costovertebral angle rib fractures (e.g. Left seventh rib series 6, image 38). Osteopenia. Mild T12 compression fracture appears nonacute but increased since 2014. No acute osseous abnormality identified.  Review of the MIP images confirms the above findings.  IMPRESSION: 1.  No evidence of acute pulmonary embolus. 2. Chronic emphysema and interstitial lung disease with superimposed small pleural effusions and acute right lower lobe opacity suspicious for pneumonia. 3. Superimposed chronic and progressed right lower lobe hyperdense material with a diagnosis of alveolar microlithiasis considered on the prior study of 2014. 4. Chronic but progressed T12 compression fracture.   Electronically Signed   By: Jim GambleLee  Hall M.D.   On: 03/26/2014 13:28   Nm Pulmonary Perf And Vent  03/25/2014   CLINICAL DATA:  Acute on chronic respiratory failure. Hypoxemia. COPD. Emphysema. Encephalopathy.  EXAM: NUCLEAR MEDICINE VENTILATION - PERFUSION LUNG SCAN  TECHNIQUE: Ventilation images were obtained in multiple projections using inhaled aerosol technetium 99 M DTPA. Perfusion images were obtained in multiple projections after intravenous injection of Tc-852m MAA.  RADIOPHARMACEUTICALS:  6.0 mCi Tc-4352m DTPA aerosol  and 40 mCi Tc-3552m MAA  COMPARISON:  03/24/2014  FINDINGS: Ventilation: Abnormal accumulation of radiotracer medially near the hilum in both lungs. Hypoventilation at the right lung base and in the lingula. Hypoventilation in the lung apices.  Perfusion: The patient was apparently unable to raise the right arm, leading to a photopenic defect on the lateral projection. Patchy moderate-sized perfusion defect posteriorly in the right lung corresponding to radiographic and ventilation abnormalities. Patchy hypoperfusion in both upper lobes.  IMPRESSION: 1. Intermediate probability of pulmonary embolus (risk 20-79%) due to triple matched large segmental defects in the lower lung zone and multiple moderate to large matched the defects in the upper lungs. There is known airspace opacity in the lung bases as well as severe emphysema especially affecting the upper lobes.   Electronically Signed   By: Jim BaltimoreWalt  Liebkemann M.D.   On: 03/25/2014 13:22   Dg Chest Port 1 View  03/24/2014   CLINICAL DATA:  Shortness of Breath.  COPD.  Smoker.  A a seen  EXAM: PORTABLE CHEST - 1 VIEW  COMPARISON:  03/21/2014  FINDINGS: Continued left lower lobe airspace disease is similar prior study. In addition, there is increasing right basilar airspace opacity. Findings concerning for multifocal pneumonia.  Underlying COPD/hyperinflation. Heart is borderline in size. No effusions or  acute bony abnormality.  IMPRESSION: Stable left lower lobe airspace disease with worsening right basilar airspace opacity. Findings concerning for worsening multifocal pneumonia.   Electronically Signed   By: Jim Nose M.D.   On: 03/24/2014 15:17   Dg Chest Port 1 View  03/21/2014   CLINICAL DATA:  Confusion fever.  Hypertension.  COPD.  Cough.  EXAM: PORTABLE CHEST - 1 VIEW  COMPARISON:  12/31/2012.  03/09/2012  FINDINGS: Midline trachea. Mild cardiomegaly with atherosclerosis in the transverse aorta. No right and no definite left pleural effusion. Lower lobe  predominant interstitial thickening is moderate. Right upper lobe scarring. More confluent left lower lobe patchy airspace disease.  IMPRESSION: Left lower lobe airspace disease, suspicious for infection or aspiration. Progression of interstitial lung disease is felt less likely.  Interstitial lung disease as was detailed on 05/01/2013 high-resolution CT. Considerations include alveolar microlithiasis superimposed upon nonspecific interstitial pneumonitis.  Cardiomegaly without congestive failure.   Electronically Signed   By: Jim Greaves M.D.   On: 03/21/2014 17:58   Dg Swallowing Func-speech Pathology  03/27/2014   Jim Contreras, Jim Contreras     03/27/2014  2:45 PM Objective Swallowing Evaluation: Modified Barium Swallowing Study   Patient Details  Name: TARRIS DELBENE MRN: [id number] Date of Birth: Jun 08, 1924  Today's Date: 03/27/2014 Time: 4098-1191 SLP Time Calculation (min): 25 min  Past Medical History:  Past Medical History  Diagnosis Date  . Hypertension   . Hypercholesteremia   . BPH (benign prostatic hyperplasia)   . ILD (interstitial lung disease)   . COPD, moderate   . Stroke     Thalamic   Past Surgical History:  Past Surgical History  Procedure Laterality Date  . Cataract extraction Bilateral   . Appendectomy    . Hernia repair    . Flexible sigmoidoscopy N/A 04/24/2013    Procedure: FLEXIBLE SIGMOIDOSCOPY;  Surgeon: Shirley Friar, MD;  [facility] END[facility]COPY;  Service: Endoscopy;   Laterality: N/A;   HPI:  80 yo male admitted for SOB, productive cough, and confusion with  LLL airspace disease per CXR. PMH includes COPD, ILD, GERD,  hypertension. Pt had MBS in 2013 with deep, silent penetration to  the vocal cords with large sips of thin liquid. SLP recommended a  regular diet and thin liquids with small, single sips to increase  airway protection.  Pt has been followed for several days with  increasing concerns for aspiration.  Has required a partial  nonrebreather.  Recent CXR showed  worsened biltaeral basilar  predominant opacities.  Has been coughing with meals, due in part  to rushing rather than eating more slowly and allowing time for  respiration.  MBS recommended.       Assessment / Plan / Recommendation Clinical Impression  Dysphagia Diagnosis: Mild pharyngeal phase dysphagia  Clinical impression: Pt's swallow abilities appeared to be quite  functional during MBS.  Demonstrated only occasional penetration  of thin liquids, but it was not observed to reach the level of  the vocal cords.  There was mild pharyngeal residue post-swallow.   No aspiration. During assessment, pt's pace of consumption was  controlled by examiner.  When pt is functionally eating/drinking,   he has been observed to do so at rapid rates, leading to  clinical signs of aspiration.    Recommend regular diet, thin liquids but strict precautions to  minimize potential for aspiration:  SLOW RATE; REPLACE NRB BETWEEN BOLUSES OF FOOD/LIQUID AND ALLOW  5-6 BREATHS BEFORE RESUMING EATING.  Treatment Recommendation    Continue per original plan   Diet Recommendation Regular;Thin liquid   Liquid Administration via: Cup;Straw Medication Administration: Whole meds with puree Supervision: Patient able to self feed;Intermittent supervision  to cue for compensatory strategies Compensations: Slow rate;Small sips/bites (breathe 5-6 times  between swallows) Postural Changes and/or Swallow Maneuvers: Seated upright 90  degrees    Other  Recommendations Oral Care Recommendations: Oral care BID   Follow Up Recommendations   (tba)                SLP Swallow Goals     General Date of Onset: 03/27/14 HPI: 80 yo male admitted for SOB, productive cough, and confusion  with LLL airspace disease per CXR. PMH includes COPD, ILD, GERD,  hypertension. Pt had MBS in 2013 with deep, silent penetration to  the vocal cords with large sips of thin liquid. SLP recommended a  regular diet and thin liquids with small, single sips to increase  airway  protection.  Pt has been followed for several days with  increasing concerns for aspiration.  Has required a partial  nonrebreather.  Recent CXR showed worsened biltaeral basilar  predominant opacities.  Has been coughing with meals, due in part  to rushing rather than eating more slowly and allowing time for  respiration.  MBS recommended.   Type of Study: Modified Barium Swallowing Study Reason for Referral: Objectively evaluate swallowing function Previous Swallow Assessment: see HPI Diet Prior to this Study: Dysphagia 3 (soft);Nectar-thick liquids Temperature Spikes Noted: No Respiratory Status: non-rebreather History of Recent Intubation: No Behavior/Cognition: Alert;Cooperative Oral Cavity - Dentition: Adequate natural dentition Oral Motor / Sensory Function: Within functional limits Self-Feeding Abilities: Able to feed self Patient Positioning: Upright in chair Baseline Vocal Quality: Clear Volitional Cough: Strong Volitional Swallow: Able to elicit Anatomy: Within functional limits Pharyngeal Secretions: Not observed secondary MBS    Reason for Referral Objectively evaluate swallowing function   Oral Phase Oral Preparation/Oral Phase Oral Phase: WFL   Pharyngeal Phase Pharyngeal Phase Pharyngeal Phase: Impaired Pharyngeal - Thin Pharyngeal - Thin Cup: Delayed swallow  initiation;Penetration/Aspiration during swallow;Pharyngeal  residue - valleculae Penetration/Aspiration details (thin cup): Material enters  airway, remains ABOVE vocal cords and not ejected out Pharyngeal - Solids Pharyngeal - Puree: Premature spillage to valleculae;Pharyngeal  residue - valleculae Pharyngeal - Regular: Delayed swallow initiation;Pharyngeal  residue - valleculae  Cervical Esophageal Phase    Jim Contreras, [place] CCC/SLP Pager [phone]              Blenda Mounts Laurice 03/27/2014, 2:40 PM     Micro Results      No results found for this or any previous visit (from the past 240 hour(s)).     Today    Subjective:   Jim Contreras today has no headache,no chest abdominal pain,no new weakness tingling or numbness, feels much better wants to go home today.    Objective:   Blood pressure 103/52, pulse 59, temperature 98.7 F (37.1 C), temperature source Oral, resp. rate 17, height 5\' 7"  (1.702 m), weight 69.763 kg (153 lb 12.8 oz), SpO2 94.00%.   Intake/Output Summary (Last 24 hours) at 04/05/14 1023 Last data filed at 04/05/14 0935  Gross per 24 hour  Intake    850 ml  Output      0 ml  Net    850 ml    Exam Awake Alert, Oriented x 3, No new F.N deficits, Normal affect Lynd.AT,PERRAL Supple Neck,No JVD, No cervical lymphadenopathy appriciated.  Symmetrical Chest wall movement, Good air movement bilaterally, minimal bilateral wheezing RRR,No Gallops,Rubs or new Murmurs, No Parasternal Heave +ve B.Sounds, Abd Soft, Non tender, No organomegaly appriciated, No rebound -guarding or rigidity. No Cyanosis, Clubbing or edema, No new Rash or bruise  Data Review   CBC w Diff: Lab Results  Component Value Date   WBC 9.9 03/27/2014   HGB 13.4 03/27/2014   HCT 42.0 03/27/2014   PLT 204 03/27/2014   LYMPHOPCT 3* 03/22/2014   MONOPCT 3 03/22/2014   EOSPCT 0 03/22/2014   BASOPCT 0 03/22/2014    CMP: Lab Results  Component Value Date   NA 137 03/31/2014   K 4.7 03/31/2014   CL 98 03/31/2014   CO2 28 03/31/2014   BUN 35* 03/31/2014   CREATININE 1.35 03/31/2014   PROT 6.0 03/29/2014   ALBUMIN 2.6* 03/29/2014   BILITOT 0.4 03/29/2014   ALKPHOS 49 03/29/2014   AST 14 03/29/2014   ALT 14 03/29/2014  .   Total Time in preparing paper work, data evaluation and todays exam - 35 minutes  Leroy Sea M.D on 04/05/2014 at 10:23 AM  [facility] Office  [phone]

## 2014-04-05 NOTE — Progress Notes (Signed)
PULMONARY  / CRITICAL CARE MEDICINE CONSULTATION   Name: Jim Contreras MRN: [id number] DOB: 12-02-1923    ADMISSION DATE:  03/21/2014 CONSULTATION DATE: 03/24/2014  REQUESTING CLINICIAN: Dr. Susie CassetteAbrol PRIMARY SERVICE: TRH  CHIEF COMPLAINT:  SOB  BRIEF PATIENT DESCRIPTION: [age] M with COPD/ILD on chronic O2, patient of PW, who presented 10/8 with SOB likely 2/2 COPD exacerbation.  SIGNIFICANT EVENTS / STUDIES:  10/08  ABG > 7.491/28.6/51.0 10/11  Chest X-ray > with worsening bibasilar opacities concerning for pneumonia 10/15  On SDU, requiring increased O2, Partial NRB 10/19  Palliative care consult 10/20  Family meeting planned with Dr. Ladona Ridgelaylor / palliative care 10/22  Improved, sitting OOB, wearing 8L oximyzer 10/23  On 3L via Oxymizer, sats 88-93%   LINES / TUBES: PIV  CULTURES:  Blood Cultures 10/8 >> Negative Urine Culture 10/8 >> Negative Flu Panel 10/8 >> Negative    SUBJECTIVE: Pt reports feeling better, no acute complaints.    VITAL SIGNS: Temp:  [94.6 F (34.8 C)-98.7 F (37.1 C)] 98.7 F (37.1 C) (10/23 0531) Pulse Rate:  [55-61] 59 (10/23 0531) Resp:  [16-17] 17 (10/23 0531) BP: (91-112)/(37-52) 103/52 mmHg (10/23 0531) SpO2:  [91 %-96 %] 94 % (10/23 0801) FiO2 (%):  [24.2 %] 24.2 % (10/23 0801) Weight:  [153 lb 12.8 oz (69.763 kg)] 153 lb 12.8 oz (69.763 kg) (10/23 0531)   INTAKE / OUTPUT: Intake/Output     10/22 0701 - 10/23 0700 10/23 0701 - 10/24 0700   P.O. 850 240   Total Intake(mL/kg) 850 (12.2) 240 (3.4)   Net +850 +240        Urine Occurrence 6 x    Stool Occurrence       PHYSICAL EXAMINATION: General:  Pleasant, elderly male in NAD on oximyzer in no distress, speaking full sentences Neuro:  No focal deficits. MAE's. HEENT:  [place]/AT, PERRL, MMM. Neck: Trachea supple and midline, (-) LAN or JVD Cardiovascular:  RRR, no MRG Lungs:  resps even, non labored on oximyzer, Distant with faint rales in b/l bases Abdomen:   S/NT/ND/(+)BS Musculoskeletal:  No deformities, no edema. Skin:  Grossly Intact  LABS:  CBC No results found for this basename: WBC, HGB, HCT, PLT,  in the last 168 hours BMET  Recent Labs Lab 03/31/14 0329  NA 137  K 4.7  CL 98  CO2 28  BUN 35*  CREATININE 1.35  GLUCOSE 109*   Electrolytes  Recent Labs Lab 03/31/14 0329  CALCIUM 8.8    Imaging Dg Chest 2 View  04/04/2014   CLINICAL DATA:  Shortness of breath, history of COPD, interstitial lung disease, and pulmonary hypertension.  EXAM: CHEST  2 VIEW  COMPARISON:  Portable chest x-ray of March 24, 2014  FINDINGS: The lungs are adequately inflated. The interstitial markings at both lung bases remain increased but are less conspicuous today. There are chronic fibrotic changes in the right upper lobe. The heart and pulmonary vascularity are within the limits of normal. There is mild tortuosity of the descending thoracic aorta. The bony thorax is unremarkable.  IMPRESSION: There are stable changes of COPD. The pulmonary interstitial markings remain increased bilaterally but are less conspicuous today this may reflect residual interstitial pneumonia with some improvement.   Electronically Signed   By: David  Swazil[place]Jordan   On: 04/04/2014 13:42     ASSESSMENT / PLAN:  Acute on Chronic Hypoxic Respiratory Failure: With elevated WBC count on presentation and progressive bibasilar infiltrates. Pneumonia can also explain the increased falls  noted recently. No active wheeze.  ILD with possible flare, prob chronic aspiration  Severe COPD / emphysema endstage Pulm HTN  Plan: -DNR / DNI Status -Continue Duonebs and PRN albuterol -Aggressive pulm hygiene   -Mobilize  -Dose lasix daily as renal fxn / BP permit.  -Will accept SpO2 of > 80 %, goal by an oximizer  -PT/OT to see  -PRN morphine for dyspnea  -Roxanol upon discharge per palliative care. -Dr. Delford FieldWright can be Hospice MD if needed -Recommend flu shot prior to discharge.  Up to  date on pneumonia-vax  With improvement and tol 3 - 6L oximizer, pt/family want to consider [facility] (too well for [facility] at this point).  Would highly recommend plans in place for NO return to hospital if worsens, just symptom management/Hospice involvement and [facility].   PCCM will be available PRN.  Please call if needs arise.    Canary BrimBrandi Ollis, NP-C [facility] Pgr: [phone] or [phone] 04/05/2014  9:37 AM  Levy Pupaobert Lovelle Deitrick, MD, PhD 04/05/2014, 11:26 AM [facility] [phone] or if no answer [phone]

## 2014-04-05 NOTE — Clinical Social Work Note (Signed)
[facility] to [facility].  Pt's daughter, Bonita QuinLinda, updated regarding discharge.  [facility]: [phone] [address]  Marcelline Deistmily Shamere Campas, [place] [phone]) Licensed Clinical Social Worker Orthopedics [phone]([id number]) and Surgical [phone]([id number])

## 2014-04-08 ENCOUNTER — Telehealth: Payer: Self-pay | Admitting: Critical Care Medicine

## 2014-04-08 NOTE — Telephone Encounter (Signed)
atc X2, line rang 3 times and then went to fast busy signal.  [facility]

## 2014-04-09 NOTE — Telephone Encounter (Signed)
Called spoke with patient's daughter and advised of PW's recommendations as stated below.  Jim Contreras voiced her understanding and stated [facility] - she is aware this was being discussed during the hospital stay but reports pt had improved and she was unsure if pt was still heading in that direction.  She will discuss this with TP at the 11.3.15 HFU (date requested by daughter).  Nothing further needed at this time; will sign off **the only ipratropium on pt's med list is the Duoneb rx'd at the 10.23.15 discharge.  Will remove from the med list

## 2014-04-09 NOTE — Telephone Encounter (Signed)
Spoke with patients daughter-she states she needs to know  1)what liter flow patient should be on with oximizer and without (patient was on 2L/M prior to recent hospital stay(d/c'd on 04-05-14)----> patient uses oximizer with his concentrator while sitting but not when he kneels to get into wheelchair or moving around-uses usual nasal cannula  2)patient is staying tired all the time as well-would like to know if this is coming from his 16 day stay in the hospital or something else  3)when to follow up with PW as no openings for HFU or okay to see TP for HFU---> patient is in assisted living and will have to work around their transportation for patient to have an appt-also this way the daughter can be here with the patient  4)patients insurance sent a letter stating that Ipratropium is not on patients formulary list and needs to have this changed or have our office initiate an exception tier coverage  Daughter is aware that PW is back in the office tomorrow AM 04/10/14 and can advise then.   Thanks.

## 2014-04-09 NOTE — Telephone Encounter (Signed)
D/c ipratroprium Oxygen 3L rest 4L exertion See TP for post hosp f/u Tired from end stage lung disease  Is he getting hospice services?

## 2014-04-16 ENCOUNTER — Encounter: Payer: Self-pay | Admitting: Adult Health

## 2014-04-16 ENCOUNTER — Ambulatory Visit (INDEPENDENT_AMBULATORY_CARE_PROVIDER_SITE_OTHER): Payer: Medicare Other | Admitting: Adult Health

## 2014-04-16 ENCOUNTER — Ambulatory Visit (INDEPENDENT_AMBULATORY_CARE_PROVIDER_SITE_OTHER)
Admission: RE | Admit: 2014-04-16 | Discharge: 2014-04-16 | Disposition: A | Discharge status: Home / Self Care | Payer: Medicare Other | Source: Ambulatory Visit | Attending: Adult Health | Admitting: Adult Health

## 2014-04-16 VITALS — BP 138/82 | HR 62 | Temp 96.7°F

## 2014-04-16 DIAGNOSIS — J431 Panlobular emphysema: Secondary | ICD-10-CM

## 2014-04-16 DIAGNOSIS — J189 Pneumonia, unspecified organism: Secondary | ICD-10-CM

## 2014-04-16 NOTE — Patient Instructions (Addendum)
Continue on Spiriva 1 puff once daily .  Use Oxygen 3l/m at rest and 4l/m with activity .  Follow up Dr. Delford FieldWright in 6 weeks and as needed  Flu shot today

## 2014-04-18 NOTE — Progress Notes (Signed)
Subjective:    Patient ID: Jim QuintJames R Ruzich, male    DOB: 10/18/1923, 80 y.o.   MRN: [id number]  HPI  80 y.o.  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   04/16/14 Post Hospital follow up  Patient returns for a post hospital follow-up. Patient was admitted October 8 through October 23 for acute on chronic hypoxic respiratory failure with healthcare associated pneumonia. Patient was found to have a left lower lobe pneumonia. He was treated with aggressive IV antibiotics, Nebulized bronchodilators, and pulmonary toiletry with flutter valve. Patient did require some diuresis with decompensated diastolic heart failure. Since discharge. Patient is feeling improved. He denies any hemoptysis, orthopnea, PND, or increased leg swelling     Review of Systems  Constitutional:   No  weight loss, night sweats,  Fevers, chills, + fatigue, lassitude. HEENT:   No headaches,  Difficulty swallowing,  Tooth/dental problems,  Sore throat,                No sneezing, itching, ear ache, + nasal congestion, post nasal drip,   CV:  No chest pain,  Orthopnea, PND, swelling in lower extremities, anasarca, dizziness, palpitations  GI  No heartburn, indigestion, abdominal pain, nausea, vomiting, diarrhea, change in bowel habits, loss of appetite  Resp:    No chest wall deformity  Skin: no rash or lesions.  GU: no dysuria, change in color of urine, no urgency or frequency.  No flank pain.  MS:  No joint pain or swelling.  No decreased range of motion.  No back pain.  Psych:  No change in mood or affect. No depression or anxiety.  No memory loss.     Objective:   Physical Exam  Filed Vitals:   04/16/14 1219  BP: 138/82  Pulse: 62  Temp: 96.7 F (35.9 C)  TempSrc: Oral  SpO2: 92%    Gen: Pleasant,   in no distress,  normal affect  ENT: No lesions,  mouth clear,  oropharynx  clear, no postnasal drip  Neck: No JVD, no TMG, no carotid bruits  Lungs: No use of accessory muscles, no dullness to percussion, distant BS, dry rales  Cardiovascular: RRR, heart sounds normal, no murmur or gallops, no peripheral edema  Abdomen: soft and NT, no HSM,  BS normal  Musculoskeletal: No deformities, no cyanosis or clubbing  Neuro: alert, non focal  Skin: Warm, no lesions or rashes  No results found.   PFT 07/12/12:  Fev1 84%  TLC 101%  DLCO 61% PFT 11/10/12: FeV1 87% FeV1/FVC 86%  TLC 84%    DLCO 37%  CXR 04/16/14 Chronic lung disease without focal pneumonia.      Assessment & Plan:   COPD with emphysema Gold C  Recent flare w/ hospital admission   Plan  Continue on Spiriva 1 puff once daily .  Use Oxygen 3l/m at rest and 4l/m with activity .  Follow up Dr. Delford FieldWright in 6 weeks and as needed  Flu shot today   Pneumonia Completed full treatment of left lower lobe pneumonia Clinically improved Chest x-ray today shows clear pneumonia  Plan  continue on current regimen    Updated Medication List Outpatient Encounter Prescriptions as of 04/16/2014  Medication Sig  . albuterol (PROVENTIL) (2.5 MG/3ML) 0.083% nebulizer solution Take 3 mLs (2.5 mg total) by nebulization every 4 (four) hours as needed for wheezing or shortness of breath.  Marland Kitchen. aspirin  81 MG tablet Take 81 mg by mouth daily.  Marland Kitchen. dutasteride (AVODART) 0.5 MG capsule Take 0.5 mg by mouth daily.  . feeding supplement, ENSURE COMPLETE, (ENSURE COMPLETE) LIQD Take 237 mLs by mouth 2 (two) times daily between meals.  Marland Kitchen. guaiFENesin (MUCINEX) 600 MG 12 hr tablet Take 1 tablet (600 mg total) by mouth 2 (two) times daily.  . lansoprazole (PREVACID) 30 MG capsule Take 30 mg by mouth daily at 12 noon.  Marland Kitchen. LORazepam (ATIVAN) 2 MG/ML concentrated solution Take 0.5 mLs (1 mg total) by mouth every 6 (six) hours as needed for anxiety.  . Morphine Sulfate (MORPHINE CONCENTRATE) 10 mg / 0.5 ml concentrated solution Take  0.25 mLs (5 mg total) by mouth every 4 (four) hours as needed for severe pain or shortness of breath.  . pravastatin (PRAVACHOL) 40 MG tablet Take 40 mg by mouth daily.  . Tamsulosin HCl (FLOMAX) 0.4 MG CAPS Take 0.4 mg by mouth daily.  . traMADol-acetaminophen (ULTRACET) 37.5-325 MG per tablet Take 1 tablet by mouth every 8 (eight) hours as needed for moderate pain or severe pain.  . traZODone (DESYREL) 50 MG tablet Take 1 tablet (50 mg total) by mouth at bedtime as needed for sleep.

## 2014-04-18 NOTE — Assessment & Plan Note (Signed)
Completed full treatment of left lower lobe pneumonia Clinically improved Chest x-ray today shows clear pneumonia  Plan  continue on current regimen

## 2014-04-18 NOTE — Assessment & Plan Note (Signed)
Recent flare w/ hospital admission   Plan  Continue on Spiriva 1 puff once daily .  Use Oxygen 3l/m at rest and 4l/m with activity .  Follow up Dr. Delford FieldWright in 6 weeks and as needed  Flu shot today

## 2014-05-25 ENCOUNTER — Emergency Department (HOSPITAL_BASED_OUTPATIENT_CLINIC_OR_DEPARTMENT_OTHER)
Admission: EM | Admit: 2014-05-25 | Discharge: 2014-05-25 | Disposition: A | Discharge status: Home / Self Care | Payer: Medicare Other | Attending: Emergency Medicine | Admitting: Emergency Medicine

## 2014-05-25 ENCOUNTER — Encounter (HOSPITAL_BASED_OUTPATIENT_CLINIC_OR_DEPARTMENT_OTHER): Payer: Self-pay | Admitting: *Deleted

## 2014-05-25 ENCOUNTER — Emergency Department (HOSPITAL_BASED_OUTPATIENT_CLINIC_OR_DEPARTMENT_OTHER): Payer: Medicare Other

## 2014-05-25 DIAGNOSIS — I10 Essential (primary) hypertension: Secondary | ICD-10-CM | POA: Insufficient documentation

## 2014-05-25 DIAGNOSIS — Y92129 Unspecified place in nursing home as the place of occurrence of the external cause: Secondary | ICD-10-CM | POA: Diagnosis not present

## 2014-05-25 DIAGNOSIS — Z79899 Other long term (current) drug therapy: Secondary | ICD-10-CM | POA: Diagnosis not present

## 2014-05-25 DIAGNOSIS — S60031A Contusion of right middle finger without damage to nail, initial encounter: Secondary | ICD-10-CM | POA: Insufficient documentation

## 2014-05-25 DIAGNOSIS — Z7982 Long term (current) use of aspirin: Secondary | ICD-10-CM | POA: Insufficient documentation

## 2014-05-25 DIAGNOSIS — E780 Pure hypercholesterolemia: Secondary | ICD-10-CM | POA: Insufficient documentation

## 2014-05-25 DIAGNOSIS — S6000XA Contusion of unspecified finger without damage to nail, initial encounter: Secondary | ICD-10-CM

## 2014-05-25 DIAGNOSIS — Z87891 Personal history of nicotine dependence: Secondary | ICD-10-CM | POA: Insufficient documentation

## 2014-05-25 DIAGNOSIS — Y998 Other external cause status: Secondary | ICD-10-CM | POA: Diagnosis not present

## 2014-05-25 DIAGNOSIS — Z880 Allergy status to penicillin: Secondary | ICD-10-CM | POA: Insufficient documentation

## 2014-05-25 DIAGNOSIS — R05 Cough: Secondary | ICD-10-CM

## 2014-05-25 DIAGNOSIS — X58XXXA Exposure to other specified factors, initial encounter: Secondary | ICD-10-CM | POA: Diagnosis not present

## 2014-05-25 DIAGNOSIS — Z8673 Personal history of transient ischemic attack (TIA), and cerebral infarction without residual deficits: Secondary | ICD-10-CM | POA: Diagnosis not present

## 2014-05-25 DIAGNOSIS — R059 Cough, unspecified: Secondary | ICD-10-CM

## 2014-05-25 DIAGNOSIS — Y939 Activity, unspecified: Secondary | ICD-10-CM | POA: Insufficient documentation

## 2014-05-25 DIAGNOSIS — J441 Chronic obstructive pulmonary disease with (acute) exacerbation: Secondary | ICD-10-CM | POA: Diagnosis not present

## 2014-05-25 DIAGNOSIS — N400 Enlarged prostate without lower urinary tract symptoms: Secondary | ICD-10-CM | POA: Insufficient documentation

## 2014-05-25 MED ORDER — PREDNISONE 50 MG PO TABS
60.0000 mg | ORAL_TABLET | Freq: Once | ORAL | Status: AC
  Administered 2014-05-25: 60 mg via ORAL
  Filled 2014-05-25 (×2): qty 1

## 2014-05-25 MED ORDER — BENZONATATE 100 MG PO CAPS: 100.0000 mg | ORAL_CAPSULE | Freq: Three times a day (TID) | ORAL | Status: DC

## 2014-05-25 MED ORDER — DOXYCYCLINE HYCLATE 100 MG PO CAPS: 100.0000 mg | ORAL_CAPSULE | Freq: Two times a day (BID) | ORAL | Status: DC

## 2014-05-25 MED ORDER — ALBUTEROL SULFATE (2.5 MG/3ML) 0.083% IN NEBU: 2.5000 mg | INHALATION_SOLUTION | Freq: Four times a day (QID) | RESPIRATORY_TRACT | Status: DC | PRN

## 2014-05-25 MED ORDER — PREDNISONE 20 MG PO TABS: 20.0000 mg | ORAL_TABLET | Freq: Every day | ORAL | Status: DC

## 2014-05-25 MED ORDER — DOXYCYCLINE HYCLATE 100 MG PO TABS
100.0000 mg | ORAL_TABLET | Freq: Two times a day (BID) | ORAL | Status: DC
Start: 2014-05-25 — End: 2014-05-25
  Administered 2014-05-25: 100 mg via ORAL
  Filled 2014-05-25: qty 1

## 2014-05-25 NOTE — ED Notes (Addendum)
Patient was brought from NH by daughter, states he has a worsening cough. Pt is on home 02 and family concerned he may have pneumonia. Pt also has right hand pain, bruising and swelling noted to the inside of his fingers.

## 2014-05-25 NOTE — ED Provider Notes (Signed)
CSN: [id number]     Arrival date & time 05/25/14  1121 History   First MD Initiated Contact with Patient 05/25/14 1217     Chief Complaint  Patient presents with  . Cough      HPI  Patient presents for evaluation of one finger pain and swelling, and #2 a cough for the last several days. His daughter drove him in from his nursing facility. Has a history of COPD. Been coughing for 2 weeks. He knows of pain and swelling of his right middle finger for the last several days. He does not recall any injury. No fevers no chills. Dry nonproductive cough. No leg pain or swelling. No chest pain or fever.  Past Medical History  Diagnosis Date  . Hypertension   . Hypercholesteremia   . BPH (benign prostatic hyperplasia)   . ILD (interstitial lung disease)   . COPD, moderate   . Stroke     Thalamic   Past Surgical History  Procedure Laterality Date  . Cataract extraction Bilateral   . Appendectomy    . Hernia repair    . Flexible sigmoidoscopy N/A 04/24/2013    Procedure: FLEXIBLE SIGMOIDOSCOPY;  Surgeon: Shirley FriarVincent C. Schooler, MD;  [facility];  Service: Endoscopy;  Laterality: N/A;   No family history on file. History  Substance Use Topics  . Smoking status: Former Smoker -- 1.00 packs/day for 40 years    Types: Cigarettes    Quit date: 06/15/1971  . Smokeless tobacco: Never Used  . Alcohol Use: Yes     Comment: sparingly    Review of Systems  Constitutional: Negative for fever, chills, diaphoresis, appetite change and fatigue.  HENT: Negative for mouth sores, sore throat and trouble swallowing.   Eyes: Negative for visual disturbance.  Respiratory: Positive for cough and shortness of breath. Negative for chest tightness and wheezing.   Cardiovascular: Negative for chest pain.  Gastrointestinal: Negative for nausea, vomiting, abdominal pain, diarrhea and abdominal distention.  Endocrine: Negative for polydipsia, polyphagia and polyuria.  Genitourinary: Negative for  dysuria, frequency and hematuria.  Musculoskeletal: Negative for gait problem.       Ecchymosis and swelling and pain to the right middle finger.  Skin: Negative for color change, pallor and rash.  Neurological: Negative for dizziness, syncope, light-headedness and headaches.  Hematological: Does not bruise/bleed easily.  Psychiatric/Behavioral: Negative for behavioral problems and confusion.      Allergies  Floxin; Penicillins; and Sulfa antibiotics  Home Medications   Prior to Admission medications   Medication Sig Start Date End Date Taking? Authorizing Provider  albuterol (PROVENTIL) (2.5 MG/3ML) 0.083% nebulizer solution Take 3 mLs (2.5 mg total) by nebulization every 4 (four) hours as needed for wheezing or shortness of breath. 04/02/14   Richarda OverlieNayana Abrol, MD  aspirin 81 MG tablet Take 81 mg by mouth daily.    Historical Provider, MD  benzonatate (TESSALON) 100 MG capsule Take 1 capsule (100 mg total) by mouth every 8 (eight) hours. 05/25/14   Rolland PorterMark Tonio, MD  doxycycline (VIBRAMYCIN) 100 MG capsule Take 1 capsule (100 mg total) by mouth 2 (two) times daily. 05/25/14   Rolland PorterMark Jaleil, MD  dutasteride (AVODART) 0.5 MG capsule Take 0.5 mg by mouth daily.    Historical Provider, MD  feeding supplement, ENSURE COMPLETE, (ENSURE COMPLETE) LIQD Take 237 mLs by mouth 2 (two) times daily between meals. 04/02/14   Richarda OverlieNayana Abrol, MD  guaiFENesin (MUCINEX) 600 MG 12 hr tablet Take 1 tablet (600 mg total) by mouth 2 (  two) times daily. 04/02/14   Richarda OverlieNayana Abrol, MD  lansoprazole (PREVACID) 30 MG capsule Take 30 mg by mouth daily at 12 noon.    Historical Provider, MD  LORazepam (ATIVAN) 2 MG/ML concentrated solution Take 0.5 mLs (1 mg total) by mouth every 6 (six) hours as needed for anxiety. 04/03/14   Leroy SeaPrashant K Singh, MD  Morphine Sulfate (MORPHINE CONCENTRATE) 10 mg / 0.5 ml concentrated solution Take 0.25 mLs (5 mg total) by mouth every 4 (four) hours as needed for severe pain or shortness of breath.  04/03/14   Leroy SeaPrashant K Singh, MD  pravastatin (PRAVACHOL) 40 MG tablet Take 40 mg by mouth daily.    Historical Provider, MD  predniSONE (DELTASONE) 20 MG tablet Take 1 tablet (20 mg total) by mouth daily with breakfast. 1 p bid x 5 days 05/25/14   Rolland PorterMark Lux, MD  Tamsulosin HCl (FLOMAX) 0.4 MG CAPS Take 0.4 mg by mouth daily.    Historical Provider, MD  traMADol-acetaminophen (ULTRACET) 37.5-325 MG per tablet Take 1 tablet by mouth every 8 (eight) hours as needed for moderate pain or severe pain. 04/03/14   Leroy SeaPrashant K Singh, MD  traZODone (DESYREL) 50 MG tablet Take 1 tablet (50 mg total) by mouth at bedtime as needed for sleep. 04/02/14   Richarda OverlieNayana Abrol, MD   BP 153/65 mmHg  Pulse 68  Temp(Src) 97.6 F (36.4 C) (Oral)  Resp 18  Ht 5\' 7"  (1.702 m)  Wt 140 lb (63.504 kg)  BMI 21.92 kg/m2  SpO2 90% Physical Exam  Constitutional: He is oriented to person, place, and time. He appears well-developed and well-nourished. No distress.  HENT:  Head: Normocephalic.  Eyes: Conjunctivae are normal. Pupils are equal, round, and reactive to light. No scleral icterus.  Neck: Normal range of motion. Neck supple. No thyromegaly present.  Cardiovascular: Normal rate and regular rhythm.  Exam reveals no gallop and no friction rub.   No murmur heard. Pulmonary/Chest: Effort normal and breath sounds normal. No respiratory distress. He has no wheezes. He has no rales.  Diffuse dry crackles. No focal diminished breath sounds. No decreased basilar breath sounds. Mild end for a wheeze with cough only.  Abdominal: Soft. Bowel sounds are normal. He exhibits no distension. There is no tenderness. There is no rebound.  Musculoskeletal: Normal range of motion.       Hands: Neurological: He is alert and oriented to person, place, and time.  Skin: Skin is warm and dry. No rash noted.  Psychiatric: He has a normal mood and affect. His behavior is normal.    ED Course  Procedures (including critical care time) Labs  Review Labs Reviewed - No data to display  Imaging Review Dg Chest 2 View  05/25/2014   CLINICAL DATA:  Cough.  Emphysema.  EXAM: CHEST  2 VIEW  COMPARISON:  04/16/2014  FINDINGS: Chronic interstitial lung disease is stable in appearance. Emphysema and right upper lobe scarring is also unchanged. No evidence of acute or superimposed infiltrate. No evidence of pleural effusion. Mild cardiomegaly is stable.  IMPRESSION: Stable chronic pulmonary disease and cardiomegaly. No acute findings.   Electronically Signed   By: Myles RosenthalJohn  Stahl M.D.   On: 05/25/2014 12:11   Dg Hand Complete Right  05/25/2014   CLINICAL DATA:  80 year old male with several week history of right middle finger pain  EXAM: RIGHT HAND - COMPLETE 3+ VIEW  COMPARISON:  None.  FINDINGS: No evidence of acute fracture or malalignment. No erosive arthropathy. Degenerative osteoarthritis is  present in the distal interphalangeal joints of the first, second, third and fifth digit with relative sparing of the fourth digit. Additionally, degenerative osteoarthritis present at the radioscaphoid joint and first and second CMC joints. Mild ulnar minus variance. Widening of the scapholunate interspace noted incidentally. The bones appear mildly osteopenic. Focal soft tissue swelling of the long finger. No retained radiopaque foreign body.  IMPRESSION: 1. Diffuse soft tissue swelling involving the long finger without evidence of fracture, malalignment or retained radiopaque foreign body. 2. Degenerative osteoarthritis of the DIP joints, radiocarpal joint and first and second CMC joints. 3. Widening of the scapholunate interspace suggests possible developing SLAC wrist 4. Mild ulnar minus variance.   Electronically Signed   By: Malachy Moan M.D.   On: 05/25/2014 12:12     EKG Interpretation None      MDM   Final diagnoses:  COPD exacerbation  Finger contusion, initial encounter    Patient has some limited range of motion of his finger with  soft tissue swelling. There is ecchymosis noted. He does not recall injury. Arthritis without obvious bony abnormality noted on x-ray. No clinical signs to suggest tendon injury. Plan is expectant management ice or arm soaks of the finger. Plan treatment for COPD exacerbation doxycycline, prednisone, albuterol inhaler.    Rolland Porter, MD 05/25/14 1324

## 2014-05-25 NOTE — Discharge Instructions (Signed)
Chronic Obstructive Pulmonary Disease Exacerbation  Chronic obstructive pulmonary disease (COPD) is a common lung problem. In COPD, the flow of air from the lungs is limited. COPD exacerbations are times that breathing gets worse and you need extra treatment. Without treatment they can be life threatening. If they happen often, your lungs can become more damaged. HOME CARE  Do not smoke.  Avoid tobacco smoke and other things that bother your lungs.  If given, take your antibiotic medicine as told. Finish the medicine even if you start to feel better.  Only take medicines as told by your doctor.  Drink enough fluids to keep your pee (urine) clear or pale yellow (unless your doctor has told you not to).  Use a cool mist machine (vaporizer).  If you use oxygen or a machine that turns liquid medicine into a mist (nebulizer), continue to use them as told.  Keep up with shots (vaccinations) as told by your doctor.  Exercise regularly.  Eat healthy foods.  Keep all doctor visits as told. GET HELP RIGHT AWAY IF:  You are very short of breath and it gets worse.  You have trouble talking.  You have bad chest pain.  You have blood in your spit (sputum).  You have a fever.  You keep throwing up (vomiting).  You feel weak, or you pass out (faint).  You feel confused.  You keep getting worse. MAKE SURE YOU:   Understand these instructions.  Will watch your condition.  Will get help right away if you are not doing well or get worse. Document Released: 05/20/2011 Document Revised: 03/21/2013 Document Reviewed: 02/02/2013 [facility] Patient Information 2015 [place], [place]. This information is not intended to replace advice given to you by your health care provider. Make sure you discuss any questions you have with your health care provider.  Contusion A contusion is a deep bruise. Contusions happen when an injury causes bleeding under the skin. Signs of bruising include pain,  puffiness (swelling), and discolored skin. The contusion may turn blue, purple, or yellow. HOME CARE   Put ice on the injured area.  Put ice in a plastic bag.  Place a towel between your skin and the bag.  Leave the ice on for 15-20 minutes, 03-04 times a day.  Only take medicine as told by your doctor.  Rest the injured area.  If possible, raise (elevate) the injured area to lessen puffiness. GET HELP RIGHT AWAY IF:   You have more bruising or puffiness.  You have pain that is getting worse.  Your puffiness or pain is not helped by medicine. MAKE SURE YOU:   Understand these instructions.  Will watch your condition.  Will get help right away if you are not doing well or get worse. Document Released: 11/17/2007 Document Revised: 08/23/2011 Document Reviewed: 04/05/2011 [facility] Patient Information 2015 [place], [place]. This information is not intended to replace advice given to you by your health care provider. Make sure you discuss any questions you have with your health care provider.

## 2014-05-26 IMAGING — CR DG CHEST 2V
2 series · 2 of 2 positions shown · non-contrast
Comparison: [DATE] and 05/17/2007

CLINICAL DATA: Shortness of breath.

CHEST - 2 VIEW

[w chest pa]
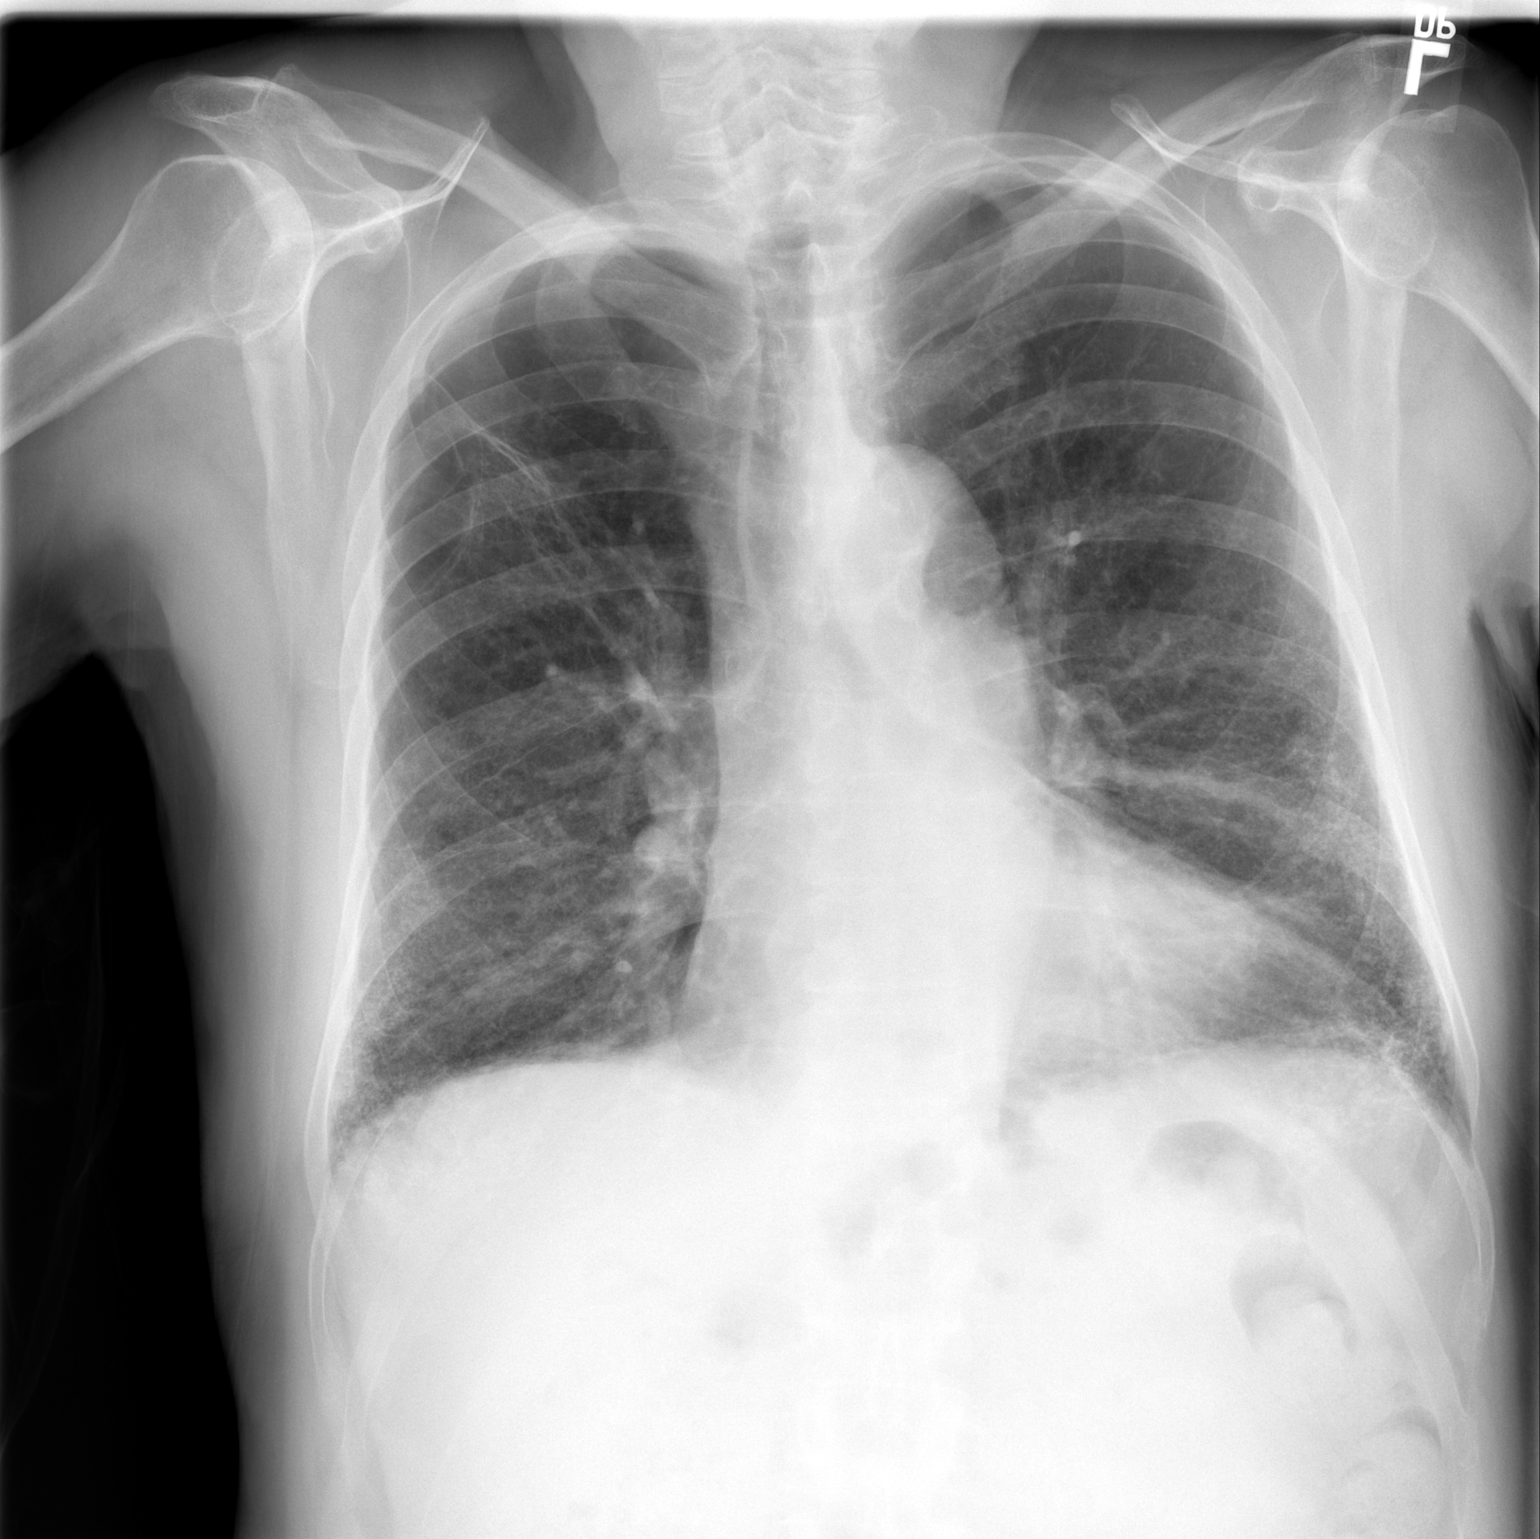

[w chest lat]
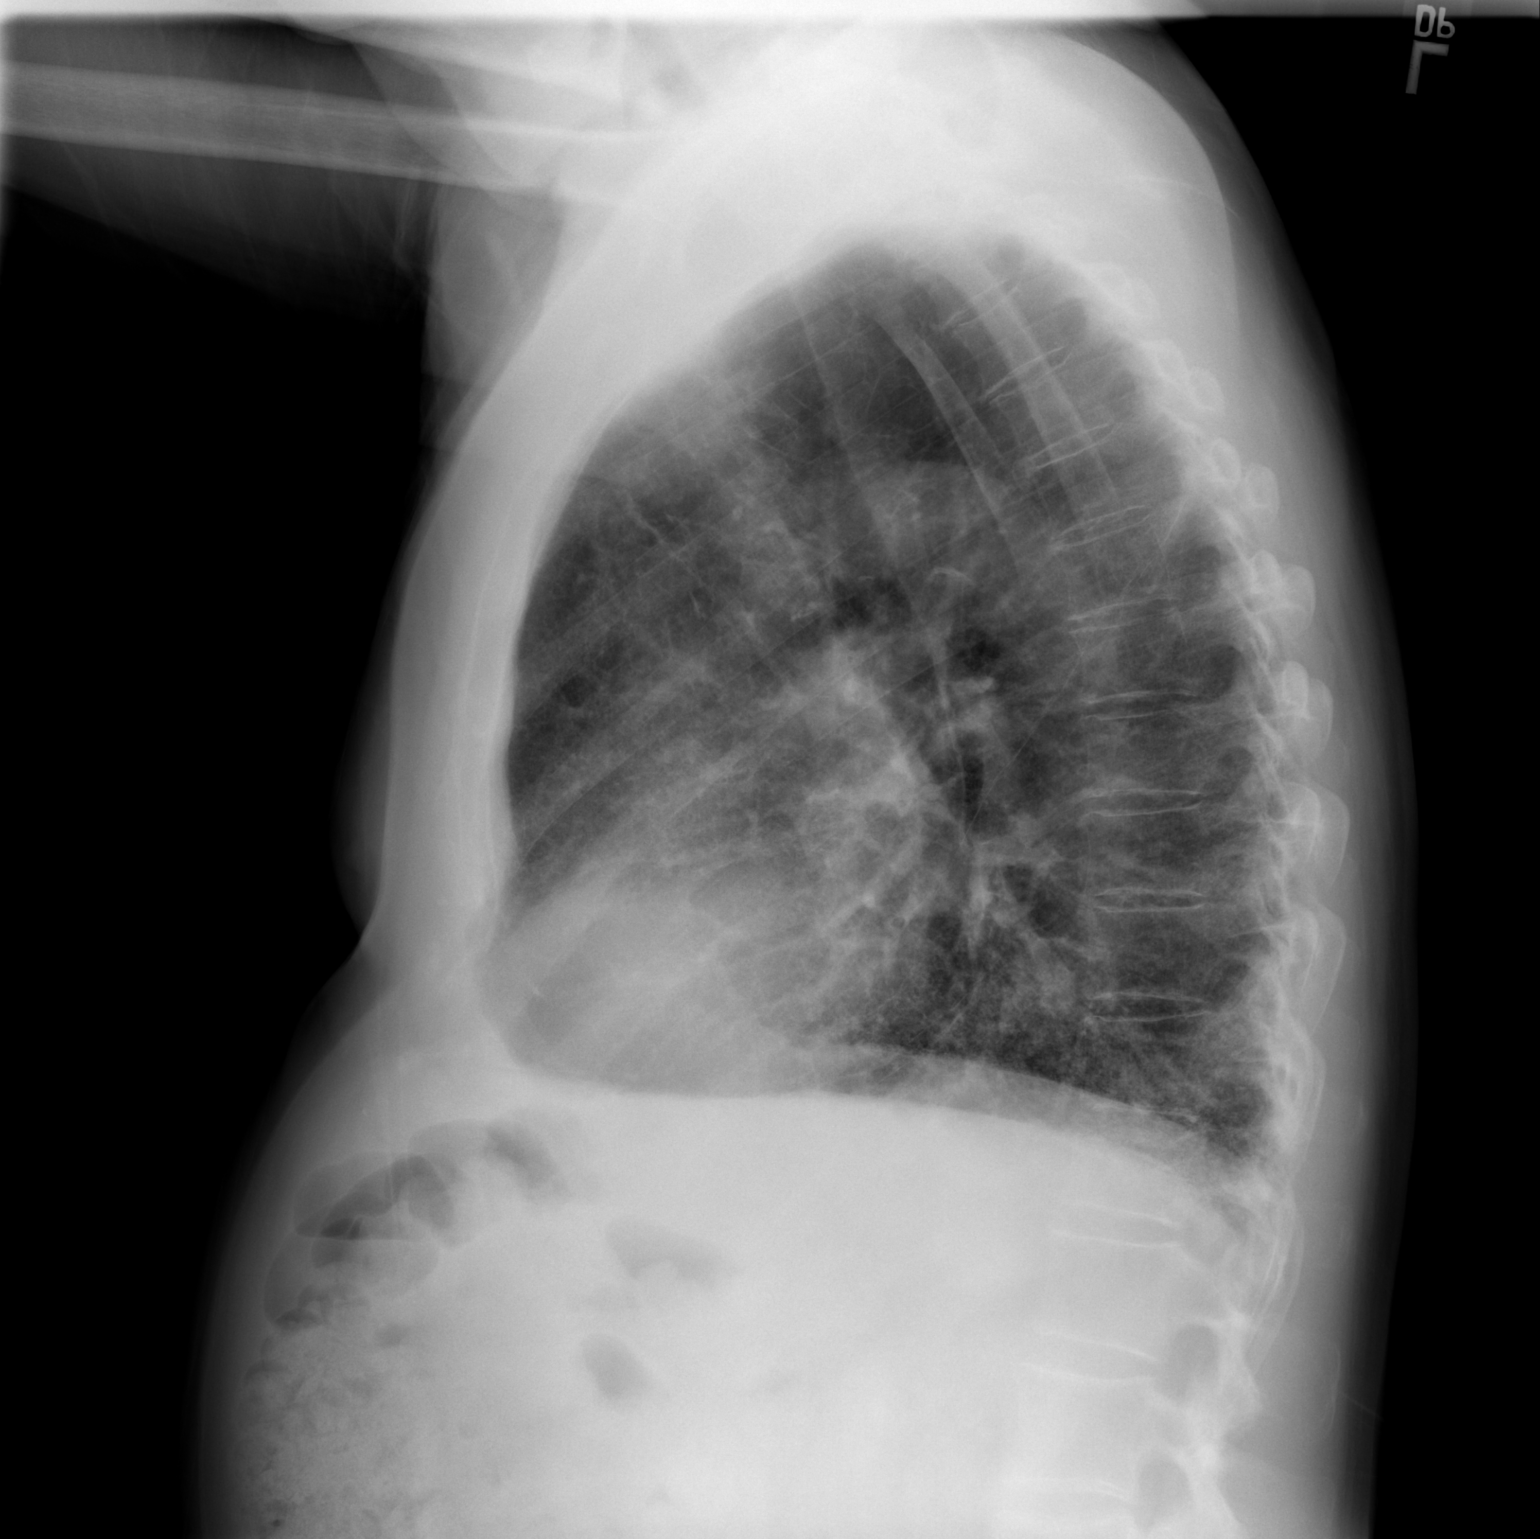

[2 of 2 positions shown; findings below may reference images not displayed]

FINDINGS: The patient has chronic interstitial and obstructive lung
disease with blebs at the right apex.  No acute infiltrates or
effusions.  Heart size and vascularity are normal.  No acute
osseous abnormality.
IMPRESSION: Prominent chronic interstitial and obstructive lung disease.

## 2014-06-04 ENCOUNTER — Telehealth: Payer: Self-pay | Admitting: Critical Care Medicine

## 2014-06-04 NOTE — Telephone Encounter (Signed)
Spoke with pt's daughter  She states that the pt's assisted living facility is recommending that pt should try a PT program  Daughter wanted PW's opinion before they enroll pt  Please advise thanks!

## 2014-06-05 NOTE — Telephone Encounter (Signed)
i am ok with pt getting PT

## 2014-06-05 NOTE — Telephone Encounter (Signed)
lmtcb for Linda.  

## 2014-06-05 NOTE — Telephone Encounter (Signed)
Spoke with pt's daughter, she is aware that [facility] is ok with pt starting a PT program.  States they do not need an order for PT at this time but will call if that changes.  Nothing further needed at this time.

## 2014-06-05 NOTE — Telephone Encounter (Signed)
Pt's dtr returned call - [phone]

## 2014-06-20 IMAGING — CT CT CHEST W/ CM
2 of 4 series · 15 of 36 positions shown, 18 images · IV contrast (Omnipaque 300)
Comparison: Plain film 03/09/2012.  The report of a CT of
05/28/1999 is reviewed.  Prior images not available.

CLINICAL DATA: Abnormal chest radiograph.  Shortness of breath and
unsteady gait.  Ex-smoker.

CT CHEST WITH CONTRAST
TECHNIQUE: Multidetector CT imaging of the chest was performed
following the standard protocol during bolus administration of
intravenous contrast.
Contrast: 80mL OMNIPAQUE IOHEXOL 300 MG/ML  SOLN

[Series 3: chest routine with · axial · 0.73mm/px · z∈[-282,-27]mm · 12 of 57 slices shown, 15 images]
[im 3/57  mediastinal]
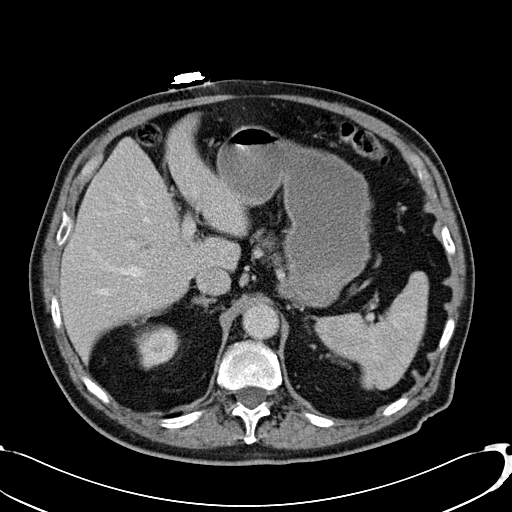
[im 3/57  lung]
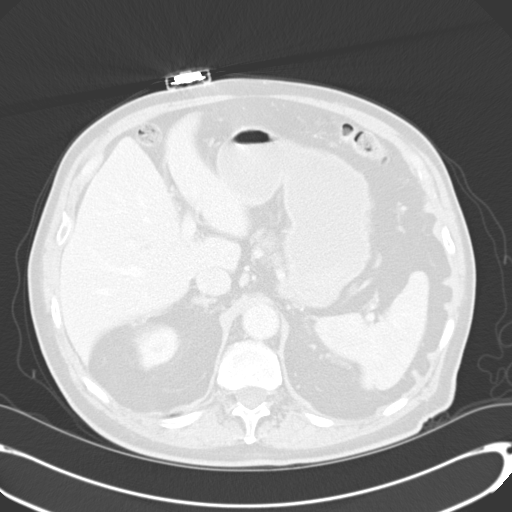
[im 8/57  lung]
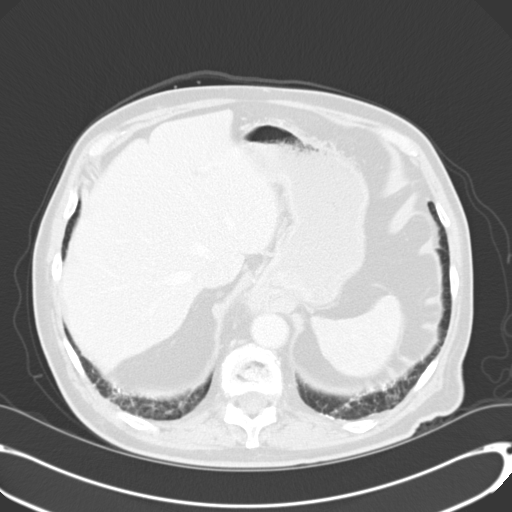
[im 13/57  lung]
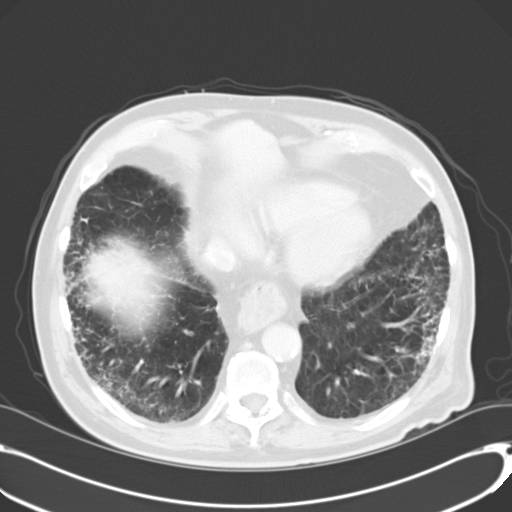
[im 18/57  lung]
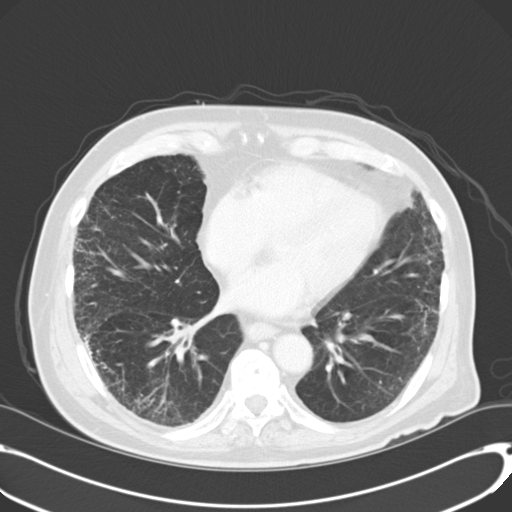
[im 21/57  mediastinal]
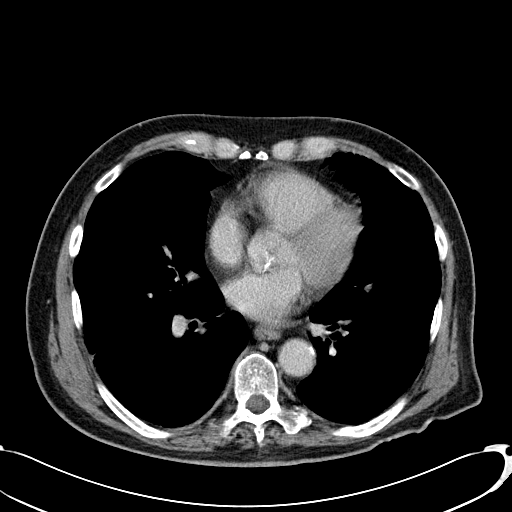
[im 21/57  lung]
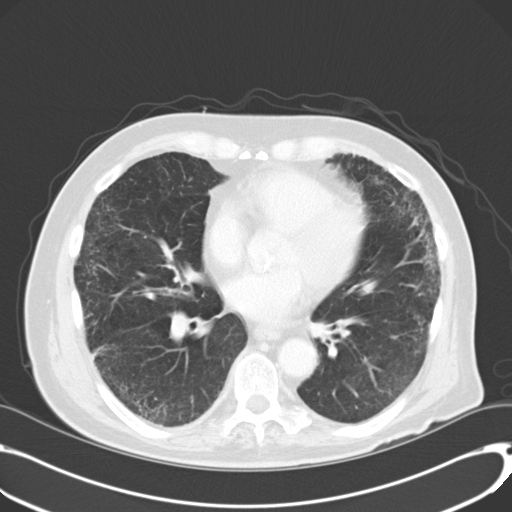
[im 26/57  lung]
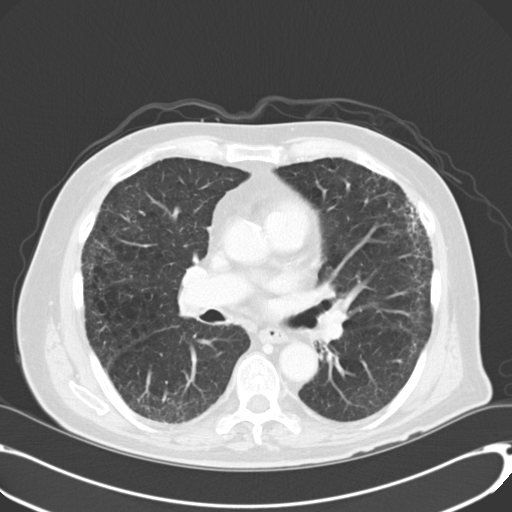
[im 31/57  lung]
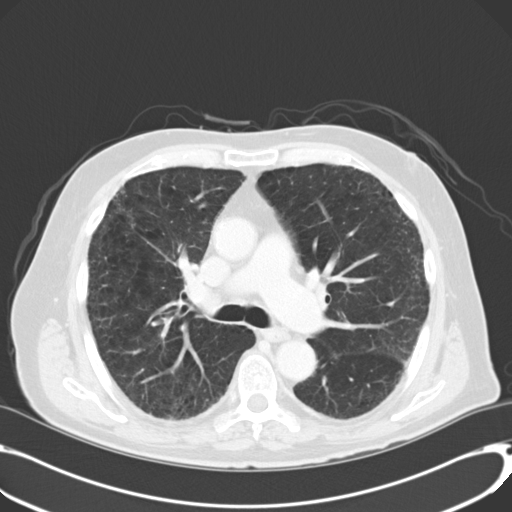
[im 36/57  lung]
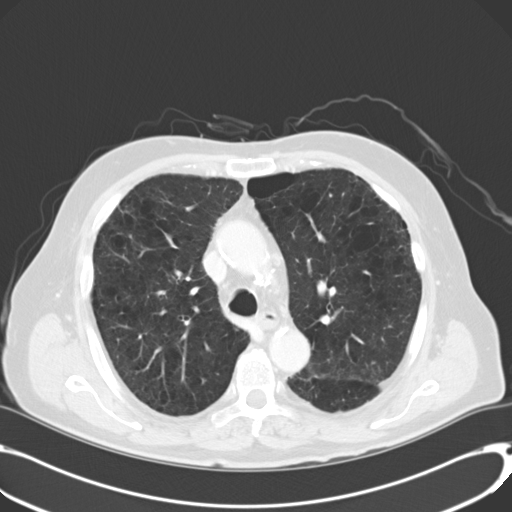
[im 39/57  mediastinal]
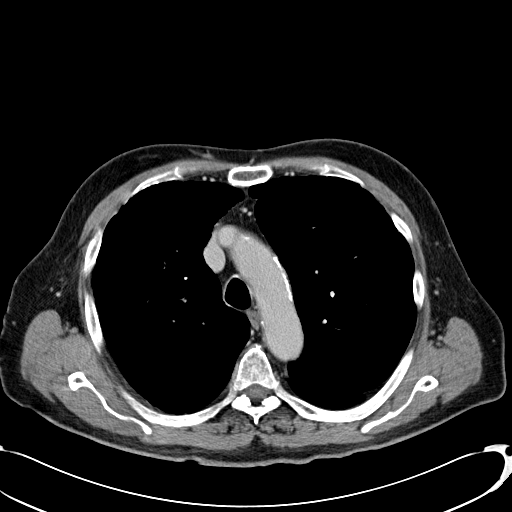
[im 39/57  lung]
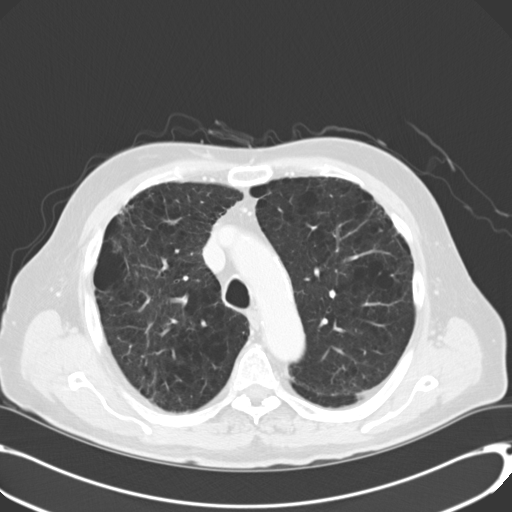
[im 44/57  lung]
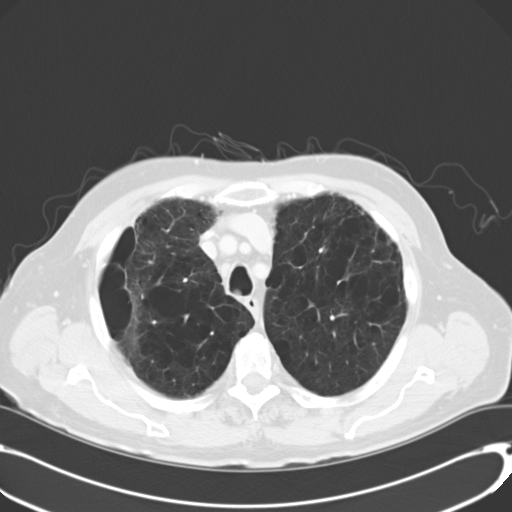
[im 49/57  lung]
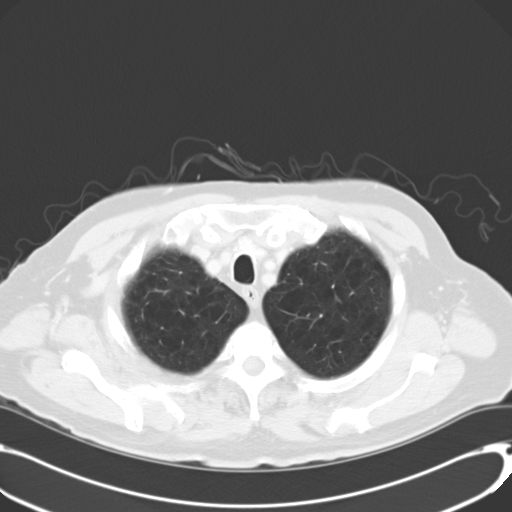
[im 54/57  lung]
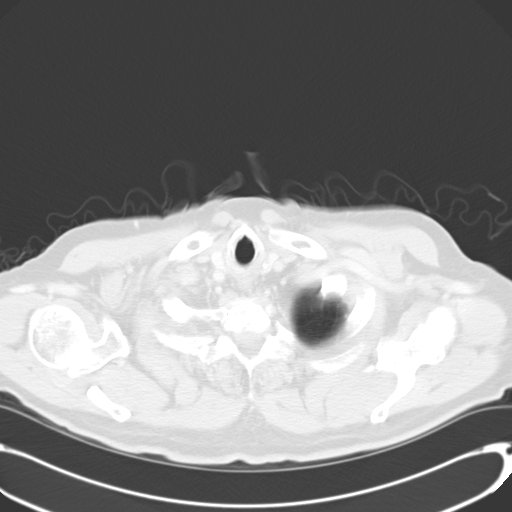

[Series 602: cor · coronal · 0.73mm/px · 3 of 109 slices shown]
[im 22/109  lung]
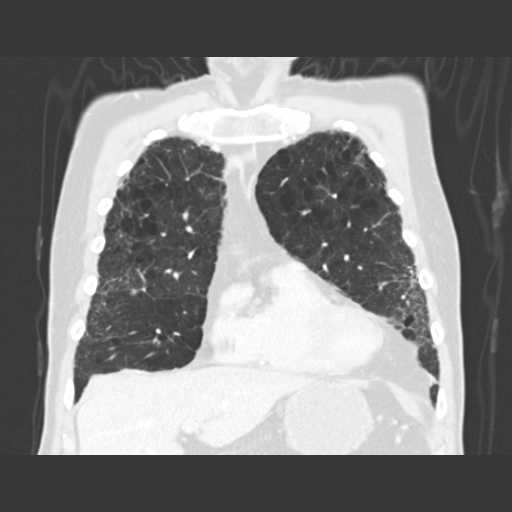
[im 44/109  lung]
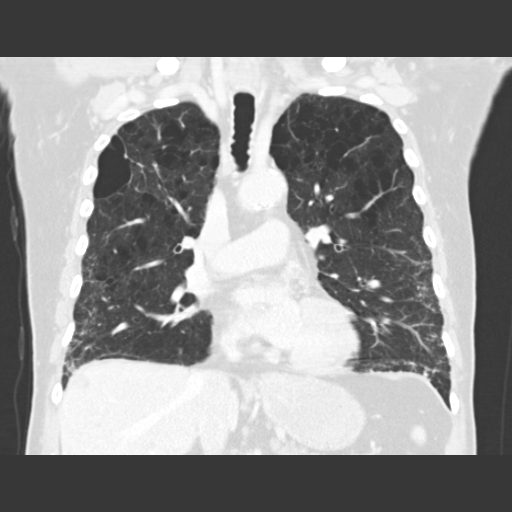
[im 65/109  lung]
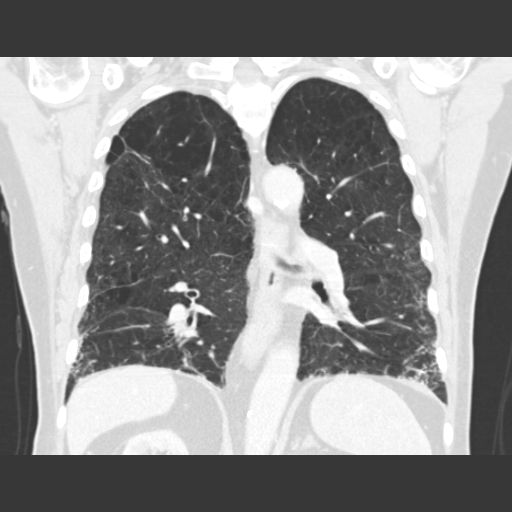

[15 of 36 positions shown; findings below may reference images not displayed]

FINDINGS: Lung windows demonstrate secretions within the dependent
trachea.  Minimal motion degradation, primarily inferiorly.

4 mm right lower lobe lung nodule on image 39.  A possible 4 mm
central right upper lobe lung nodule on image 26.  This could be a
branching vessel on end.

Moderate to severe upper lobe predominant centrilobular emphysema.
Interstitial thickening at the lung bases, slightly peripheral
predominant. No lobar consolidation.

Soft tissue windows demonstrate mild cardiomegaly, without
pericardial or pleural effusion.  Pulmonary artery enlargement,
with the right pulmonary artery measuring 2.9 cm. No central
pulmonary embolism, on this non-dedicated study.  Increased number
of small mediastinal nodes, none of which meet CT size criteria for
pathologic enlargement.  No hilar adenopathy.  Moderate hiatal
hernia.

Limited abdominal imaging demonstrates no significant findings.
Mild superior endplate compression deformity at the T12 vertebral
body is chronic and causes minimal ventral canal encroachment.
Example sagittal image 72.
IMPRESSION: 1.  Moderate to marked centrilobular emphysema.
2.  Bibasilar interstitial lung disease.  Favor a somewhat atypical
appearance of usual interstitial pneumonitis/pulmonary fibrosis.
Chronic aspiration could look similar.
3. Pulmonary artery enlargement suggests pulmonary arterial
hypertension.
4.  4 mm right lower lobe and possible 4 mm right upper lobe lung
nodules. Given risk factors for bronchogenic carcinoma, follow-up
chest CT at 1 year is recommended.  This recommendation follows the
consensus statement:  "Guidelines for Management of Small Pulmonary
Nodules Detected on CT Scans:  A Statement from the [place]
online at:  [URL]
5.  Moderate hiatal hernia.

6.  Mild nonacute superior endplate compression deformity T12 with
minimal canal encroachment.

## 2014-08-06 ENCOUNTER — Encounter (HOSPITAL_COMMUNITY): Payer: Self-pay | Admitting: Emergency Medicine

## 2014-08-06 ENCOUNTER — Emergency Department (HOSPITAL_COMMUNITY): Payer: Medicare Other

## 2014-08-06 ENCOUNTER — Inpatient Hospital Stay (HOSPITAL_COMMUNITY)
Admission: EM | Admit: 2014-08-06 | Discharge: 2014-08-09 | DRG: 190 | Disposition: A | Discharge status: Nursing Home (Includes ICF) | Payer: Medicare Other | Attending: Internal Medicine | Admitting: Internal Medicine

## 2014-08-06 DIAGNOSIS — J9621 Acute and chronic respiratory failure with hypoxia: Secondary | ICD-10-CM | POA: Diagnosis present

## 2014-08-06 DIAGNOSIS — I272 Other secondary pulmonary hypertension: Secondary | ICD-10-CM | POA: Diagnosis present

## 2014-08-06 DIAGNOSIS — Z993 Dependence on wheelchair: Secondary | ICD-10-CM

## 2014-08-06 DIAGNOSIS — Z79891 Long term (current) use of opiate analgesic: Secondary | ICD-10-CM

## 2014-08-06 DIAGNOSIS — Z880 Allergy status to penicillin: Secondary | ICD-10-CM | POA: Diagnosis not present

## 2014-08-06 DIAGNOSIS — K219 Gastro-esophageal reflux disease without esophagitis: Secondary | ICD-10-CM | POA: Diagnosis present

## 2014-08-06 DIAGNOSIS — E780 Pure hypercholesterolemia: Secondary | ICD-10-CM | POA: Diagnosis present

## 2014-08-06 DIAGNOSIS — R0600 Dyspnea, unspecified: Secondary | ICD-10-CM

## 2014-08-06 DIAGNOSIS — Z79899 Other long term (current) drug therapy: Secondary | ICD-10-CM | POA: Diagnosis not present

## 2014-08-06 DIAGNOSIS — I350 Nonrheumatic aortic (valve) stenosis: Secondary | ICD-10-CM | POA: Diagnosis present

## 2014-08-06 DIAGNOSIS — I270 Primary pulmonary hypertension: Secondary | ICD-10-CM

## 2014-08-06 DIAGNOSIS — Z9981 Dependence on supplemental oxygen: Secondary | ICD-10-CM | POA: Diagnosis not present

## 2014-08-06 DIAGNOSIS — E785 Hyperlipidemia, unspecified: Secondary | ICD-10-CM | POA: Diagnosis present

## 2014-08-06 DIAGNOSIS — Z7982 Long term (current) use of aspirin: Secondary | ICD-10-CM | POA: Diagnosis not present

## 2014-08-06 DIAGNOSIS — R197 Diarrhea, unspecified: Secondary | ICD-10-CM | POA: Diagnosis present

## 2014-08-06 DIAGNOSIS — Z87891 Personal history of nicotine dependence: Secondary | ICD-10-CM

## 2014-08-06 DIAGNOSIS — Z515 Encounter for palliative care: Secondary | ICD-10-CM

## 2014-08-06 DIAGNOSIS — J849 Interstitial pulmonary disease, unspecified: Secondary | ICD-10-CM | POA: Diagnosis not present

## 2014-08-06 DIAGNOSIS — J439 Emphysema, unspecified: Secondary | ICD-10-CM | POA: Diagnosis present

## 2014-08-06 DIAGNOSIS — I10 Essential (primary) hypertension: Secondary | ICD-10-CM | POA: Diagnosis present

## 2014-08-06 DIAGNOSIS — J441 Chronic obstructive pulmonary disease with (acute) exacerbation: Secondary | ICD-10-CM

## 2014-08-06 DIAGNOSIS — Z66 Do not resuscitate: Secondary | ICD-10-CM | POA: Diagnosis present

## 2014-08-06 DIAGNOSIS — R0902 Hypoxemia: Secondary | ICD-10-CM

## 2014-08-06 DIAGNOSIS — J84112 Idiopathic pulmonary fibrosis: Secondary | ICD-10-CM | POA: Diagnosis present

## 2014-08-06 DIAGNOSIS — N400 Enlarged prostate without lower urinary tract symptoms: Secondary | ICD-10-CM | POA: Diagnosis present

## 2014-08-06 DIAGNOSIS — R0602 Shortness of breath: Secondary | ICD-10-CM

## 2014-08-06 DIAGNOSIS — Z8701 Personal history of pneumonia (recurrent): Secondary | ICD-10-CM

## 2014-08-06 DIAGNOSIS — Z881 Allergy status to other antibiotic agents status: Secondary | ICD-10-CM

## 2014-08-06 DIAGNOSIS — Z8673 Personal history of transient ischemic attack (TIA), and cerebral infarction without residual deficits: Secondary | ICD-10-CM | POA: Diagnosis not present

## 2014-08-06 LAB — COMPREHENSIVE METABOLIC PANEL
ALT: 16 U/L (ref 0–53)
AST: 25 U/L (ref 0–37)
Albumin: 3.6 g/dL (ref 3.5–5.2)
Alkaline Phosphatase: 61 U/L (ref 39–117)
Anion gap: 8 (ref 5–15)
BUN: 24 mg/dL — ABNORMAL HIGH (ref 6–23)
CALCIUM: 9 mg/dL (ref 8.4–10.5)
CHLORIDE: 108 mmol/L (ref 96–112)
CO2: 25 mmol/L (ref 19–32)
CREATININE: 1.03 mg/dL (ref 0.50–1.35)
GFR, EST AFRICAN AMERICAN: 73 mL/min — AB (ref >90)
GFR, EST NON AFRICAN AMERICAN: 63 mL/min — AB (ref >90)
GLUCOSE: 124 mg/dL — AB (ref 70–99)
Potassium: 4.1 mmol/L (ref 3.5–5.1)
Sodium: 141 mmol/L (ref 135–145)
Total Bilirubin: 0.3 mg/dL (ref 0.3–1.2)
Total Protein: 6.9 g/dL (ref 6.0–8.3)

## 2014-08-06 LAB — CBG MONITORING, ED: GLUCOSE-CAPILLARY: 127 mg/dL — AB (ref 70–99)

## 2014-08-06 LAB — CBC WITH DIFFERENTIAL/PLATELET
BASOS PCT: 0 % (ref 0–1)
Basophils Absolute: 0 10*3/uL (ref 0.0–0.1)
EOS PCT: 5 % (ref 0–5)
Eosinophils Absolute: 0.4 10*3/uL (ref 0.0–0.7)
HEMATOCRIT: 43.3 % (ref 39.0–52.0)
Hemoglobin: 12.7 g/dL — ABNORMAL LOW (ref 13.0–17.0)
Lymphocytes Relative: 16 % (ref 12–46)
Lymphs Abs: 1.3 10*3/uL (ref 0.7–4.0)
MCH: 22 pg — AB (ref 26.0–34.0)
MCHC: 29.3 g/dL — ABNORMAL LOW (ref 30.0–36.0)
MCV: 74.9 fL — ABNORMAL LOW (ref 78.0–100.0)
Monocytes Absolute: 1.1 10*3/uL — ABNORMAL HIGH (ref 0.1–1.0)
Monocytes Relative: 14 % — ABNORMAL HIGH (ref 3–12)
Neutro Abs: 5.4 10*3/uL (ref 1.7–7.7)
Neutrophils Relative %: 65 % (ref 43–77)
Platelets: 264 10*3/uL (ref 150–400)
RBC: 5.78 MIL/uL (ref 4.22–5.81)
RDW: 18.2 % — ABNORMAL HIGH (ref 11.5–15.5)
WBC: 8.2 10*3/uL (ref 4.0–10.5)

## 2014-08-06 LAB — BLOOD GAS, ARTERIAL
ACID-BASE DEFICIT: 0.9 mmol/L (ref 0.0–2.0)
Bicarbonate: 23.1 mEq/L (ref 20.0–24.0)
Drawn by: 307971
O2 CONTENT: 8 L/min
O2 Saturation: 92.6 %
Patient temperature: 98.6
TCO2: 20.7 mmol/L (ref 0–100)
pCO2 arterial: 38.1 mmHg (ref 35.0–45.0)
pH, Arterial: 7.399 (ref 7.350–7.450)
pO2, Arterial: 69.1 mmHg — ABNORMAL LOW (ref 80.0–100.0)

## 2014-08-06 LAB — TROPONIN I: TROPONIN I: 0.03 ng/mL (ref <0.031)

## 2014-08-06 LAB — BRAIN NATRIURETIC PEPTIDE: B Natriuretic Peptide: 77.7 pg/mL (ref 0.0–100.0)

## 2014-08-06 LAB — I-STAT CG4 LACTIC ACID, ED: LACTIC ACID, VENOUS: 2 mmol/L (ref 0.5–2.0)

## 2014-08-06 MED ORDER — ONDANSETRON HCL 4 MG PO TABS: 4.0000 mg | ORAL_TABLET | Freq: Four times a day (QID) | ORAL | Status: DC | PRN

## 2014-08-06 MED ORDER — ENSURE COMPLETE PO LIQD
237.0000 mL | Freq: Two times a day (BID) | ORAL | Status: DC
  Administered 2014-08-07: 237 mL via ORAL

## 2014-08-06 MED ORDER — PRAVASTATIN SODIUM 40 MG PO TABS
40.0000 mg | ORAL_TABLET | Freq: Every day | ORAL | Status: DC
  Administered 2014-08-06 – 2014-08-09 (×4): 40 mg via ORAL
  Filled 2014-08-06 (×3): qty 1
  Filled 2014-08-06: qty 2

## 2014-08-06 MED ORDER — ACETAMINOPHEN 325 MG PO TABS: 650.0000 mg | ORAL_TABLET | Freq: Four times a day (QID) | ORAL | Status: DC | PRN

## 2014-08-06 MED ORDER — ASPIRIN 81 MG PO CHEW
81.0000 mg | CHEWABLE_TABLET | Freq: Every day | ORAL | Status: DC
  Administered 2014-08-07 – 2014-08-09 (×3): 81 mg via ORAL
  Filled 2014-08-06 (×3): qty 1

## 2014-08-06 MED ORDER — ALBUTEROL SULFATE (2.5 MG/3ML) 0.083% IN NEBU: 2.5000 mg | INHALATION_SOLUTION | RESPIRATORY_TRACT | Status: DC | PRN

## 2014-08-06 MED ORDER — METHYLPREDNISOLONE SODIUM SUCC 125 MG IJ SOLR
125.0000 mg | Freq: Once | INTRAMUSCULAR | Status: AC
Start: 2014-08-06 — End: 2014-08-06
  Administered 2014-08-06: 125 mg via INTRAVENOUS
  Filled 2014-08-06: qty 2

## 2014-08-06 MED ORDER — ONDANSETRON HCL 4 MG/2ML IJ SOLN: 4.0000 mg | Freq: Four times a day (QID) | INTRAMUSCULAR | Status: DC | PRN

## 2014-08-06 MED ORDER — SODIUM CHLORIDE 0.9 % IJ SOLN
3.0000 mL | Freq: Two times a day (BID) | INTRAMUSCULAR | Status: DC
  Administered 2014-08-07 – 2014-08-09 (×3): 3 mL via INTRAVENOUS

## 2014-08-06 MED ORDER — FINASTERIDE 5 MG PO TABS
5.0000 mg | ORAL_TABLET | Freq: Every day | ORAL | Status: DC
  Administered 2014-08-07: 5 mg via ORAL
  Filled 2014-08-06: qty 1

## 2014-08-06 MED ORDER — ALBUTEROL (5 MG/ML) CONTINUOUS INHALATION SOLN
10.0000 mg/h | INHALATION_SOLUTION | Freq: Once | RESPIRATORY_TRACT | Status: AC
  Administered 2014-08-06: 10 mg/h via RESPIRATORY_TRACT
  Filled 2014-08-06: qty 20

## 2014-08-06 MED ORDER — ACETAMINOPHEN 650 MG RE SUPP: 650.0000 mg | Freq: Four times a day (QID) | RECTAL | Status: DC | PRN

## 2014-08-06 MED ORDER — IPRATROPIUM-ALBUTEROL 0.5-2.5 (3) MG/3ML IN SOLN: 3.0000 mL | Freq: Four times a day (QID) | RESPIRATORY_TRACT | Status: DC

## 2014-08-06 MED ORDER — ALBUTEROL SULFATE HFA 108 (90 BASE) MCG/ACT IN AERS: 1.0000 | INHALATION_SPRAY | Freq: Four times a day (QID) | RESPIRATORY_TRACT | Status: DC | PRN

## 2014-08-06 MED ORDER — GUAIFENESIN ER 600 MG PO TB12
600.0000 mg | ORAL_TABLET | Freq: Two times a day (BID) | ORAL | Status: DC
  Administered 2014-08-06 – 2014-08-09 (×6): 600 mg via ORAL
  Filled 2014-08-06 (×7): qty 1

## 2014-08-06 MED ORDER — TIOTROPIUM BROMIDE MONOHYDRATE 18 MCG IN CAPS
18.0000 ug | ORAL_CAPSULE | Freq: Every day | RESPIRATORY_TRACT | Status: DC
  Administered 2014-08-07 – 2014-08-09 (×3): 18 ug via RESPIRATORY_TRACT
  Filled 2014-08-06: qty 5

## 2014-08-06 MED ORDER — DEXTROSE 5 % IV SOLN
1.0000 g | Freq: Once | INTRAVENOUS | Status: AC
  Administered 2014-08-06: 1 g via INTRAVENOUS
  Filled 2014-08-06: qty 10

## 2014-08-06 MED ORDER — DUTASTERIDE 0.5 MG PO CAPS
0.5000 mg | ORAL_CAPSULE | Freq: Every day | ORAL | Status: DC
  Administered 2014-08-07 – 2014-08-09 (×3): 0.5 mg via ORAL
  Filled 2014-08-06 (×3): qty 1

## 2014-08-06 MED ORDER — TAMSULOSIN HCL 0.4 MG PO CAPS
0.4000 mg | ORAL_CAPSULE | Freq: Every day | ORAL | Status: DC
  Administered 2014-08-06 – 2014-08-09 (×4): 0.4 mg via ORAL
  Filled 2014-08-06 (×4): qty 1

## 2014-08-06 MED ORDER — PANTOPRAZOLE SODIUM 40 MG PO TBEC
40.0000 mg | DELAYED_RELEASE_TABLET | Freq: Every day | ORAL | Status: DC
  Administered 2014-08-07 – 2014-08-09 (×3): 40 mg via ORAL
  Filled 2014-08-06 (×3): qty 1

## 2014-08-06 MED ORDER — TRAMADOL-ACETAMINOPHEN 37.5-325 MG PO TABS: 1.0000 | ORAL_TABLET | Freq: Three times a day (TID) | ORAL | Status: DC | PRN

## 2014-08-06 MED ORDER — HEPARIN SODIUM (PORCINE) 5000 UNIT/ML IJ SOLN
5000.0000 [IU] | Freq: Three times a day (TID) | INTRAMUSCULAR | Status: DC
  Administered 2014-08-06 – 2014-08-09 (×7): 5000 [IU] via SUBCUTANEOUS
  Filled 2014-08-06 (×11): qty 1

## 2014-08-06 MED ORDER — METHYLPREDNISOLONE SODIUM SUCC 125 MG IJ SOLR
60.0000 mg | INTRAMUSCULAR | Status: DC
  Administered 2014-08-06 – 2014-08-08 (×3): 60 mg via INTRAVENOUS
  Filled 2014-08-06: qty 0.96
  Filled 2014-08-06 (×2): qty 2
  Filled 2014-08-06: qty 0.96

## 2014-08-06 NOTE — ED Notes (Signed)
Initial contact: Pt alert, oriented to person, place, and time. Pt states he doesn't know why they brought him to the hospital. Pt's O2 99% on non-rebreather. Pt denies any pain. Lung sounds clear and diminished throughout. Respirations non-labored.

## 2014-08-06 NOTE — Progress Notes (Signed)
Attempted to call ED for report, was advised RN would have to call me back.  When ready to give report I can be reached at [phone].  Nickola MajorHans Javeion Cannedy,RN,BSN,CCRN

## 2014-08-06 NOTE — ED Notes (Signed)
[facility] Staff noticed mental status changes onset yesterday. Pt A&O x 4 at baseline, today A&O x 1. Pt on 4 L [place] at all times; upon EMS arrival O2 79% on ; with non-rebreather O2 up to 97% and became A&O x 4. Denies any pain or discomfort. Lung sounds slightly diminished in lower fields, clear. Pt has DNR.

## 2014-08-06 NOTE — ED Notes (Signed)
Bed: WA17 Expected date:  Expected time:  Means of arrival:  Comments: Ems- elderly, resp distress, no 100%

## 2014-08-06 NOTE — ED Notes (Signed)
Pt stated he is unable to give a urine specimen at this time.

## 2014-08-06 NOTE — ED Provider Notes (Signed)
CSN: [id number]     Arrival date & time 08/06/14  1714 History   First MD Initiated Contact with Patient 08/06/14 1721     Chief Complaint  Patient presents with  . Altered Mental Status     (Consider location/radiation/quality/duration/timing/severity/associated sxs/prior Treatment) HPI Comments: 81 year old male with COPD, stroke, interstitial lung disease, pulmonary hypertension, aortic stenosis, high blood pressure, nursing home, pneumonia history, palliative care patient history presents with worsening shortness of breath and cough this morning. This is similar to multiple episode in the past. On my exam patient denies lung disease however admits to having 4 L nasal cannula throughout the day. Medical records reveal significant lung disease history. Patient is been requiring increased oxygen throughout the day. Mild confusion recently no fevers or chills, nothing improves his shortness of breath. Patient denies any cardiac history, no chest pain. No recent surgeries, no blood clot history, no unilateral leg symptoms, no active cancer.  The history is provided by the patient and medical records.    Past Medical History  Diagnosis Date  . Hypertension   . Hypercholesteremia   . BPH (benign prostatic hyperplasia)   . ILD (interstitial lung disease)   . COPD, moderate   . Stroke     Thalamic   Past Surgical History  Procedure Laterality Date  . Cataract extraction Bilateral   . Appendectomy    . Hernia repair    . Flexible sigmoidoscopy N/A 04/24/2013    Procedure: FLEXIBLE SIGMOIDOSCOPY;  Surgeon: Shirley Friar, MD;  Location[facility] ENDOSCOPY;  Service: Endoscopy;  Laterality: N/A;   No family history on file. History  Substance Use Topics  . Smoking status: Former Smoker -- 1.00 packs/day for 40 years    Types: Cigarettes    Quit date: 06/15/1971  . Smokeless tobacco: Never Used  . Alcohol Use: Yes     Comment: sparingly    Review of Systems  Constitutional:  Negative for fever and chills.  HENT: Negative for congestion.   Eyes: Negative for visual disturbance.  Respiratory: Positive for cough and shortness of breath.   Cardiovascular: Negative for chest pain.  Gastrointestinal: Negative for vomiting and abdominal pain.  Genitourinary: Negative for dysuria and flank pain.  Musculoskeletal: Negative for back pain, neck pain and neck stiffness.  Skin: Negative for rash.  Neurological: Negative for light-headedness and headaches.      Allergies  Floxin; Penicillins; and Sulfa antibiotics  Home Medications   Prior to Admission medications   Medication Sig Start Date End Date Taking? Authorizing Provider  albuterol (PROVENTIL HFA;VENTOLIN HFA) 108 (90 BASE) MCG/ACT inhaler Inhale 1 puff into the lungs every 6 (six) hours as needed for wheezing or shortness of breath.   Yes Historical Provider, MD  albuterol (PROVENTIL) (2.5 MG/3ML) 0.083% nebulizer solution Take 3 mLs (2.5 mg total) by nebulization every 4 (four) hours as needed for wheezing or shortness of breath. 04/02/14  Yes Richarda Overlie, MD  aspirin 81 MG tablet Take 81 mg by mouth daily.   Yes Historical Provider, MD  dutasteride (AVODART) 0.5 MG capsule Take 0.5 mg by mouth daily.   Yes Historical Provider, MD  feeding supplement, ENSURE COMPLETE, (ENSURE COMPLETE) LIQD Take 237 mLs by mouth 2 (two) times daily between meals. 04/02/14  Yes Richarda Overlie, MD  finasteride (PROSCAR) 5 MG tablet Take 5 mg by mouth daily.   Yes Historical Provider, MD  lansoprazole (PREVACID) 30 MG capsule Take 30 mg by mouth daily at 12 noon.   Yes Historical Provider,  MD  pravastatin (PRAVACHOL) 40 MG tablet Take 40 mg by mouth daily.   Yes Historical Provider, MD  Tamsulosin HCl (FLOMAX) 0.4 MG CAPS Take 0.4 mg by mouth daily.   Yes Historical Provider, MD  tiotropium (SPIRIVA) 18 MCG inhalation capsule Place 18 mcg into inhaler and inhale daily.   Yes Historical Provider, MD  traMADol-acetaminophen  (ULTRACET) 37.5-325 MG per tablet Take 1 tablet by mouth every 8 (eight) hours as needed for moderate pain or severe pain. 04/03/14  Yes Leroy SeaPrashant K Singh, MD  benzonatate (TESSALON) 100 MG capsule Take 1 capsule (100 mg total) by mouth every 8 (eight) hours. Patient not taking: Reported on 08/06/2014 05/25/14   Rolland PorterMark Hilary, MD  doxycycline (VIBRAMYCIN) 100 MG capsule Take 1 capsule (100 mg total) by mouth 2 (two) times daily. Patient not taking: Reported on 08/06/2014 05/25/14   Rolland PorterMark Essex, MD  guaiFENesin (MUCINEX) 600 MG 12 hr tablet Take 1 tablet (600 mg total) by mouth 2 (two) times daily. Patient not taking: Reported on 08/06/2014 04/02/14   Richarda OverlieNayana Abrol, MD  LORazepam (ATIVAN) 2 MG/ML concentrated solution Take 0.5 mLs (1 mg total) by mouth every 6 (six) hours as needed for anxiety. Patient not taking: Reported on 08/06/2014 04/03/14   Leroy SeaPrashant K Singh, MD  Morphine Sulfate (MORPHINE CONCENTRATE) 10 mg / 0.5 ml concentrated solution Take 0.25 mLs (5 mg total) by mouth every 4 (four) hours as needed for severe pain or shortness of breath. Patient not taking: Reported on 08/06/2014 04/03/14   Leroy SeaPrashant K Singh, MD  predniSONE (DELTASONE) 20 MG tablet Take 1 tablet (20 mg total) by mouth daily with breakfast. 1 p bid x 5 days Patient not taking: Reported on 08/06/2014 05/25/14   Rolland PorterMark Eldwin, MD  traZODone (DESYREL) 50 MG tablet Take 1 tablet (50 mg total) by mouth at bedtime as needed for sleep. Patient not taking: Reported on 08/06/2014 04/02/14   Richarda OverlieNayana Abrol, MD   BP 152/55 mmHg  Pulse 91  Temp(Src) 97.5 F (36.4 C) (Oral)  Resp 27  SpO2 86% Physical Exam  Constitutional: He is oriented to person, place, and time. He appears well-developed and well-nourished.  HENT:  Head: Normocephalic and atraumatic.  Eyes: Right eye exhibits no discharge. Left eye exhibits no discharge.  Neck: Normal range of motion. Neck supple. No tracheal deviation present.  Cardiovascular: Normal rate and regular  rhythm.   Pulmonary/Chest: Effort normal. He has wheezes (decrease breath sounds bilateral, mild end expiratory wheeze, tachypnea).  Abdominal: Soft. He exhibits no distension. There is no tenderness. There is no guarding.  Musculoskeletal: He exhibits no edema.  Neurological: He is alert and oriented to person, place, and time. GCS eye subscore is 4. GCS verbal subscore is 5. GCS motor subscore is 6.  Subtle confusion on exam regarding medical history and random mother questions, patient alert and oriented to place name date of birth. Patient moves all extremity is equal bilateral, pupils equal, neck supple.  Skin: Skin is warm. No rash noted.  Psychiatric:  Patient no city, different items, name, date of birth. Patient has very subtle confusion during discussion especially regarding his medical history.  Nursing note and vitals reviewed.   ED Course  Procedures (including critical care time) Emergency Ultrasound: Limited Thoracic Performed and interpreted by Dr Jodi MourningZavitz Longitudinal view of anterior left and right lung fields in real-time with linear probe. Indication: dyspnea Findings: pos lung sliding very mild bilateral B lines Interpretation: no evidence of pneumothorax. Images electronically archived.  EMERGENCY DEPARTMENT [place] CARDIAC EXAM "Study: Limited Ultrasound of the heart and pericardium"  INDICATIONS:Dyspnea Multiple views of the heart and pericardium were obtained in real-time with a multi-frequency probe.  PERFORMED ZO:XWRUEA  IMAGES ARCHIVED?: Yes  FINDINGS: No pericardial effusion, Normal contractility, IVC normal and Tamponade physiology absent   VIEWS USED: Subcostal 4 chamber, Parasternal long axis, Parasternal short axis, Apical 4 chamber  and Inferior Vena Cava  INTERPRETATION: Cardiac activity present, Pericardial effusioin absent, Cardiac tamponade absent, Volume status normal and Normal contractility       Labs Review Labs Reviewed  CBC WITH  DIFFERENTIAL/PLATELET - Abnormal; Notable for the following:    Hemoglobin 12.7 (*)    MCV 74.9 (*)    MCH 22.0 (*)    MCHC 29.3 (*)    RDW 18.2 (*)    Monocytes Relative 14 (*)    Monocytes Absolute 1.1 (*)    All other components within normal limits  COMPREHENSIVE METABOLIC PANEL - Abnormal; Notable for the following:    Glucose, Bld 124 (*)    BUN 24 (*)    GFR calc non Af Amer 63 (*)    GFR calc Af Amer 73 (*)    All other components within normal limits  BLOOD GAS, ARTERIAL - Abnormal; Notable for the following:    pO2, Arterial 69.1 (*)    All other components within normal limits  CBG MONITORING, ED - Abnormal; Notable for the following:    Glucose-Capillary 127 (*)    All other components within normal limits  MRSA PCR SCREENING  CULTURE, EXPECTORATED SPUTUM-ASSESSMENT  BRAIN NATRIURETIC PEPTIDE  TROPONIN I  URINALYSIS, ROUTINE W REFLEX MICROSCOPIC  STREP PNEUMONIAE URINARY ANTIGEN  LEGIONELLA ANTIGEN, URINE  I-STAT CG4 LACTIC ACID, ED    Imaging Review Dg Chest Port 1 View  08/06/2014   CLINICAL DATA:  Acute dyspnea.  EXAM: PORTABLE CHEST - 1 VIEW  COMPARISON:  May 25, 2014.  FINDINGS: Stable cardiomediastinal silhouette. No pneumothorax or plural effusion is noted. Stable emphysematous change and scarring is noted in right upper lobe. Stable reticular and interstitial densities are noted in both lower lobes most consistent with scarring. Bony thorax is intact.  IMPRESSION: Stable probable bibasilar scarring. Stable emphysematous change in right upper lobe. No acute cardiopulmonary abnormality seen.   Electronically Signed   By: Lupita Raider, M.D.   On: 08/06/2014 18:40     EKG Interpretation   Date/Time:  Tuesday August 06 2014 17:56:39 EST Ventricular Rate:  63 PR Interval:  229 QRS Duration: 84 QT Interval:  426 QTC Calculation: 436 R Axis:   -73 Text Interpretation:  Sinus rhythm Prolonged PR interval Left anterior  fascicular block Anterior  infarct, old Confirmed by Sharika Mosquera  MD, Claudia Greenley  ([id number]) on 08/06/2014 6:06:20 PM      MDM   Final diagnoses:  Acute dyspnea  Hypoxia  Acute exacerbation of chronic obstructive pulmonary disease (COPD)  Acute on chronic respiratory failure with hypoxemia  Pulmonary HTN   Patient with worsening hypoxia short of breath. Concern for COPD exacerbation/pulmonary hypertension related versus pneumonia versus cardiac related. EKG is abnormal however similar to previous EKG. Chest x-ray reviewed no acute infiltrate. With productive cough and significant lung history, plan for Rocephin/azithromycin and admission to the hospital. ABG pending.    The patients results and plan were reviewed and discussed.   Any x-rays performed were personally reviewed by myself.   Differential diagnosis were considered with the presenting HPI.  Medications  dutasteride (AVODART)  capsule 0.5 mg (not administered)  tamsulosin (FLOMAX) capsule 0.4 mg (0.4 mg Oral Given 08/06/14 2304)  aspirin chewable tablet 81 mg (not administered)  pantoprazole (PROTONIX) EC tablet 40 mg (0 mg Oral Duplicate 08/06/14 2100)  pravastatin (PRAVACHOL) tablet 40 mg (40 mg Oral Given 08/06/14 2304)  albuterol (PROVENTIL) (2.5 MG/3ML) 0.083% nebulizer solution 2.5 mg (not administered)  feeding supplement (ENSURE COMPLETE) (ENSURE COMPLETE) liquid 237 mL (not administered)  traMADol-acetaminophen (ULTRACET) 37.5-325 MG per tablet 1 tablet (not administered)  finasteride (PROSCAR) tablet 5 mg (not administered)  tiotropium (SPIRIVA) inhalation capsule 18 mcg (not administered)  heparin injection 5,000 Units (5,000 Units Subcutaneous Given 08/06/14 2304)  sodium chloride 0.9 % injection 3 mL (3 mLs Intravenous Not Given 08/06/14 2200)  acetaminophen (TYLENOL) tablet 650 mg (not administered)    Or  acetaminophen (TYLENOL) suppository 650 mg (not administered)  ondansetron (ZOFRAN) tablet 4 mg (not administered)    Or  ondansetron (ZOFRAN)  injection 4 mg (not administered)  guaiFENesin (MUCINEX) 12 hr tablet 600 mg (600 mg Oral Given 08/06/14 2303)  methylPREDNISolone sodium succinate (SOLU-MEDROL) 125 mg/2 mL injection 60 mg (60 mg Intravenous Given 08/06/14 2304)  albuterol (PROVENTIL,VENTOLIN) solution continuous neb (10 mg/hr Nebulization Given 08/06/14 1822)  methylPREDNISolone sodium succinate (SOLU-MEDROL) 125 mg/2 mL injection 125 mg (125 mg Intravenous Given 08/06/14 1819)  cefTRIAXone (ROCEPHIN) 1 g in dextrose 5 % 50 mL IVPB (0 g Intravenous Stopped 08/06/14 2048)    Filed Vitals:   08/06/14 2130 08/06/14 2131 08/06/14 2132 08/06/14 2200  BP: 134/55   152/55  Pulse: 81 75 82 91  Temp:      TempSrc:      Resp: 25 23 27 27   SpO2: 86% 88% 87% 86%    Final diagnoses:  Acute dyspnea  Hypoxia  Acute exacerbation of chronic obstructive pulmonary disease (COPD)  Acute on chronic respiratory failure with hypoxemia  Pulmonary HTN    Admission/ observation were discussed with the admitting physician, patient and/or family and they are comfortable with the plan.      Enid Skeens, MD 08/07/14 [phone]

## 2014-08-06 NOTE — ED Notes (Signed)
Respiratory called

## 2014-08-06 NOTE — ED Notes (Signed)
Attempted calling report to 2W. Will call back promptly.

## 2014-08-07 ENCOUNTER — Telehealth: Payer: Self-pay | Admitting: Critical Care Medicine

## 2014-08-07 DIAGNOSIS — N400 Enlarged prostate without lower urinary tract symptoms: Secondary | ICD-10-CM

## 2014-08-07 DIAGNOSIS — K219 Gastro-esophageal reflux disease without esophagitis: Secondary | ICD-10-CM

## 2014-08-07 DIAGNOSIS — J84112 Idiopathic pulmonary fibrosis: Secondary | ICD-10-CM

## 2014-08-07 DIAGNOSIS — J849 Interstitial pulmonary disease, unspecified: Secondary | ICD-10-CM

## 2014-08-07 DIAGNOSIS — J439 Emphysema, unspecified: Secondary | ICD-10-CM

## 2014-08-07 DIAGNOSIS — Z66 Do not resuscitate: Secondary | ICD-10-CM

## 2014-08-07 LAB — URINALYSIS, ROUTINE W REFLEX MICROSCOPIC
BILIRUBIN URINE: NEGATIVE
Glucose, UA: NEGATIVE mg/dL
Ketones, ur: NEGATIVE mg/dL
Leukocytes, UA: NEGATIVE
NITRITE: NEGATIVE
Protein, ur: NEGATIVE mg/dL
Specific Gravity, Urine: 1.01 (ref 1.005–1.030)
UROBILINOGEN UA: 0.2 mg/dL (ref 0.0–1.0)
pH: 6 (ref 5.0–8.0)

## 2014-08-07 LAB — CBC WITH DIFFERENTIAL/PLATELET
BASOS ABS: 0 10*3/uL (ref 0.0–0.1)
Basophils Relative: 0 % (ref 0–1)
EOS PCT: 0 % (ref 0–5)
Eosinophils Absolute: 0 10*3/uL (ref 0.0–0.7)
HEMATOCRIT: 39.5 % (ref 39.0–52.0)
HEMOGLOBIN: 12.1 g/dL — AB (ref 13.0–17.0)
LYMPHS ABS: 0.6 10*3/uL — AB (ref 0.7–4.0)
Lymphocytes Relative: 12 % (ref 12–46)
MCH: 22.9 pg — AB (ref 26.0–34.0)
MCHC: 30.6 g/dL (ref 30.0–36.0)
MCV: 74.7 fL — ABNORMAL LOW (ref 78.0–100.0)
MONO ABS: 0.1 10*3/uL (ref 0.1–1.0)
Monocytes Relative: 3 % (ref 3–12)
Neutro Abs: 4 10*3/uL (ref 1.7–7.7)
Neutrophils Relative %: 85 % — ABNORMAL HIGH (ref 43–77)
Platelets: 243 10*3/uL (ref 150–400)
RBC: 5.29 MIL/uL (ref 4.22–5.81)
RDW: 18 % — ABNORMAL HIGH (ref 11.5–15.5)
WBC: 4.7 10*3/uL (ref 4.0–10.5)

## 2014-08-07 LAB — COMPREHENSIVE METABOLIC PANEL
ALK PHOS: 52 U/L (ref 39–117)
ALT: 14 U/L (ref 0–53)
ANION GAP: 8 (ref 5–15)
AST: 18 U/L (ref 0–37)
Albumin: 3.3 g/dL — ABNORMAL LOW (ref 3.5–5.2)
BILIRUBIN TOTAL: 0.4 mg/dL (ref 0.3–1.2)
BUN: 20 mg/dL (ref 6–23)
CHLORIDE: 111 mmol/L (ref 96–112)
CO2: 22 mmol/L (ref 19–32)
Calcium: 8.4 mg/dL (ref 8.4–10.5)
Creatinine, Ser: 0.88 mg/dL (ref 0.50–1.35)
GFR calc Af Amer: 86 mL/min — ABNORMAL LOW (ref >90)
GFR calc non Af Amer: 74 mL/min — ABNORMAL LOW (ref >90)
Glucose, Bld: 169 mg/dL — ABNORMAL HIGH (ref 70–99)
Potassium: 4.1 mmol/L (ref 3.5–5.1)
Sodium: 141 mmol/L (ref 135–145)
Total Protein: 6.3 g/dL (ref 6.0–8.3)

## 2014-08-07 LAB — URINE MICROSCOPIC-ADD ON

## 2014-08-07 LAB — STREP PNEUMONIAE URINARY ANTIGEN: Strep Pneumo Urinary Antigen: NEGATIVE

## 2014-08-07 LAB — SEDIMENTATION RATE: Sed Rate: 3 mm/hr (ref 0–16)

## 2014-08-07 LAB — MRSA PCR SCREENING: MRSA by PCR: NEGATIVE

## 2014-08-07 LAB — PROTIME-INR
INR: 1.18 (ref 0.00–1.49)
Prothrombin Time: 15.1 seconds (ref 11.6–15.2)

## 2014-08-07 LAB — LACTIC ACID, PLASMA: Lactic Acid, Venous: 2.7 mmol/L (ref 0.5–2.0)

## 2014-08-07 MED ORDER — POTASSIUM CHLORIDE CRYS ER 20 MEQ PO TBCR
20.0000 meq | EXTENDED_RELEASE_TABLET | Freq: Once | ORAL | Status: AC
  Administered 2014-08-07: 20 meq via ORAL
  Filled 2014-08-07: qty 1

## 2014-08-07 MED ORDER — SALINE SPRAY 0.65 % NA SOLN
2.0000 | Freq: Two times a day (BID) | NASAL | Status: DC
  Administered 2014-08-07 – 2014-08-09 (×4): 2 via NASAL
  Filled 2014-08-07: qty 44

## 2014-08-07 MED ORDER — FUROSEMIDE 10 MG/ML IJ SOLN
40.0000 mg | Freq: Once | INTRAMUSCULAR | Status: AC
  Administered 2014-08-07: 40 mg via INTRAVENOUS
  Filled 2014-08-07: qty 4

## 2014-08-07 MED ORDER — MORPHINE SULFATE 10 MG/5ML PO SOLN
4.0000 mg | Freq: Once | ORAL | Status: AC
  Administered 2014-08-07: 4 mg via ORAL
  Filled 2014-08-07: qty 5

## 2014-08-07 MED ORDER — FLUTICASONE PROPIONATE 50 MCG/ACT NA SUSP
2.0000 | Freq: Every day | NASAL | Status: DC
  Administered 2014-08-08 – 2014-08-09 (×2): 2 via NASAL
  Filled 2014-08-07: qty 16

## 2014-08-07 MED ORDER — ENSURE COMPLETE PO LIQD
237.0000 mL | Freq: Three times a day (TID) | ORAL | Status: DC
  Administered 2014-08-07 – 2014-08-09 (×6): 237 mL via ORAL

## 2014-08-07 NOTE — Progress Notes (Signed)
Critical lab result Lactic 2.7. Dr. Janee Mornhompson on unit and was verbally notified. Will continue to monitor patient.

## 2014-08-07 NOTE — Telephone Encounter (Signed)
Spoke with pt's daughter, wants to let us know [facility] and is requesting one of our providers to see patient.  I advised that Canary BrimBrandi Contreras had seen patient earlier this morning according to his chart, who is with our practice.  Nothing further needed.

## 2014-08-07 NOTE — Consult Note (Signed)
Name: Jim Contreras MRN: [id number] DOB: [birth year]    ADMISSION DATE:  08/06/2014 CONSULTATION DATE:  08/07/14  REFERRING MD :  Dr. Janee Morn   CHIEF COMPLAINT:  Hypoxia / Chronic Respiratory Failure   BRIEF PATIENT DESCRIPTION: 81 y/o M, ALF Resident, with PMH of ILD & 4-5 L O2 dependent COPD admitted on 2/23 with decreased O2 saturations. DNR, followed by Dr. Delford Field.   SIGNIFICANT EVENTS  2/23  Admit with hypoxia (84% on 4L O2).  Desats at ALF with PT  STUDIES:  2/23  CXR >> no acute abnormality, stable basilar scarring noted   HISTORY OF PRESENT ILLNESS:  81 y/o M, ALF Resident, former smoker (40+ years 1.5 ppd) with PMH of Aortic Stenosis, HTN, HLD, BPH, CVA, PAH, ILD/NSIP with lower lobe reticular/nodular distribution (atypical for UIP), 4-5 L O2 dependent COPD and recent admits for LLL PNA who presented to [facility] ER on 2/23 from ALF with reports of stuffy nose x 3 wks and desaturations with activity.  Daughter reports the patient is wheelchair bound at baseline due to dyspnea / deconditioning.  He is able to transfer from chair to bed with assistance (walker + staff).  At the ALF, he was recently re-enrolled in a PT program.  During PT, he was noted to desaturate into the 70's with activity.  The patient previously had an oxymizer but it apparently has been misplaced at ALF.    Exam notes reflect patient was oriented on arrival but found to have low O2 saturations on 4L (84%).  He reported a 3 week hx of stuffy nose.  He denied other associated symptoms - worsening of SOB, DOE, chest pain, chest pain with inspiration, n/v, abdominal pain.  He has been evaluated by SLP in the recent past and was recommended to slow down with eating but not specific dietary changes required.  During last hospitalization, he was evaluated for hospice care and family turned down at the time.    Patient was admitted per [facility], treated with IV steroids, nebulized bronchodilators, empiric antibiotics and increased  O2.   ABG assessed 2/23 showed ph 7.399 / 38 / 69 / 23. CXR without acute infiltrate.  PCCM consulted for evaluation of hypoxia on 2/24.    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     PAST MEDICAL HISTORY :   has a past medical history of Hypertension; Hypercholesteremia; BPH (benign prostatic hyperplasia); ILD (interstitial lung disease); COPD, moderate; and Stroke.  has past surgical history that includes Cataract extraction (Bilateral); Appendectomy; Hernia repair; and Flexible sigmoidoscopy (N/A, 04/24/2013).    HOME MEDICATIONS:  Prior to Admission medications   Medication Sig Start Date End Date Taking? Authorizing Provider  albuterol (PROVENTIL HFA;VENTOLIN HFA) 108 (90 BASE) MCG/ACT inhaler Inhale 1 puff into the lungs every 6 (six) hours as needed for wheezing or shortness of breath.   Yes Historical Provider, MD  albuterol (PROVENTIL) (2.5 MG/3ML) 0.083% nebulizer solution Take 3 mLs (2.5 mg total) by nebulization every 4 (four) hours as needed for wheezing or shortness of breath. 04/02/14  Yes Richarda Overlie, MD  aspirin 81 MG tablet Take 81 mg by mouth daily.   Yes Historical Provider, MD  dutasteride (AVODART) 0.5 MG capsule Take 0.5 mg by mouth daily.   Yes Historical Provider, MD  feeding supplement, ENSURE COMPLETE, (ENSURE COMPLETE) LIQD Take 237 mLs by mouth 2 (two) times daily between meals. 04/02/14  Yes  Richarda OverlieNayana Abrol, MD  finasteride (PROSCAR) 5 MG tablet Take 5 mg by mouth daily.   Yes Historical Provider, MD  lansoprazole (PREVACID) 30 MG capsule Take 30 mg by mouth daily at 12 noon.   Yes Historical Provider, MD  pravastatin (PRAVACHOL) 40 MG tablet Take 40 mg by mouth daily.   Yes Historical Provider, MD  Tamsulosin HCl (FLOMAX) 0.4 MG CAPS Take 0.4 mg by mouth daily.   Yes Historical Provider, MD  tiotropium (SPIRIVA) 18 MCG inhalation capsule Place 18 mcg into inhaler and  inhale daily.   Yes Historical Provider, MD  traMADol-acetaminophen (ULTRACET) 37.5-325 MG per tablet Take 1 tablet by mouth every 8 (eight) hours as needed for moderate pain or severe pain. 04/03/14  Yes Leroy SeaPrashant K Singh, MD  benzonatate (TESSALON) 100 MG capsule Take 1 capsule (100 mg total) by mouth every 8 (eight) hours. Patient not taking: Reported on 08/06/2014 05/25/14   Rolland PorterMark Quavis, MD  doxycycline (VIBRAMYCIN) 100 MG capsule Take 1 capsule (100 mg total) by mouth 2 (two) times daily. Patient not taking: Reported on 08/06/2014 05/25/14   Rolland PorterMark Talon, MD  guaiFENesin (MUCINEX) 600 MG 12 hr tablet Take 1 tablet (600 mg total) by mouth 2 (two) times daily. Patient not taking: Reported on 08/06/2014 04/02/14   Richarda OverlieNayana Abrol, MD  LORazepam (ATIVAN) 2 MG/ML concentrated solution Take 0.5 mLs (1 mg total) by mouth every 6 (six) hours as needed for anxiety. Patient not taking: Reported on 08/06/2014 04/03/14   Leroy SeaPrashant K Singh, MD  Morphine Sulfate (MORPHINE CONCENTRATE) 10 mg / 0.5 ml concentrated solution Take 0.25 mLs (5 mg total) by mouth every 4 (four) hours as needed for severe pain or shortness of breath. Patient not taking: Reported on 08/06/2014 04/03/14   Leroy SeaPrashant K Singh, MD  predniSONE (DELTASONE) 20 MG tablet Take 1 tablet (20 mg total) by mouth daily with breakfast. 1 p bid x 5 days Patient not taking: Reported on 08/06/2014 05/25/14   Rolland PorterMark Dermot, MD  traZODone (DESYREL) 50 MG tablet Take 1 tablet (50 mg total) by mouth at bedtime as needed for sleep. Patient not taking: Reported on 08/06/2014 04/02/14   Richarda OverlieNayana Abrol, MD   Allergies  Allergen Reactions  . Floxin [Ofloxacin] Other (See Comments)    Lost feeling in feet  . Penicillins Other (See Comments)    Ulcers in mouth  . Sulfa Antibiotics Other (See Comments)    Ulcers in mouth    FAMILY HISTORY:  family history is not on file.   SOCIAL HISTORY:  reports that he quit smoking about 43 years ago. His smoking use included  Cigarettes. He has a 40 pack-year smoking history. He has never used smokeless tobacco. He reports that he drinks alcohol. He reports that he does not use illicit drugs.  REVIEW OF SYSTEMS:   Constitutional: Negative for fever, chills, weight loss, malaise/fatigue and diaphoresis.  HENT: Negative for hearing loss, ear pain, nosebleeds, congestion, sore throat, neck pain, tinnitus and ear discharge.  Reports 'stuffy' nose  Eyes: Negative for blurred vision, double vision, photophobia, pain, discharge and redness.  Respiratory: Negative for cough, hemoptysis, sputum production, shortness of breath, wheezing and stridor.  Reports no change in baseline SOB/DOE.  Cardiovascular: Negative for chest pain, palpitations, orthopnea, claudication, leg swelling and PND.  Gastrointestinal: Negative for heartburn, nausea, vomiting, abdominal pain, diarrhea, constipation, blood in stool and melena.  Genitourinary: Negative for dysuria, urgency, frequency, hematuria and flank pain.  Musculoskeletal: Negative for myalgias, back pain, joint pain and  falls.  Skin: Negative for itching and rash.  Neurological: Negative for dizziness, tingling, tremors, sensory change, speech change, focal weakness, seizures, loss of consciousness, weakness and headaches.  Endo/Heme/Allergies: Negative for environmental allergies and polydipsia. Does not bruise/bleed easily.  SUBJECTIVE:   VITAL SIGNS: Temp:  [97.5 F (36.4 C)-97.8 F (36.6 C)] 97.8 F (36.6 C) (02/24 0800) Pulse Rate:  [58-91] 91 (02/23 2200) Resp:  [16-27] 27 (02/23 2200) BP: (121-166)/(51-61) 166/57 mmHg (02/24 0500) SpO2:  [86 %-97 %] 86 % (02/23 2200)  PHYSICAL EXAMINATION: General:  Frail elderly male in NAD Neuro:  AAOx4, speech clear, MAE  HEENT:  MM pink/moist, no jvd  Cardiovascular:  s1s2 rrr, SEM 2/6 heard best 2nd ICS RSB Lungs:  resp's even/non-labored, lungs bilaterally diminished but essentially clear, moist cough but not productive    Abdomen:  NTND, BSx4 active  Musculoskeletal:  No acute deformities  Skin:  Warm/dry, no edema    Recent Labs Lab 08/06/14 1742  NA 141  K 4.1  CL 108  CO2 25  BUN 24*  CREATININE 1.03  GLUCOSE 124*    Recent Labs Lab 08/06/14 1742 08/07/14 0755  HGB 12.7* 12.1*  HCT 43.3 39.5  WBC 8.2 4.7  PLT 264 243   Dg Chest Port 1 View  08/06/2014   CLINICAL DATA:  Acute dyspnea.  EXAM: PORTABLE CHEST - 1 VIEW  COMPARISON:  May 25, 2014.  FINDINGS: Stable cardiomediastinal silhouette. No pneumothorax or plural effusion is noted. Stable emphysematous change and scarring is noted in right upper lobe. Stable reticular and interstitial densities are noted in both lower lobes most consistent with scarring. Bony thorax is intact.  IMPRESSION: Stable probable bibasilar scarring. Stable emphysematous change in right upper lobe. No acute cardiopulmonary abnormality seen.   Electronically Signed   By: Lupita Raider, M.D.   On: 08/06/2014 18:40    ASSESSMENT / PLAN:  IPF / NSIP  COPD - PFT's in 10/2012 showed moderated obstruction / restriction, significant reduction in DLCO  PAH  Hypoxia  Mild AS   Plan: O2 to support saturations 88-95%  Obtain oxymizer for O2 delivery, inpatient titration of O2 Solumedrol 60 mg Q 24 with  PPI  Nasal hygiene:  Nasal saline spray, flonase  Pulmonary hygiene Lasix 40 mg IV x1 + KCL Monitor for signs of acute infection - rising WBC, fever, changes on CXR DNR / DNI  PT consult - titrate O2 oxymizer  Follow up with Pulmonary as outpatient upon discharge.  Follow prior diet / aspiration precaution recommendations    Canary Brim, NP-C [facility] Pgr: [phone] or [phone]    08/07/2014, 9:11 AM

## 2014-08-07 NOTE — H&P (Signed)
[facility] History and Physical  Patient: Jim Contreras  MRN: [id number]  DOB: 10-Apr-1924  DOS: the patient was seen and examined on 08/06/2014 PCP: [name], MD  Chief Complaint: Shortness of breath and nasal stuffiness  HPI: Jim Contreras is a 81 y.o. male with Past medical history of hypertension, interstitial lung disease, COPD, CVA, BPH, chronic respiratory failure on 4-5 L of oxygen. The patient is presenting with progressively worsening headache and stuffy nose. Patient was brought from nursing home with the complaint of change in mental status with the patient is very well aware and oriented of his surroundings and was able to tell me complete history. He has noted for last 3 weeks stuffy nose but does not have any cough is not have any more shortness of breath does not have any pain in his chest or abdominal pain denies any nausea or vomiting denies any acid reflux denies any burning urination. Complains of occasional loose stool but normal regular diarrhea or blood in his bowels. Denies any recent changes in his medications. Denies any swelling of his legs or any focal deficit. As per last documentation the patient is supposed to be on 4 L of oxygen at rest and 5 L of oxygen at exertion. Patient was seen saturating 84% on 4 L of oxygen. During his last admission the patient was seen by palliative care for possible hospice due to his end-stage ILD, but due to the improvement in his symptoms patient and the family decided to hold off on hospice.  The patient is coming from home. And at his baseline independent for most of his ADL.  Review of Systems: as mentioned in the history of present illness.  A Comprehensive review of the other systems is negative.  Past Medical History  Diagnosis Date  . Hypertension   . Hypercholesteremia   . BPH (benign prostatic hyperplasia)   . ILD (interstitial lung disease)   . COPD, moderate   . Stroke     Thalamic   Past  Surgical History  Procedure Laterality Date  . Cataract extraction Bilateral   . Appendectomy    . Hernia repair    . Flexible sigmoidoscopy N/A 04/24/2013    Procedure: FLEXIBLE SIGMOIDOSCOPY;  Surgeon: Shirley Friar, MD;  Location: [facility] ENDOSCOPY;  Service: Endoscopy;  Laterality: N/A;   Social History:  reports that he quit smoking about 43 years ago. His smoking use included Cigarettes. He has a 40 pack-year smoking history. He has never used smokeless tobacco. He reports that he drinks alcohol. He reports that he does not use illicit drugs.  Allergies  Allergen Reactions  . Floxin [Ofloxacin] Other (See Comments)    Lost feeling in feet  . Penicillins Other (See Comments)    Ulcers in mouth  . Sulfa Antibiotics Other (See Comments)    Ulcers in mouth    No family history on file.  Prior to Admission medications   Medication Sig Start Date End Date Taking? Authorizing Provider  albuterol (PROVENTIL HFA;VENTOLIN HFA) 108 (90 BASE) MCG/ACT inhaler Inhale 1 puff into the lungs every 6 (six) hours as needed for wheezing or shortness of breath.   Yes Historical Provider, MD  albuterol (PROVENTIL) (2.5 MG/3ML) 0.083% nebulizer solution Take 3 mLs (2.5 mg total) by nebulization every 4 (four) hours as needed for wheezing or shortness of breath. 04/02/14  Yes Richarda Overlie, MD  aspirin 81 MG tablet Take 81 mg by mouth daily.   Yes Historical Provider, MD  dutasteride (AVODART) 0.5 MG capsule Take 0.5 mg by mouth daily.   Yes Historical Provider, MD  feeding supplement, ENSURE COMPLETE, (ENSURE COMPLETE) LIQD Take 237 mLs by mouth 2 (two) times daily between meals. 04/02/14  Yes Richarda OverlieNayana Abrol, MD  finasteride (PROSCAR) 5 MG tablet Take 5 mg by mouth daily.   Yes Historical Provider, MD  lansoprazole (PREVACID) 30 MG capsule Take 30 mg by mouth daily at 12 noon.   Yes Historical Provider, MD  pravastatin (PRAVACHOL) 40 MG tablet Take 40 mg by mouth daily.   Yes Historical Provider, MD   Tamsulosin HCl (FLOMAX) 0.4 MG CAPS Take 0.4 mg by mouth daily.   Yes Historical Provider, MD  tiotropium (SPIRIVA) 18 MCG inhalation capsule Place 18 mcg into inhaler and inhale daily.   Yes Historical Provider, MD  traMADol-acetaminophen (ULTRACET) 37.5-325 MG per tablet Take 1 tablet by mouth every 8 (eight) hours as needed for moderate pain or severe pain. 04/03/14  Yes Leroy SeaPrashant K Singh, MD  benzonatate (TESSALON) 100 MG capsule Take 1 capsule (100 mg total) by mouth every 8 (eight) hours. Patient not taking: Reported on 08/06/2014 05/25/14   Rolland PorterMark Lyn, MD  doxycycline (VIBRAMYCIN) 100 MG capsule Take 1 capsule (100 mg total) by mouth 2 (two) times daily. Patient not taking: Reported on 08/06/2014 05/25/14   Rolland PorterMark Alijah, MD  guaiFENesin (MUCINEX) 600 MG 12 hr tablet Take 1 tablet (600 mg total) by mouth 2 (two) times daily. Patient not taking: Reported on 08/06/2014 04/02/14   Richarda OverlieNayana Abrol, MD  LORazepam (ATIVAN) 2 MG/ML concentrated solution Take 0.5 mLs (1 mg total) by mouth every 6 (six) hours as needed for anxiety. Patient not taking: Reported on 08/06/2014 04/03/14   Leroy SeaPrashant K Singh, MD  Morphine Sulfate (MORPHINE CONCENTRATE) 10 mg / 0.5 ml concentrated solution Take 0.25 mLs (5 mg total) by mouth every 4 (four) hours as needed for severe pain or shortness of breath. Patient not taking: Reported on 08/06/2014 04/03/14   Leroy SeaPrashant K Singh, MD  predniSONE (DELTASONE) 20 MG tablet Take 1 tablet (20 mg total) by mouth daily with breakfast. 1 p bid x 5 days Patient not taking: Reported on 08/06/2014 05/25/14   Rolland PorterMark Tayt, MD  traZODone (DESYREL) 50 MG tablet Take 1 tablet (50 mg total) by mouth at bedtime as needed for sleep. Patient not taking: Reported on 08/06/2014 04/02/14   Richarda OverlieNayana Abrol, MD    Physical Exam: Filed Vitals:   08/06/14 2131 08/06/14 2132 08/06/14 2200 08/07/14 0500  BP:   152/55 166/57  Pulse: 75 82 91   Temp:      TempSrc:      Resp: 23 27 27    SpO2: 88% 87% 86%      General: Alert, Awake and Oriented to Time, Place and Person. Appear in mild distress Eyes: PERRL ENT: Oral Mucosa clear moist. Neck: no JVD Cardiovascular: S1 and S2 Present, aortic systolic Murmur, Peripheral Pulses Present Respiratory: Bilateral Air entry equal and Decreased, bilateral fine basal crackles no wheezes Abdomen: Bowel Sound present, Soft and non tender Skin: no Rash Extremities: no Pedal edema, no calf tenderness Neurologic: Grossly no focal neuro deficit.  Labs on Admission:  CBC:  Recent Labs Lab 08/06/14 1742  WBC 8.2  NEUTROABS 5.4  HGB 12.7*  HCT 43.3  MCV 74.9*  PLT 264    CMP     Component Value Date/Time   NA 141 08/06/2014 1742   K 4.1 08/06/2014 1742   CL 108 08/06/2014 1742  CO2 25 08/06/2014 1742   GLUCOSE 124* 08/06/2014 1742   BUN 24* 08/06/2014 1742   CREATININE 1.03 08/06/2014 1742   CALCIUM 9.0 08/06/2014 1742   PROT 6.9 08/06/2014 1742   ALBUMIN 3.6 08/06/2014 1742   AST 25 08/06/2014 1742   ALT 16 08/06/2014 1742   ALKPHOS 61 08/06/2014 1742   BILITOT 0.3 08/06/2014 1742   GFRNONAA 63* 08/06/2014 1742   GFRAA 73* 08/06/2014 1742    No results for input(s): LIPASE, AMYLASE in the last 168 hours.   Recent Labs Lab 08/06/14 1745  TROPONINI 0.03   BNP (last 3 results)  Recent Labs  08/06/14 1745  BNP 77.7    ProBNP (last 3 results)  Recent Labs  03/24/14 2158  PROBNP 1012.0*     Radiological Exams on Admission: Dg Chest Port 1 View  08/06/2014   CLINICAL DATA:  Acute dyspnea.  EXAM: PORTABLE CHEST - 1 VIEW  COMPARISON:  May 25, 2014.  FINDINGS: Stable cardiomediastinal silhouette. No pneumothorax or plural effusion is noted. Stable emphysematous change and scarring is noted in right upper lobe. Stable reticular and interstitial densities are noted in both lower lobes most consistent with scarring. Bony thorax is intact.  IMPRESSION: Stable probable bibasilar scarring. Stable emphysematous change in  right upper lobe. No acute cardiopulmonary abnormality seen.   Electronically Signed   By: Lupita Raider, M.D.   On: 08/06/2014 18:40    EKG: Independently reviewed. normal sinus rhythm, nonspecific ST and T waves changes.  Assessment/Plan Principal Problem:   Acute on chronic respiratory failure with hypoxia Active Problems:   ILD (interstitial lung disease)   Pulmonary hypertension   Aortic stenosis   1. Acute on chronic respiratory failure with hypoxia  The patient is presenting with acute on chronic respiratory failure with hypoxia. Patient isn't baseline 4 L to 5 L of oxygen currently requiring minimum 6 L of oxygen to maintain adequate saturation. Patient does not have any significant wheezing does not have any significant crackles. Chest x-ray does not show any evidence of new pneumonia. Does not have any fevers does not have any cough does not have any leukocytosis. With this it is more likely that this is progression of patient's chronic ILD with a possible flareup. The patient will be admitted in stepdown unit. He will be started on pulmonary toiletry with flutter device and Mucinex. Patient will also be started on Solu-Medrol every 12 hours. Duo nebs every 6 hours. Currently holding off on antibiotics as there is no evidence of infection. Checking streptococcal pneumoniae and legionella 90s and as well as sputum culture. If there is no improvement in patient's oxygenation with visible treatment pulmonary consultation may be required.  2. BPH. Continuing home medications.  3. GERD. Continuing PPI.  4. Dyslipidemia. Continuing pravastatin.  Advance goals of care discussion: DNR/DNI, almost fall mentions about using antibiotics as and when needed, admitting to the hospital as and when needed, IV fluids if necessary, no feeding tube.  DVT Prophylaxis: subcutaneous Heparin Nutrition: Regular diet  Disposition: Admitted to inpatient in step-down  unit.  Author: [name], MD [facility] Pager: [phone]  If 7PM-7AM, please contact night-coverage [email or web address] Password [email or web address]

## 2014-08-07 NOTE — Progress Notes (Signed)
[facility] PROGRESS NOTE  Jim Contreras GUR:[id number] DOB: Feb 21, 1924 DOA: 08/06/2014 PCP: [name], MD  Assessment/Plan: #1 acute on chronic respiratory failure/chronic interstitial lung disease Likely secondary to progressive worsening interstitial lung disease versus flare of interstitial lung disease. Patient currently on about 6 L of nasal cannula was on facemask early on. Patient speaking in full sentences. Patient denies any shortness of breath or chest pain. Continue current empiric IV steroids, oxygen, scheduled nebulizers, pulmonary toilet, Mucinex. Will consult with pulmonary and critical care medicine for further evaluation and management.  #2 gastroesophageal reflux disease PPI.  #3 dyslipidemia Continue statin.  #4 BPH Continue Proscar and Avodart and Flomax.  #5 hypertension Stable. Continue Proscar and Flomax.  #6 prophylaxis PPI for GI prophylaxis. Heparin for DVT prophylaxis.  Code Status: DO NOT RESUSCITATE Family Communication: Updated patient and family at bedside. Disposition Plan: Remain in the step down unit.   Consultants:  None  Procedures:  Chest x-ray 08/06/2014  Antibiotics:  None  HPI/Subjective: Patient denies any SOB, no chest pain.  Objective: Filed Vitals:   08/07/14 0800  BP:   Pulse:   Temp: 97.8 F (36.6 C)  Resp:    No intake or output data in the 24 hours ending 08/07/14 0858 There were no vitals filed for this visit.  Exam:   General:  NAD  Cardiovascular: RRR  Respiratory: CTAB.Decreased BS in bases.  Abdomen: Soft/NT/ND/+BS  Musculoskeletal: No c/c/e  Data Reviewed: Basic Metabolic Panel:  Recent Labs Lab 08/06/14 1742  NA 141  K 4.1  CL 108  CO2 25  GLUCOSE 124*  BUN 24*  CREATININE 1.03  CALCIUM 9.0   Liver Function Tests:  Recent Labs Lab 08/06/14 1742  AST 25  ALT 16  ALKPHOS 61  BILITOT 0.3  PROT 6.9  ALBUMIN 3.6   No results for input(s): LIPASE, AMYLASE in  the last 168 hours. No results for input(s): AMMONIA in the last 168 hours. CBC:  Recent Labs Lab 08/06/14 1742 08/07/14 0755  WBC 8.2 4.7  NEUTROABS 5.4 PENDING  HGB 12.7* 12.1*  HCT 43.3 39.5  MCV 74.9* 74.7*  PLT 264 243   Cardiac Enzymes:  Recent Labs Lab 08/06/14 1745  TROPONINI 0.03   BNP (last 3 results)  Recent Labs  08/06/14 1745  BNP 77.7    ProBNP (last 3 results)  Recent Labs  03/24/14 2158  PROBNP 1012.0*    CBG:  Recent Labs Lab 08/06/14 1722  GLUCAP 127*    Recent Results (from the past 240 hour(s))  MRSA PCR Screening     Status: None   Collection Time: 08/06/14 10:42 PM  Result Value Ref Range Status   MRSA by PCR NEGATIVE NEGATIVE Final    Comment:        The GeneXpert MRSA Assay (FDA approved for NASAL specimens only), is one component of a comprehensive MRSA colonization surveillance program. It is not intended to diagnose MRSA infection nor to guide or monitor treatment for MRSA infections.      Studies: Dg Chest Port 1 View  08/06/2014   CLINICAL DATA:  Acute dyspnea.  EXAM: PORTABLE CHEST - 1 VIEW  COMPARISON:  May 25, 2014.  FINDINGS: Stable cardiomediastinal silhouette. No pneumothorax or plural effusion is noted. Stable emphysematous change and scarring is noted in right upper lobe. Stable reticular and interstitial densities are noted in both lower lobes most consistent with scarring. Bony thorax is intact.  IMPRESSION: Stable probable bibasilar scarring. Stable emphysematous change  in right upper lobe. No acute cardiopulmonary abnormality seen.   Electronically Signed   By: Lupita RaiderJames  Green Jr, M.D.   On: 08/06/2014 18:40    Scheduled Meds: . aspirin  81 mg Oral Daily  . dutasteride  0.5 mg Oral Daily  . feeding supplement (ENSURE COMPLETE)  237 mL Oral BID BM  . finasteride  5 mg Oral Daily  . guaiFENesin  600 mg Oral BID  . heparin  5,000 Units Subcutaneous 3 times per day  . methylPREDNISolone (SOLU-MEDROL)  injection  60 mg Intravenous Q24H  . pantoprazole  40 mg Oral Daily  . pravastatin  40 mg Oral Daily  . sodium chloride  3 mL Intravenous Q12H  . tamsulosin  0.4 mg Oral Daily  . tiotropium  18 mcg Inhalation Daily   Continuous Infusions:   Principal Problem:   Acute on chronic respiratory failure with hypoxia Active Problems:   ILD (interstitial lung disease)   Pulmonary hypertension   Aortic stenosis   Hypertension   DNR (do not resuscitate)    Time spent: 40 mins    Jackson Hospital And ClinicHOMPSON,Kelsie Kramp MD [facility] Pager [phone]. If 7PM-7AM, please contact night-coverage at [email or web address], password [email or web address] 08/07/2014, 8:58 AM  LOS: 1 day

## 2014-08-07 NOTE — Progress Notes (Signed)
CARE MANAGEMENT NOTE 08/07/2014  Patient:  Jim Contreras,Jim Contreras   Account Number:  [id number]  Date Initiated:  08/07/2014  Documentation initiated by:  DAVIS,RHONDA  Subjective/Objective Assessment:   pt with hx of copd and increased resp distress and un reposnive to meds over the last several days.  on 5l anjd 02 level 89%     Action/Plan:   snf   Anticipated DC Date:  08/10/2014   Anticipated DC Plan:  SKILLED NURSING FACILITY  In-house referral  Clinical Social Worker      DC Planning Services  CM consult      [facility]  NA   Choice offered to / List presented to:  NA   DME arranged  OTHER - SEE COMMENT      DME agency  [facility]     HH arranged  NA      HH agency  NA   Status of service:  In process, will continue to follow Medicare Important Message given?   (If response is "NO", the following Medicare IM given date fields will be blank) Date Medicare IM given:   Medicare IM given by:   Date Additional Medicare IM given:   Additional Medicare IM given by:    Discharge Disposition:    Per UR Regulation:  Reviewed for med. necessity/level of care/duration of stay  If discussed at Long Length of Stay Meetings, dates discussed:    Comments:  Feb. 24 2016/Rhonda L. Earlene Plateravis, RN, BSN, CCM. Case Management [facility] [phone] No discharge needs present of time of review. Oximizer requested through [facility] rep. will bring to [phone].

## 2014-08-07 NOTE — Progress Notes (Signed)
INITIAL NUTRITION ASSESSMENT  DOCUMENTATION CODES Per approved criteria  -Severe malnutrition in the context of chronic illness   Pt meets criteria for severe MALNUTRITION in the context of chronic illness as evidenced by moderate to severe depletion of fat and muscle mass  INTERVENTION: -Ensure Complete TID providing 350 kcal and 13 g protein  NUTRITION DIAGNOSIS: Inadequate oral intake related to increased needs as evidenced by estimated needs.   Goal: Pt to meet >/= 90% of estimated needs  Monitor:  Pt PO intake, weight trends and labs  Reason for Assessment: MST = 3  81 y.o. male  Admitting Dx: Acute on chronic respiratory failure with hypoxia  ASSESSMENT: Pt hx of HTN, intersitial lung disease, COPD, CVA, and chronic respiratory failure of 4-5 L of oxygen. Admitted for acute on chronic respiratory failure from SNF.  Pt reports weight loss but does not know what his normal weight is and he does not know how much weight he has lost. Per weight records, his weight has fluctuated over the past few month but no significant weight loss. He says his appetite has not changed recently, but he doesn't eat too much.  He reports eggs and bacon for breakfast, a sandwich at lunch and soup at dinner.  Reports drinking 3 Ensure a day and wishes to receive those here.   Nutrition Focused Physical Exam:  Subcutaneous Fat:  Orbital Region: moderate depletion Upper Arm Region: moderate depletion Thoracic and Lumbar Region: n/a  Muscle:  Temple Region: severe depletion Clavicle Bone Region: severe depletion Clavicle and Acromion Bone Region: severe depletion Scapular Bone Region: n/a Dorsal Hand: severe depletion Patellar Region: moderate depletion Anterior Thigh Region: well nourished Posterior Calf Region: well nourished  Edema: none present    Height: Ht Readings from Last 1 Encounters:  08/07/14 5\' 7"  (1.702 m)    Weight: Wt Readings from Last 1 Encounters:  08/07/14  149 lb 7.6 oz (67.8 kg)    Ideal Body Weight: 148 lbs  % Ideal Body Weight: 100%  Wt Readings from Last 10 Encounters:  08/07/14 149 lb 7.6 oz (67.8 kg)  05/25/14 140 lb (63.504 kg)  04/05/14 153 lb 12.8 oz (69.763 kg)  11/28/13 149 lb (67.586 kg)  04/26/13 139 lb 1.8 oz (63.1 kg)  12/31/12 145 lb 15.1 oz (66.2 kg)  11/10/12 154 lb (69.854 kg)  07/12/12 160 lb (72.576 kg)  05/05/12 161 lb (73.029 kg)  04/20/12 160 lb 6.4 oz (72.757 kg)    Usual Body Weight: n/a  % Usual Body Weight: n/a  BMI:  Body mass index is 23.41 kg/(m^2).  Estimated Nutritional Needs: Kcal: 2300-2500 kcals  Protein: 85-100 g protein Fluid: >/= 2.3 L/day   Skin: WDL  Diet Order: Diet regular  EDUCATION NEEDS: -No education needs identified at this time  No intake or output data in the 24 hours ending 08/07/14 1144  Last BM: none charted   Labs:   Recent Labs Lab 08/06/14 1742 08/07/14 0755  NA 141 141  K 4.1 4.1  CL 108 111  CO2 25 22  BUN 24* 20  CREATININE 1.03 0.88  CALCIUM 9.0 8.4  GLUCOSE 124* 169*    CBG (last 3)   Recent Labs  08/06/14 1722  GLUCAP 127*    Scheduled Meds: . aspirin  81 mg Oral Daily  . dutasteride  0.5 mg Oral Daily  . feeding supplement (ENSURE COMPLETE)  237 mL Oral BID BM  . finasteride  5 mg Oral Daily  . fluticasone  2 spray Each Nare Daily  . guaiFENesin  600 mg Oral BID  . heparin  5,000 Units Subcutaneous 3 times per day  . methylPREDNISolone (SOLU-MEDROL) injection  60 mg Intravenous Q24H  . pantoprazole  40 mg Oral Daily  . pravastatin  40 mg Oral Daily  . sodium chloride  2 spray Each Nare BID  . sodium chloride  3 mL Intravenous Q12H  . tamsulosin  0.4 mg Oral Daily  . tiotropium  18 mcg Inhalation Daily    Continuous Infusions:   Past Medical History  Diagnosis Date  . Hypertension   . Hypercholesteremia   . BPH (benign prostatic hyperplasia)   . ILD (interstitial lung disease)   . COPD, moderate   . Stroke      Thalamic    Past Surgical History  Procedure Laterality Date  . Cataract extraction Bilateral   . Appendectomy    . Hernia repair    . Flexible sigmoidoscopy N/A 04/24/2013    Procedure: FLEXIBLE SIGMOIDOSCOPY;  Surgeon: Shirley Friar, MD;  Location: [facility] ENDOSCOPY;  Service: Endoscopy;  Laterality: N/A;    Magdalen Spatz MS Dietetic Intern Pager Number [phone]

## 2014-08-08 DIAGNOSIS — R0902 Hypoxemia: Secondary | ICD-10-CM | POA: Diagnosis present

## 2014-08-08 DIAGNOSIS — J441 Chronic obstructive pulmonary disease with (acute) exacerbation: Principal | ICD-10-CM

## 2014-08-08 LAB — BASIC METABOLIC PANEL
Anion gap: 9 (ref 5–15)
BUN: 31 mg/dL — AB (ref 6–23)
CHLORIDE: 105 mmol/L (ref 96–112)
CO2: 25 mmol/L (ref 19–32)
Calcium: 9.4 mg/dL (ref 8.4–10.5)
Creatinine, Ser: 1.05 mg/dL (ref 0.50–1.35)
GFR, EST AFRICAN AMERICAN: 71 mL/min — AB (ref >90)
GFR, EST NON AFRICAN AMERICAN: 61 mL/min — AB (ref >90)
GLUCOSE: 132 mg/dL — AB (ref 70–99)
POTASSIUM: 4.6 mmol/L (ref 3.5–5.1)
SODIUM: 139 mmol/L (ref 135–145)

## 2014-08-08 LAB — CBC
HCT: 41.9 % (ref 39.0–52.0)
Hemoglobin: 12.5 g/dL — ABNORMAL LOW (ref 13.0–17.0)
MCH: 22.3 pg — ABNORMAL LOW (ref 26.0–34.0)
MCHC: 29.8 g/dL — ABNORMAL LOW (ref 30.0–36.0)
MCV: 74.7 fL — AB (ref 78.0–100.0)
PLATELETS: 263 10*3/uL (ref 150–400)
RBC: 5.61 MIL/uL (ref 4.22–5.81)
RDW: 18.2 % — ABNORMAL HIGH (ref 11.5–15.5)
WBC: 12 10*3/uL — AB (ref 4.0–10.5)

## 2014-08-08 LAB — LEGIONELLA ANTIGEN, URINE

## 2014-08-08 NOTE — Progress Notes (Signed)
Clinical Social Work Department BRIEF PSYCHOSOCIAL ASSESSMENT 08/08/2014  Patient:  Jim Contreras, Jim Contreras     Account Number:  [id number]     Admit date:  08/06/2014  Clinical Social Worker:  Earlie Server  Date/Time:  08/08/2014 10:30 AM  Referred by:  Physician  Date Referred:  08/08/2014 Referred for  ALF Placement   Other Referral:   Interview type:  Patient Other interview type:    PSYCHOSOCIAL DATA Living Status:  FACILITY Admitted from facility:  [facility] Level of care:  Assisted Living Primary support name:  Clair Gulling Primary support relationship to patient:  CHILD, ADULT Degree of support available:   Strong    CURRENT CONCERNS Current Concerns  Post-Acute Placement   Other Concerns:    SOCIAL WORK ASSESSMENT / PLAN CSW received referral due to patient being admitted from a facility. CSW reviewed chart and met with patient and son at bedside. CSW introduced myself and explained role.    Patient laying in bed and reports he is ready to leave the hospital. CSW explained that MD would decide once he was medically stable. Patient reports he has lived at [facility] for about 1 year and enjoys living there. Patient was using a wheelchair and walker when at ALF and reports he needed assistance as well. Patient reports he worked with PT at the hospital and feels he can return to ALF. Son at bedside agrees with patient and is hopeful that patient can return to ALF.    CSW completed FL2 and placed on chart. CSW left a message at [facility] and will await a return phone call.   Assessment/plan status:  Psychosocial Support/Ongoing Assessment of Needs Other assessment/ plan:   Information/referral to community resources:   Will return to ALF    PATIENT'S/FAMILY'S RESPONSE TO PLAN OF CARE: Patient alert and engaged in assessment. Patient upset that his primary doctor is not following him in the hospital and feels that the hospitalist does not know him  as well. Patient reports he is ready to DC today and does not understand why he has to wait for the doctor to clear him. Patient states he feels much more comfortable at ALF and is familiar with his environment there. Son was able to comfort patient and reassure him that patient will DC once he is stable. Son thanked CSW for visit and asked for updates on DC plans.       [place], [place] [phone]

## 2014-08-08 NOTE — Progress Notes (Signed)
OT Cancellation Note  Patient Details Name: Jim Contreras MRN: [id number] DOB: 1923-07-26   Cancelled Treatment:    Reason Eval/Treat Not Completed: Other (comment) Pt adamantly refuses OT. He states he doesn't need OT and that he showers and does all his self care fine at his ALF. Explained role of OT in the hospital and that the goal is to make sure he can do these tasks currently, but pt still adamantly refuses OT. Will reattempt but if pt continues to decline OT, will sign off.   Lennox LaityStone, Kerith Sherley Stafford  [phone] 08/08/2014, 12:39 PM

## 2014-08-08 NOTE — Progress Notes (Signed)
CARE MANAGEMENT NOTE 08/08/2014  Patient:  Jim Contreras,Jim Contreras   Account Number:  [id number]  Date Initiated:  08/07/2014  Documentation initiated by:  DAVIS,RHONDA  Subjective/Objective Assessment:   pt with hx of copd and increased resp distress and un reposnive to meds over the last several days.  on 5l anjd 02 level 89%     Action/Plan:   snf   Anticipated DC Date:  08/10/2014   Anticipated DC Plan:  SKILLED NURSING FACILITY  In-house referral  Clinical Social Worker      DC Planning Services  CM consult      [facility]   Choice offered to / List presented to:  NA   DME arranged  OTHER - SEE COMMENT      DME agency  [facility]     HH arranged  NA      HH agency  NA   Status of service:  In process, will continue to follow Medicare Important Message given?   (If response is "NO", the following Medicare IM given date fields will be blank) Date Medicare IM given:   Medicare IM given by:   Date Additional Medicare IM given:   Additional Medicare IM given by:    Discharge Disposition:    Per UR Regulation:  Reviewed for med. necessity/level of care/duration of stay  If discussed at Long Length of Stay Meetings, dates discussed:    Comments:  08/08/14 Ferdinand CavaANdrea Schettino RN BSn CM [phone] [facility] and states that he has oxygen there, walker and a wheelchair. He stated that he has used the PT [facility] in the past and if recommended he would like to use their PT services. Will continue to follow for any discharge planning needs.  Feb. 24 2016/Rhonda L. Earlene Plateravis, RN, BSN, CCM. Case Management [facility] [phone] No discharge needs present of time of review. Oximizer requested through [facility] rep. will bring to 1228.

## 2014-08-08 NOTE — Evaluation (Signed)
Physical Therapy Evaluation Patient Details Name: Jim Contreras MRN: [id number] DOB: March 07, 1924 Today's Date: 08/08/2014   History of Present Illness  81 yo male admitted with acute on chronic respiratory failure with hypoxia. hx of HTn, COPD,CVA. Pt is from ALF-wears 4-5L at baseline per pt.   Clinical Impression  On eval, pt required Min assist for mobility-able to ambulate ~60 feet with RW. Remained on 8L [place] oximizer-sats dropped to 77% with ambulation. LOB x 3 during session, even with use of walker. Will follow.     Follow Up Recommendations Home health PT;Supervision/Assistance - 24 hour vs SNF (depending on progress and facillity's ability to provide increased assistance/supervision.)    Equipment Recommendations  None recommended by PT    Recommendations for Other Services OT consult     Precautions / Restrictions Precautions Precautions: Fall Precaution Comments: monitor sats Restrictions Weight Bearing Restrictions: No      Mobility  Bed Mobility Overal bed mobility: Needs Assistance Bed Mobility: Supine to Sit;Sit to Supine     Supine to sit: Supervision Sit to supine: Supervision   General bed mobility comments: supervision for safety, lines  Transfers Overall transfer level: Needs assistance Equipment used: Rolling walker (2 wheeled) Transfers: Sit to/from Stand Sit to Stand: Min assist         General transfer comment: Assist to rise, stabilize, control descent. LOB with initial standing.   Ambulation/Gait Ambulation/Gait assistance: Min assist Ambulation Distance (Feet): 60 Feet Assistive device: Rolling walker (2 wheeled) Gait Pattern/deviations: Step-through pattern;Decreased stride length     General Gait Details: Assist to stabilize throughout ambulation. LOB x2. Remained on [place] O2-8L. Sats 77% once seated back on bed.   Stairs            Wheelchair Mobility    Modified Rankin (Stroke Patients Only)       Balance Overall  balance assessment: Needs assistance         Standing balance support: Bilateral upper extremity supported;During functional activity Standing balance-Leahy Scale: Poor                               Pertinent Vitals/Pain Pain Assessment: No/denies pain    Home Living       Type of Home: Assisted living [facility])       Home Layout: One level Home Equipment: Environmental consultant - 2 wheels      Prior Function                 Hand Dominance        Extremity/Trunk Assessment   Upper Extremity Assessment: Defer to OT evaluation           Lower Extremity Assessment: Generalized weakness      Cervical / Trunk Assessment: Kyphotic  Communication   Communication: HOH  Cognition Arousal/Alertness: Awake/alert Behavior During Therapy: WFL for tasks assessed/performed Overall Cognitive Status: History of cognitive impairments - at baseline                      General Comments      Exercises        Assessment/Plan    PT Assessment Patient needs continued PT services  PT Diagnosis Difficulty walking;Generalized weakness   PT Problem List Decreased strength;Decreased activity tolerance;Decreased balance;Decreased mobility;Cardiopulmonary status limiting activity;Decreased cognition  PT Treatment Interventions DME instruction;Gait training;Functional mobility training;Therapeutic activities;Therapeutic exercise;Patient/family education;Balance training   PT Goals (Current goals can be found  in the Care Plan section) Acute Rehab PT Goals Patient Stated Goal: [facility]  PT Goal Formulation: With patient Time For Goal Achievement: 08/22/14 Potential to Achieve Goals: Good    Frequency Min 3X/week   Barriers to discharge        Co-evaluation               End of Session Equipment Utilized During Treatment: Gait belt;Oxygen Activity Tolerance: Patient limited by fatigue (Limited by drop in sats,  dyspnea) Patient left: in bed;with call bell/phone within reach;with family/visitor present;with bed alarm set           Time: 1000-1027 PT Time Calculation (min) (ACUTE ONLY): 27 min   Charges:   PT Evaluation $Initial PT Evaluation Tier I: 1 Procedure PT Treatments $Gait Training: 8-22 mins   PT G Codes:        Rebeca AlertJannie Letrice Pollok, MPT Pager: [phone]

## 2014-08-08 NOTE — Progress Notes (Signed)
[facility] PROGRESS NOTE  Jim Contreras ZOX:[id number] DOB: 1923-12-04 DOA: 08/06/2014 PCP: Laurell JosephsMorrow, Aaron P, MD  Assessment/Plan: #1 acute on chronic respiratory failure/chronic interstitial lung disease Likely secondary to progressive worsening interstitial lung disease versus flare of interstitial lung disease. Patient currently on about 6 L of nasal cannula. Patient speaking in full sentences. Patient denies any shortness of breath or chest pain. Continue current empiric IV steroids and transitioned to oral steroid taper, oxygen, scheduled nebulizers, pulmonary toilet, Mucinex. Patient has been seen by pulmonary and critical care medicine and patient has progressive chronic respiratory failure secondary to COPD, IPF which has progressed. It is felt that patient has an expected life expectancy of a few to several months. Patient has been placed on the Oxymizer, to support oxygen sats greater than 75%, Flonase. Palliative care consultation has been ordered and is pending for goals of care. Outpatient follow-up with pulmonary.   #2 gastroesophageal reflux disease PPI.  #3 dyslipidemia Continue statin.  #4 BPH Continue Proscar and Avodart and Flomax.  #5 hypertension Stable. Continue Proscar and Flomax.  #6 prophylaxis PPI for GI prophylaxis. Heparin for DVT prophylaxis.  #7 prognosis Patient with worsening progressive chronic respiratory failure likely secondary to her progressive IPF. Patient with an expected life expectancy a few to several months per pulmonary and critical care medicine. Palliative care consultation pending for goals of care.  Code Status: DO NOT RESUSCITATE Family Communication: Updated patient, no family at bedside. Dr. Marchelle Gearingamaswamy updated family. Disposition Plan: Remain inpatient. Palliative care consultation pending.   Consultants:  PCCM Dr. Marchelle Gearingamaswamy 08/07/2014  Procedures:  Chest x-ray  08/06/2014  Antibiotics:  None  HPI/Subjective: Patient denies any SOB, no chest pain.  Objective: Filed Vitals:   08/08/14 1337  BP: 148/57  Pulse: 72  Temp: 98.1 F (36.7 C)  Resp: 18    Intake/Output Summary (Last 24 hours) at 08/08/14 1519 Last data filed at 08/08/14 0427  Gross per 24 hour  Intake      0 ml  Output    925 ml  Net   -925 ml   Filed Weights   08/07/14 0700  Weight: 67.8 kg (149 lb 7.6 oz)    Exam:   General:  NAD  Cardiovascular: RRR  Respiratory: CTAB.Decreased BS in bases.  Abdomen: Soft/NT/ND/+BS  Musculoskeletal: No c/c/e  Data Reviewed: Basic Metabolic Panel:  Recent Labs Lab 08/06/14 1742 08/07/14 0755 08/08/14 0510  NA 141 141 139  K 4.1 4.1 4.6  CL 108 111 105  CO2 25 22 25   GLUCOSE 124* 169* 132*  BUN 24* 20 31*  CREATININE 1.03 0.88 1.05  CALCIUM 9.0 8.4 9.4   Liver Function Tests:  Recent Labs Lab 08/06/14 1742 08/07/14 0755  AST 25 18  ALT 16 14  ALKPHOS 61 52  BILITOT 0.3 0.4  PROT 6.9 6.3  ALBUMIN 3.6 3.3*   No results for input(s): LIPASE, AMYLASE in the last 168 hours. No results for input(s): AMMONIA in the last 168 hours. CBC:  Recent Labs Lab 08/06/14 1742 08/07/14 0755 08/08/14 0510  WBC 8.2 4.7 12.0*  NEUTROABS 5.4 4.0  --   HGB 12.7* 12.1* 12.5*  HCT 43.3 39.5 41.9  MCV 74.9* 74.7* 74.7*  PLT 264 243 263   Cardiac Enzymes:  Recent Labs Lab 08/06/14 1745  TROPONINI 0.03   BNP (last 3 results)  Recent Labs  08/06/14 1745  BNP 77.7    ProBNP (last 3 results)  Recent Labs  03/24/14 2158  PROBNP  1012.0*    CBG:  Recent Labs Lab 08/06/14 1722  GLUCAP 127*    Recent Results (from the past 240 hour(s))  MRSA PCR Screening     Status: None   Collection Time: 08/06/14 10:42 PM  Result Value Ref Range Status   MRSA by PCR NEGATIVE NEGATIVE Final    Comment:        The GeneXpert MRSA Assay (FDA approved for NASAL specimens only), is one component of  a comprehensive MRSA colonization surveillance program. It is not intended to diagnose MRSA infection nor to guide or monitor treatment for MRSA infections.      Studies: Dg Chest Port 1 View  08/06/2014   CLINICAL DATA:  Acute dyspnea.  EXAM: PORTABLE CHEST - 1 VIEW  COMPARISON:  May 25, 2014.  FINDINGS: Stable cardiomediastinal silhouette. No pneumothorax or plural effusion is noted. Stable emphysematous change and scarring is noted in right upper lobe. Stable reticular and interstitial densities are noted in both lower lobes most consistent with scarring. Bony thorax is intact.  IMPRESSION: Stable probable bibasilar scarring. Stable emphysematous change in right upper lobe. No acute cardiopulmonary abnormality seen.   Electronically Signed   By: Lupita Raider, M.D.   On: 08/06/2014 18:40    Scheduled Meds: . aspirin  81 mg Oral Daily  . dutasteride  0.5 mg Oral Daily  . feeding supplement (ENSURE COMPLETE)  237 mL Oral TID BM  . fluticasone  2 spray Each Nare Daily  . guaiFENesin  600 mg Oral BID  . heparin  5,000 Units Subcutaneous 3 times per day  . methylPREDNISolone (SOLU-MEDROL) injection  60 mg Intravenous Q24H  . pantoprazole  40 mg Oral Daily  . pravastatin  40 mg Oral Daily  . sodium chloride  2 spray Each Nare BID  . sodium chloride  3 mL Intravenous Q12H  . tamsulosin  0.4 mg Oral Daily  . tiotropium  18 mcg Inhalation Daily   Continuous Infusions:   Principal Problem:   Acute on chronic respiratory failure with hypoxia Active Problems:   ILD (interstitial lung disease)   Pulmonary hypertension   Aortic stenosis   Hypertension   DNR (do not resuscitate)   IPF (idiopathic pulmonary fibrosis)   Emphysema lung   Esophageal reflux    Time spent: 40 mins    [facility] MD [facility] Pager [phone]. If 7PM-7AM, please contact night-coverage at [email or web address], password [facility] 08/08/2014, 3:19 PM  LOS: 2 days

## 2014-08-08 NOTE — Consult Note (Signed)
Name: Georgina QuintJames R Cogan MRN: [id number] DOB: 03-19-1924    ADMISSION DATE:  08/06/2014 CONSULTATION DATE:  08/07/14  REFERRING MD :  Dr. Janee Mornhompson   CHIEF COMPLAINT:  Hypoxia / Chronic Respiratory Failure   BRIEF PATIENT DESCRIPTION:  81 y/o M, ALF Resident, former smoker (40+ years 1.5 ppd) with PMH of Aortic Stenosis, HTN, HLD, BPH, CVA, PAH, ILD/NSIP with lower lobe reticular/nodular distribution (atypical for UIP), 4-5 L O2 dependent COPD and recent admits for LLL PNA who [facility] on [facility] from ALF with reports of stuffy nose x 3 wks and desaturations with activity.  Daughter reports the patient is wheelchair bound at baseline due to dyspnea / deconditioning.  He is able to transfer from chair to bed with assistance (walker + staff).  At the ALF, he was recently re-enrolled in a PT program.  During PT, he was noted to desaturate into the 70's with activity.  The patient previously had an oxymizer but it apparently has been misplaced at ALF.    Exam notes reflect patient was oriented on arrival but found to have low O2 saturations on 4L (84%).  He reported a 3 week hx of stuffy nose.  He denied other associated symptoms - worsening of SOB, DOE, chest pain, chest pain with inspiration, n/v, abdominal pain.  He has been evaluated by SLP in the recent past and was recommended to slow down with eating but not specific dietary changes required.  During last hospitalization, he was evaluated for hospice care and family turned down at the time.    Patient was admitted per TRH, treated with IV steroids, nebulized bronchodilators, empiric antibiotics and increased O2.   ABG assessed 2/23 showed ph 7.399 / 38 / 69 / 23. CXR without acute infiltrate.  PCCM consulted for evaluation of hypoxia on 2/24.    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      SIGNIFICANT EVENTS  2/23  Admit with hypoxia (84% on 4L  O2).  Desats at ALF with PT  2/23  CXR >> no acute abnormality, stable basilar scarring noted  SUBJECTIVE:  08/08/14: walked with PT with pendant o2 and at end of it pulse ox 77% and rose with rest. Deconditioned but able to walk with walker and PT. Son at bedside. Denied he took morphine yesterday for dyspnea but records indicate he did accept it. Not sure if this helped dyspnea and cough. All he answers is < " I do not want addictive stuff". Daughter described him yeserday as stubborn. Son very open to palliative consult.    VITAL SIGNS: Temp:  [97.1 F (36.2 C)-97.6 F (36.4 C)] 97.6 F (36.4 C) (02/25 0410) Pulse Rate:  [65-73] 65 (02/25 0410) Resp:  [17-20] 20 (02/25 0410) BP: (143-170)/(61-72) 170/72 mmHg (02/25 0410) SpO2:  [82 %-97 %] 90 % (02/25 0828)  PHYSICAL EXAMINATION: General:  Frail elderly male in NAD Neuro:  AAOx4, speech clear, MAE  HEENT:  MM pink/moist, no jvd  Cardiovascular:  s1s2 rrr, SEM 2/6 heard best 2nd ICS RSB Lungs:  resp's even/non-labored, lungs bilaterally diminished but essentially clear, moist cough but not productive  Abdomen:  NTND, BSx4 active  Musculoskeletal:  No acute deformities  Skin:  Warm/dry, no edema    Recent Labs Lab 08/06/14 1742 08/07/14 0755 08/08/14 0510  NA 141 141 139  K 4.1 4.1 4.6  CL 108 111 105  CO2 25  22 25  BUN 24* 20 31*  CREATININE 1.03 0.88 1.05  GLUCOSE 124* 169* 132*    Recent Labs Lab 08/06/14 1742 08/07/14 0755 08/08/14 0510  HGB 12.7* 12.1* 12.5*  HCT 43.3 39.5 41.9  WBC 8.2 4.7 12.0*  PLT 264 243 263   Dg Chest Port 1 View  08/06/2014   CLINICAL DATA:  Acute dyspnea.  EXAM: PORTABLE CHEST - 1 VIEW  COMPARISON:  May 25, 2014.  FINDINGS: Stable cardiomediastinal silhouette. No pneumothorax or plural effusion is noted. Stable emphysematous change and scarring is noted in right upper lobe. Stable reticular and interstitial densities are noted in both lower lobes most consistent with scarring.  Bony thorax is intact.  IMPRESSION: Stable probable bibasilar scarring. Stable emphysematous change in right upper lobe. No acute cardiopulmonary abnormality seen.   Electronically Signed   By: Lupita Raider, M.D.   On: 08/06/2014 18:40    ASSESSMENT / PLAN:  IPF / NSIP  COPD - PFT's in 10/2012 showed moderated obstruction / restriction, significant reduction in DLCO  PAH  Hypoxia  Mild AS    - he has progressive chronic resp failure due to his copd.ipf and likely that his IPF has progressed. His expected life expectancy is few to several months. He might have Aecopd or AEIPF but this is looking more like chronic progressive hypoxemic resp failure. Palliation best option. Son and duaghter very open to idea of enhancing quality of life and home hospice. Patient might be reluctant. He is hard to convince even about benefits of oral morphine syrup  Plan: O2 to support saturations  > 75% is good enough Obtain oxymizer for O2 delivery, inpatient titration of O2 Solumedrol 60 mg Q 24h and [facility] to start po 08/09/14 and taper off over 12 days PPI   Nasal hygiene:  Nasal saline spray, flonase  Pulmonary hygiene DNR / DNI  PT consult - titrate O2 oxymizer  Follow up with Pulmonary as outpatient upon discharge. - Dr Gaspar Bidding - NP Tammy - Friday 08/23/14 at 15.15 Follow prior diet / aspiration precaution recommendations Morphine for dyspnea, cough but he is reluctant  Palliative medicine consult ordered for goals of care and explanation of hospice benefits and symptom mgmt  PCCM will sign off   SOn updated - explained I was a Corinda Gubler PCCM MD   Future Appointments Date Time Provider Department Center  08/23/2014 3:15 PM Tammy Rogers Seeds, NP [facility] None      Dr. Kalman Shan, M.D., [facility].C.P Pulmonary and Critical Care Medicine Staff Physician [facility] Pager: [phone], If no answer or between  15:00h - 7:00h: call [phone]  08/08/2014 10:44 AM

## 2014-08-09 DIAGNOSIS — R0602 Shortness of breath: Secondary | ICD-10-CM

## 2014-08-09 DIAGNOSIS — Z515 Encounter for palliative care: Secondary | ICD-10-CM

## 2014-08-09 LAB — BASIC METABOLIC PANEL
Anion gap: 10 (ref 5–15)
BUN: 35 mg/dL — AB (ref 6–23)
CO2: 26 mmol/L (ref 19–32)
CREATININE: 1.01 mg/dL (ref 0.50–1.35)
Calcium: 9 mg/dL (ref 8.4–10.5)
Chloride: 105 mmol/L (ref 96–112)
GFR calc Af Amer: 74 mL/min — ABNORMAL LOW (ref >90)
GFR, EST NON AFRICAN AMERICAN: 64 mL/min — AB (ref >90)
GLUCOSE: 139 mg/dL — AB (ref 70–99)
Potassium: 5 mmol/L (ref 3.5–5.1)
Sodium: 141 mmol/L (ref 135–145)

## 2014-08-09 MED ORDER — TRAMADOL-ACETAMINOPHEN 37.5-325 MG PO TABS: 1.0000 | ORAL_TABLET | Freq: Three times a day (TID) | ORAL | Status: AC | PRN

## 2014-08-09 MED ORDER — GUAIFENESIN ER 600 MG PO TB12: 600.0000 mg | ORAL_TABLET | Freq: Two times a day (BID) | ORAL | Status: AC

## 2014-08-09 MED ORDER — FLUTICASONE PROPIONATE 50 MCG/ACT NA SUSP: 2.0000 | Freq: Every day | NASAL | Status: AC

## 2014-08-09 MED ORDER — PREDNISONE 50 MG PO TABS
60.0000 mg | ORAL_TABLET | Freq: Every day | ORAL | Status: DC
  Administered 2014-08-09: 60 mg via ORAL
  Filled 2014-08-09 (×2): qty 1

## 2014-08-09 MED ORDER — MORPHINE SULFATE (CONCENTRATE) 10 MG /0.5 ML PO SOLN: 5.0000 mg | ORAL | Status: AC | PRN

## 2014-08-09 MED ORDER — LORAZEPAM 2 MG/ML PO CONC: 1.0000 mg | Freq: Four times a day (QID) | ORAL | Status: AC | PRN

## 2014-08-09 MED ORDER — SALINE SPRAY 0.65 % NA SOLN: 2.0000 | Freq: Two times a day (BID) | NASAL | Status: AC

## 2014-08-09 MED ORDER — PREDNISONE 20 MG PO TABS: 60.0000 mg | ORAL_TABLET | Freq: Every day | ORAL | Status: AC

## 2014-08-09 NOTE — Discharge Summary (Signed)
Physician Discharge Summary  GERHARDT GLEED ZOX:[id number] DOB: 23-Dec-1923 DOA: 08/06/2014  PCP: [name], MD  Admit date: 08/06/2014 Discharge date: 08/09/2014  Time spent: 70 minutes  Recommendations for Outpatient Follow-up:  1. Patient will be discharged home with hospice with goal oxygen sats greater than 75%. 2. Follow-up with Tammy., NP pulmonary 08/22/2004 at 3:15 PM.  Discharge Diagnoses:  Principal Problem:   Acute on chronic respiratory failure with hypoxia Active Problems:   ILD (interstitial lung disease)   Pulmonary hypertension   Aortic stenosis   Hypertension   DNR (do not resuscitate)   IPF (idiopathic pulmonary fibrosis)   Emphysema lung   Esophageal reflux   Acute exacerbation of chronic obstructive pulmonary disease (COPD)   Hypoxia   Discharge Condition: Stable  Diet recommendation: Regular  Filed Weights   08/07/14 0700  Weight: 67.8 kg (149 lb 7.6 oz)    History of present illness:  Jim Contreras is a 81 y.o. male with Past medical history of hypertension, interstitial lung disease, COPD, CVA, BPH, chronic respiratory failure on 4-5 L of oxygen. The patient presented with progressively worsening headache and stuffy nose. Patient was brought from nursing home with the complaints of change in mental status with the patient is very well aware and oriented of his surroundings and was able to tell me complete history. He has noted for last 3 weeks stuffy nose but did not have any cough is not have any more shortness of breath did not have any pain in his chest or abdominal pain, denied any nausea or vomiting denied any acid reflux denied any burning urination. Complained of occasional loose stool but normal regular diarrhea or blood in his bowels. Denied any recent changes in his medications. Denied any swelling of his legs or any focal deficit. As per last documentation the patient was supposed to be on 4 L of oxygen at rest and 5 L of oxygen at  exertion. Patient was seen saturating 84% on 4 L of oxygen. During his last admission the patient was seen by palliative care for possible hospice due to his end-stage ILD, but due to the improvement in his symptoms patient and the family decided to hold off on hospice.   Hospital Course:  #1 acute on chronic respiratory failure/chronic interstitial lung disease Patient was admitted with hypoxia with sats of 84% on 4 L  With upper respiratory patient was admitted to the step down unit and monitored closely. Likely secondary to progressive worsening interstitial lung disease versus flare of interstitial lung disease with a component of COPD. Patient was requiring 6 L of nasal cannula. Patient speaking in full sentences.  patient was placed on IV steroids, scheduled nebulizers, pulmonary toilet, Mucinex. Pulmonary consultation was obtained and patient was seen by Dr. Marchelle Gearing and followed throughout the hospitalization. Patient improved clinically and denied any shortness of breath or chest pain. It was felt per pulmonary that patient's progressive chronic respiratory failure was likely secondary to progressive IPF. It was felt patient's life expectancy was a few to several months. It was felt that probably she needed to be the best option. Pulmonary M.D. discuss this with patient and his family underwent agreement. Patient's IV steroids was subsequently transitioned to oral steroid taper. Patient was maintained on oxygen with the Oxymizer , scheduled nebulizers, pulmonary toilet, Mucinex. Patient has been seen by pulmonary and critical care medicine and patient has progressive chronic respiratory failure secondary to COPD, IPF which has progressed. It is felt that patient  has an expected life expectancy of a few to several months. Patient has been placed on the Oxymizer, to support oxygen sats greater than 75%, Flonase. Palliative care consultation has been  obtained and patient was seen in consultation by  palliative care on 08/09/2014. Goals of care were discussed and Asian is a DO NOT RESUSCITATE. Patient will be discharged home with hospice. Patient will follow-up with pulmonary as outpatient. Patient be discharged in stable and improved condition.  #2 gastroesophageal reflux disease Remained on PPI.  #3 dyslipidemia Continued on statin. Statin d/c'd on discharge.  #4 BPH Continued on Proscar and Avodart and Flomax.  #5 hypertension Stable. Continued on Proscar and Flomax.  #6 prophylaxis PPI for GI prophylaxis. Heparin for DVT prophylaxis.  #7 prognosis Patient with worsening progressive chronic respiratory failure likely secondary to her progressive IPF. Patient with an expected life expectancy a few to several months per pulmonary and critical care medicine. Palliative care consultation pending for goals of care.   Procedures:  Chest x-ray 08/06/2014    Consultations:  PCCM Dr. Marchelle Gearing 08/07/2014  Palliative Care ; Yong Channel 08/09/14  Discharge Exam: Filed Vitals:   08/09/14 0623  BP: 136/64  Pulse:   Temp:   Resp:     General: NAD Cardiovascular: RRR Respiratory: CTAB  Discharge Instructions   Discharge Instructions    Diet general    Complete by:  As directed      Discharge instructions    Complete by:  As directed   Follow up with Rubye Oaks, NP Pulmonary on 08/23/14 at 3:15pm.     Increase activity slowly    Complete by:  As directed           Current Discharge Medication List    START taking these medications   Details  fluticasone (FLONASE) 50 MCG/ACT nasal spray Place 2 sprays into both nostrils daily. Qty: 16 g, Refills: 0    sodium chloride (OCEAN) 0.65 % SOLN nasal spray Place 2 sprays into both nostrils 2 (two) times daily. Qty: 30 mL, Refills: 0      CONTINUE these medications which have CHANGED   Details  guaiFENesin (MUCINEX) 600 MG 12 hr tablet Take 1 tablet (600 mg total) by mouth 2 (two) times daily. Take for 5  days. Qty: 10 tablet, Refills: 0    LORazepam (ATIVAN) 2 MG/ML concentrated solution Take 0.5 mLs (1 mg total) by mouth every 6 (six) hours as needed for anxiety. Qty: 30 mL, Refills: 0    Morphine Sulfate (MORPHINE CONCENTRATE) 10 mg / 0.5 ml concentrated solution Take 0.25 mLs (5 mg total) by mouth every 3 (three) hours as needed for severe pain, anxiety or shortness of breath. Qty: 30 mL, Refills: 0    predniSONE (DELTASONE) 20 MG tablet Take 3 tablets (60 mg total) by mouth daily before breakfast. Take 3 tablets ( ) x 3 days, then 2 tablets ( ) x 4 days, then 1 tablet ( ) x 4 days, then 1/2 tablet ( 0 x 3 days then stop. Qty: 24 tablet, Refills: 0    traMADol-acetaminophen (ULTRACET) 37.5-325 MG per tablet Take 1 tablet by mouth every 8 (eight) hours as needed for moderate pain or severe pain. Qty: 20 tablet, Refills: 0      CONTINUE these medications which have NOT CHANGED   Details  albuterol (PROVENTIL HFA;VENTOLIN HFA) 108 (90 BASE) MCG/ACT inhaler Inhale 1 puff into the lungs every 6 (six) hours as needed for wheezing or shortness of breath.    albuterol (  PROVENTIL) (2.5 MG/3ML) 0.083% nebulizer solution Take 3 mLs (2.5 mg total) by nebulization every 4 (four) hours as needed for wheezing or shortness of breath. Qty: 75 mL, Refills: 12    aspirin 81 MG tablet Take 81 mg by mouth daily.    dutasteride (AVODART) 0.5 MG capsule Take 0.5 mg by mouth daily.    feeding supplement, ENSURE COMPLETE, (ENSURE COMPLETE) LIQD Take 237 mLs by mouth 2 (two) times daily between meals. Qty: 237 mL, Refills: 3    finasteride (PROSCAR) 5 MG tablet Take 5 mg by mouth daily.    lansoprazole (PREVACID) 30 MG capsule Take 30 mg by mouth daily at 12 noon.    Tamsulosin HCl (FLOMAX) 0.4 MG CAPS Take 0.4 mg by mouth daily.    tiotropium (SPIRIVA) 18 MCG inhalation capsule Place 18 mcg into inhaler and inhale daily.    traZODone (DESYREL) 50 MG tablet Take 1 tablet (50 mg total) by  mouth at bedtime as needed for sleep. Qty: 30 tablet, Refills: 2      STOP taking these medications     pravastatin (PRAVACHOL) 40 MG tablet      benzonatate (TESSALON) 100 MG capsule      doxycycline (VIBRAMYCIN) 100 MG capsule        Allergies  Allergen Reactions  . Floxin [Ofloxacin] Other (See Comments)    Lost feeling in feet  . Penicillins Other (See Comments)    Ulcers in mouth  . Sulfa Antibiotics Other (See Comments)    Ulcers in mouth   Follow-up Information    Follow up with PARRETT,TAMMY, NP On 08/23/2014.   Specialty:  Nurse Practitioner   Why:  f/u at 3:15pm   Contact information:   [address] [place] [address] [phone]        The results of significant diagnostics from this hospitalization (including imaging, microbiology, ancillary and laboratory) are listed below for reference.    Significant Diagnostic Studies: Dg Chest Port 1 View  08/06/2014   CLINICAL DATA:  Acute dyspnea.  EXAM: PORTABLE CHEST - 1 VIEW  COMPARISON:  May 25, 2014.  FINDINGS: Stable cardiomediastinal silhouette. No pneumothorax or plural effusion is noted. Stable emphysematous change and scarring is noted in right upper lobe. Stable reticular and interstitial densities are noted in both lower lobes most consistent with scarring. Bony thorax is intact.  IMPRESSION: Stable probable bibasilar scarring. Stable emphysematous change in right upper lobe. No acute cardiopulmonary abnormality seen.   Electronically Signed   By: Lupita RaiderJames  Green Jr, M.D.   On: 08/06/2014 18:40    Microbiology: Recent Results (from the past 240 hour(s))  MRSA PCR Screening     Status: None   Collection Time: 08/06/14 10:42 PM  Result Value Ref Range Status   MRSA by PCR NEGATIVE NEGATIVE Final    Comment:        The GeneXpert MRSA Assay (FDA approved for NASAL specimens only), is one component of a comprehensive MRSA colonization surveillance program. It is not intended to diagnose  MRSA infection nor to guide or monitor treatment for MRSA infections.      Labs: Basic Metabolic Panel:  Recent Labs Lab 08/06/14 1742 08/07/14 0755 08/08/14 0510 08/09/14 0530  NA 141 141 139 141  K 4.1 4.1 4.6 5.0  CL 108 111 105 105  CO2 25 22 25 26   GLUCOSE 124* 169* 132* 139*  BUN 24* 20 31* 35*  CREATININE 1.03 0.88 1.05 1.01  CALCIUM 9.0 8.4 9.4 9.0  Liver Function Tests:  Recent Labs Lab 08/06/14 1742 08/07/14 0755  AST 25 18  ALT 16 14  ALKPHOS 61 52  BILITOT 0.3 0.4  PROT 6.9 6.3  ALBUMIN 3.6 3.3*   No results for input(s): LIPASE, AMYLASE in the last 168 hours. No results for input(s): AMMONIA in the last 168 hours. CBC:  Recent Labs Lab 08/06/14 1742 08/07/14 0755 08/08/14 0510  WBC 8.2 4.7 12.0*  NEUTROABS 5.4 4.0  --   HGB 12.7* 12.1* 12.5*  HCT 43.3 39.5 41.9  MCV 74.9* 74.7* 74.7*  PLT 264 243 263   Cardiac Enzymes:  Recent Labs Lab 08/06/14 1745  TROPONINI 0.03   BNP: BNP (last 3 results)  Recent Labs  08/06/14 1745  BNP 77.7    ProBNP (last 3 results)  Recent Labs  03/24/14 2158  PROBNP 1012.0*    CBG:  Recent Labs Lab 08/06/14 1722  GLUCAP 127*       Signed:  THOMPSON,DANIEL MD [facility] 08/09/2014, 1:52 PM

## 2014-08-09 NOTE — Consult Note (Signed)
Patient LO:VFIEP R Ildefonso      DOB: [birth year]      PIR:[id number]     Consult Note from the Palliative Medicine Team at [facility] Requested by: Dr. Chase Caller     PCP: Juanell Fairly, MD Reason for Consultation: [place], hospice     Phone Number:[phone]  Assessment of patients Current state: I met today with Mr. Radigan along with his daughter and son at bedside. I talked with them about hospice and how they can help keep him comfortable without bringing him back to the hospital when has has issues with his breathing. He does not want to be in the hospital and says he is more comfortable at [facility]. We discussed how hospice can help and what they provide. They all agree this will be helpful. We also discussed roxanol to relieve shortness of breath if needed and Mr. Alcindor is agreeable at this time. He will likely need reinforcement from hospice. He denies the fact that he is more wheelchair bound and needs more assistance than previously - this will be a challenge.    Goals of Care: 1.  Code Status: DNR   2. Disposition: Return to [facility] with hospice in place.    3. Symptom Management:   1. Shortness of breath: Roxanol 5 mg every 3 hours prn.  2. Anxiety: Lorazepam 1 mg every 6 hours prn. Anxiety is likely r/t to shortness of breath.   4. Psychosocial: Emotional support provided to patient and to family.    Brief HPI: 81 yo male admitted with shortness of breath and nasal stuffiness r/t worsening ILD and COPD. He uses 4-5L oxygen at home. He was given increased oxygen with use of oxymizer and placed on IV steroids that are being tapered. Past medical history of hypertension, interstitial lung disease, COPD, CVA, BPH, chronic respiratory failure. PMH reviewed below.    ROS: Denies pain, SOB, chest pain, constipation    PMH:  Past Medical History  Diagnosis Date  . Hypertension   . Hypercholesteremia   . BPH (benign prostatic hyperplasia)    . ILD (interstitial lung disease)   . COPD, moderate   . Stroke     Thalamic     PSH: Past Surgical History  Procedure Laterality Date  . Cataract extraction Bilateral   . Appendectomy    . Hernia repair    . Flexible sigmoidoscopy N/A 04/24/2013    Procedure: FLEXIBLE SIGMOIDOSCOPY;  Surgeon: Lear Ng, MD;  Location: [facility];  Service: Endoscopy;  Laterality: N/A;   I have reviewed the [place] and SH and  If appropriate update it with new information. Allergies  Allergen Reactions  . Floxin [Ofloxacin] Other (See Comments)    Lost feeling in feet  . Penicillins Other (See Comments)    Ulcers in mouth  . Sulfa Antibiotics Other (See Comments)    Ulcers in mouth   Scheduled Meds: . aspirin  81 mg Oral Daily  . dutasteride  0.5 mg Oral Daily  . feeding supplement (ENSURE COMPLETE)  237 mL Oral TID BM  . fluticasone  2 spray Each Nare Daily  . guaiFENesin  600 mg Oral BID  . heparin  5,000 Units Subcutaneous 3 times per day  . pantoprazole  40 mg Oral Daily  . pravastatin  40 mg Oral Daily  . predniSONE  60 mg Oral QAC breakfast  . sodium chloride  2 spray Each Nare BID  . sodium chloride  3 mL  Intravenous Q12H  . tamsulosin  0.4 mg Oral Daily  . tiotropium  18 mcg Inhalation Daily   Continuous Infusions:  PRN Meds:.acetaminophen **OR** acetaminophen, albuterol, ondansetron **OR** ondansetron (ZOFRAN) IV, traMADol-acetaminophen    BP 136/64 mmHg  Pulse 50  Temp(Src) 97.9 F (36.6 C) (Oral)  Resp 18  Ht _0  (1.702 m)  Wt 67.8 kg (149 lb 7.6 oz)  BMI 23.41 kg/m2  SpO2 90%   PPS: 30%   Intake/Output Summary (Last 24 hours) at 08/09/14 1140 Last data filed at 08/08/14 1420  Gross per 24 hour  Intake    240 ml  Output      0 ml  Net    240 ml    Physical Exam:  General: NAD, elderly, pleasant HEENT: [place]/AT, no JVD, moist mucous membranes Chest: No labored breathing, symmetric CVS: RRR Abdomen: Soft, NT, ND Ext: MAE, no edema, warm to  touch Neuro: Awake, alert, oriented x 3  Labs: CBC    Component Value Date/Time   WBC 12.0* 08/08/2014 0510   RBC 5.61 08/08/2014 0510   HGB 12.5* 08/08/2014 0510   HCT 41.9 08/08/2014 0510   PLT 263 08/08/2014 0510   MCV 74.7* 08/08/2014 0510   MCH 22.3* 08/08/2014 0510   MCHC 29.8* 08/08/2014 0510   RDW 18.2* 08/08/2014 0510   LYMPHSABS 0.6* 08/07/2014 0755   MONOABS 0.1 08/07/2014 0755   EOSABS 0.0 08/07/2014 0755   BASOSABS 0.0 08/07/2014 0755    BMET    Component Value Date/Time   NA 141 08/09/2014 0530   K 5.0 08/09/2014 0530   CL 105 08/09/2014 0530   CO2 26 08/09/2014 0530   GLUCOSE 139* 08/09/2014 0530   BUN 35* 08/09/2014 0530   CREATININE 1.01 08/09/2014 0530   CALCIUM 9.0 08/09/2014 0530   GFRNONAA 64* 08/09/2014 0530   GFRAA 74* 08/09/2014 0530    CMP     Component Value Date/Time   NA 141 08/09/2014 0530   K 5.0 08/09/2014 0530   CL 105 08/09/2014 0530   CO2 26 08/09/2014 0530   GLUCOSE 139* 08/09/2014 0530   BUN 35* 08/09/2014 0530   CREATININE 1.01 08/09/2014 0530   CALCIUM 9.0 08/09/2014 0530   PROT 6.3 08/07/2014 0755   ALBUMIN 3.3* 08/07/2014 0755   AST 18 08/07/2014 0755   ALT 14 08/07/2014 0755   ALKPHOS 52 08/07/2014 0755   BILITOT 0.4 08/07/2014 0755   GFRNONAA 64* 08/09/2014 0530   GFRAA 74* 08/09/2014 0530     Time In Time Out Total Time Spent with Patient Total Overall Time  1045 1145 33mn 614m    Greater than 50%  of this time was spent counseling and coordinating care related to the above assessment and plan.  AlVinie SillNP Palliative Medicine Team Pager # [phone]-F 8a-5p) Team Phone # [phone]/Weekends)

## 2014-08-09 NOTE — Progress Notes (Signed)
OT Cancellation Note  Patient Details Name: Jim Contreras MRN: [id number] DOB: Dec 04, 1923   Cancelled Treatment:    Reason Eval/Treat Not Completed: Patient declined, stating that he does not need OT. OT explained benefits/purpose of OT, however pt conituned to politely decline  Galen ManilaSpencer, Aminah Zabawa Jeanette 08/09/2014, 8:51 AM

## 2014-08-09 NOTE — Progress Notes (Signed)
Discharge instructions and medications reviewed with patient. Patient verbalizes understanding and has no questions at this time. [facility]

## 2014-08-09 NOTE — Progress Notes (Signed)
Clinical Social Work  CS[facility] and [facility] today. ALF aware of oxygen needs with oximizer and agreeable to arrange hospice services. CSW informed patient and son of DC plans and patient reports he is happy to "get out of this place." Patient and family prefer [facility] to provide transportation and aware of no guarantee of payment. [facility]. DC packet prepared with DC summary, FL2, DNR, MOST form, and hard scripts included.  [facility] arranged, request #: [id number].  CSW is signing off but available if needed.  [place] End[place]olly Melanye Hiraldo, [place] [phone]

## 2014-08-12 ENCOUNTER — Telehealth: Payer: Self-pay | Admitting: Critical Care Medicine

## 2014-08-12 NOTE — Telephone Encounter (Signed)
Sp[facility] with [facility] at [facility], states that PCP had referred patient to hospice pt.  Pt is being referred for COPD and they are requesting ov notes to support his diagnosis.    Records need to be fax[facility] to [facility] at [phone] attn: Drenda FreezeFran  These have been faxed.  Nothing further needed.

## 2014-08-16 NOTE — Progress Notes (Signed)
Clinical Social Work  CSW followed up with [employer] re: ambulance authorization. [employer] reports they never received fax from last week. CSW re-faxed information with fax confirmation and will await auth.  [place] Kal[place], [place] [phone]

## 2014-08-23 ENCOUNTER — Inpatient Hospital Stay: Payer: Medicare Other | Admitting: Adult Health

## 2015-04-15 DEATH — deceased
